# Patient Record
Sex: Female | Born: 1960
Health system: Southern US, Community
[De-identification: ages and names within clinical notes are randomized; demographics above are authoritative.]

## PROBLEM LIST (undated history)

## (undated) DIAGNOSIS — C801 Malignant (primary) neoplasm, unspecified: Secondary | ICD-10-CM

## (undated) DIAGNOSIS — M810 Age-related osteoporosis without current pathological fracture: Secondary | ICD-10-CM

## (undated) DIAGNOSIS — J449 Chronic obstructive pulmonary disease, unspecified: Secondary | ICD-10-CM

## (undated) DIAGNOSIS — G8250 Quadriplegia, unspecified: Secondary | ICD-10-CM

## (undated) DIAGNOSIS — T7840XA Allergy, unspecified, initial encounter: Secondary | ICD-10-CM

## (undated) DIAGNOSIS — J439 Emphysema, unspecified: Secondary | ICD-10-CM

## (undated) HISTORY — DX: Malignant (primary) neoplasm, unspecified: C80.1

## (undated) HISTORY — DX: Age-related osteoporosis without current pathological fracture: M81.0

## (undated) HISTORY — DX: Allergy, unspecified, initial encounter: T78.40XA

## (undated) HISTORY — PX: SPINE SURGERY: SHX786

## (undated) HISTORY — DX: Quadriplegia, unspecified: G82.50

## (undated) HISTORY — PX: CERVICAL SPINE SURGERY: SHX589

## (undated) HISTORY — PX: ABDOMINAL HYSTERECTOMY: SHX81

## (undated) HISTORY — DX: Chronic obstructive pulmonary disease, unspecified: J44.9

## (undated) HISTORY — DX: Emphysema, unspecified: J43.9

---
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
## 2005-06-07 ENCOUNTER — Inpatient Hospital Stay: Payer: Self-pay | Admitting: Internal Medicine

## 2005-06-07 ENCOUNTER — Other Ambulatory Visit: Payer: Self-pay

## 2005-10-20 ENCOUNTER — Ambulatory Visit: Payer: Self-pay

## 2006-02-03 ENCOUNTER — Ambulatory Visit: Payer: Self-pay | Admitting: Internal Medicine

## 2006-05-26 ENCOUNTER — Ambulatory Visit: Payer: Self-pay | Admitting: Internal Medicine

## 2006-09-22 ENCOUNTER — Ambulatory Visit: Payer: Self-pay | Admitting: Internal Medicine

## 2006-10-25 ENCOUNTER — Ambulatory Visit: Payer: Self-pay | Admitting: Internal Medicine

## 2006-11-08 ENCOUNTER — Ambulatory Visit: Payer: Self-pay | Admitting: Specialist

## 2006-12-05 ENCOUNTER — Ambulatory Visit: Payer: Self-pay | Admitting: Specialist

## 2007-05-25 ENCOUNTER — Ambulatory Visit: Payer: Self-pay | Admitting: Internal Medicine

## 2007-10-16 ENCOUNTER — Ambulatory Visit: Payer: Self-pay | Admitting: Internal Medicine

## 2007-11-05 ENCOUNTER — Ambulatory Visit: Payer: Self-pay | Admitting: Internal Medicine

## 2007-11-14 ENCOUNTER — Ambulatory Visit: Payer: Self-pay | Admitting: Internal Medicine

## 2007-12-18 ENCOUNTER — Ambulatory Visit: Payer: Self-pay | Admitting: Internal Medicine

## 2008-06-25 ENCOUNTER — Ambulatory Visit: Payer: Self-pay | Admitting: Internal Medicine

## 2008-10-23 ENCOUNTER — Ambulatory Visit: Payer: Self-pay | Admitting: Internal Medicine

## 2009-12-17 ENCOUNTER — Ambulatory Visit: Payer: Self-pay | Admitting: Internal Medicine

## 2011-01-01 ENCOUNTER — Ambulatory Visit: Payer: Self-pay | Admitting: Internal Medicine

## 2012-01-05 ENCOUNTER — Ambulatory Visit: Payer: Self-pay | Admitting: Internal Medicine

## 2012-07-12 ENCOUNTER — Ambulatory Visit: Payer: Self-pay

## 2013-10-17 ENCOUNTER — Ambulatory Visit: Payer: Self-pay | Admitting: Internal Medicine

## 2014-03-11 DIAGNOSIS — G8250 Quadriplegia, unspecified: Secondary | ICD-10-CM | POA: Diagnosis not present

## 2014-03-11 DIAGNOSIS — G834 Cauda equina syndrome: Secondary | ICD-10-CM | POA: Diagnosis not present

## 2014-03-11 DIAGNOSIS — Z436 Encounter for attention to other artificial openings of urinary tract: Secondary | ICD-10-CM | POA: Diagnosis not present

## 2014-03-11 DIAGNOSIS — R32 Unspecified urinary incontinence: Secondary | ICD-10-CM | POA: Diagnosis not present

## 2014-03-14 DIAGNOSIS — R32 Unspecified urinary incontinence: Secondary | ICD-10-CM | POA: Diagnosis not present

## 2014-03-14 DIAGNOSIS — Z436 Encounter for attention to other artificial openings of urinary tract: Secondary | ICD-10-CM | POA: Diagnosis not present

## 2014-03-14 DIAGNOSIS — G8250 Quadriplegia, unspecified: Secondary | ICD-10-CM | POA: Diagnosis not present

## 2014-03-14 DIAGNOSIS — G834 Cauda equina syndrome: Secondary | ICD-10-CM | POA: Diagnosis not present

## 2014-03-18 DIAGNOSIS — G8250 Quadriplegia, unspecified: Secondary | ICD-10-CM | POA: Diagnosis not present

## 2014-03-18 DIAGNOSIS — G834 Cauda equina syndrome: Secondary | ICD-10-CM | POA: Diagnosis not present

## 2014-03-18 DIAGNOSIS — R32 Unspecified urinary incontinence: Secondary | ICD-10-CM | POA: Diagnosis not present

## 2014-03-18 DIAGNOSIS — Z436 Encounter for attention to other artificial openings of urinary tract: Secondary | ICD-10-CM | POA: Diagnosis not present

## 2014-03-21 DIAGNOSIS — G834 Cauda equina syndrome: Secondary | ICD-10-CM | POA: Diagnosis not present

## 2014-03-21 DIAGNOSIS — Z436 Encounter for attention to other artificial openings of urinary tract: Secondary | ICD-10-CM | POA: Diagnosis not present

## 2014-03-21 DIAGNOSIS — R32 Unspecified urinary incontinence: Secondary | ICD-10-CM | POA: Diagnosis not present

## 2014-03-21 DIAGNOSIS — G8250 Quadriplegia, unspecified: Secondary | ICD-10-CM | POA: Diagnosis not present

## 2014-03-22 DIAGNOSIS — R32 Unspecified urinary incontinence: Secondary | ICD-10-CM | POA: Diagnosis not present

## 2014-03-22 DIAGNOSIS — Z436 Encounter for attention to other artificial openings of urinary tract: Secondary | ICD-10-CM | POA: Diagnosis not present

## 2014-03-22 DIAGNOSIS — G834 Cauda equina syndrome: Secondary | ICD-10-CM | POA: Diagnosis not present

## 2014-03-22 DIAGNOSIS — G8250 Quadriplegia, unspecified: Secondary | ICD-10-CM | POA: Diagnosis not present

## 2014-03-25 DIAGNOSIS — Z436 Encounter for attention to other artificial openings of urinary tract: Secondary | ICD-10-CM | POA: Diagnosis not present

## 2014-03-25 DIAGNOSIS — G8250 Quadriplegia, unspecified: Secondary | ICD-10-CM | POA: Diagnosis not present

## 2014-03-25 DIAGNOSIS — R32 Unspecified urinary incontinence: Secondary | ICD-10-CM | POA: Diagnosis not present

## 2014-03-25 DIAGNOSIS — G834 Cauda equina syndrome: Secondary | ICD-10-CM | POA: Diagnosis not present

## 2014-03-27 DIAGNOSIS — N3946 Mixed incontinence: Secondary | ICD-10-CM | POA: Diagnosis not present

## 2014-03-28 DIAGNOSIS — G8250 Quadriplegia, unspecified: Secondary | ICD-10-CM | POA: Diagnosis not present

## 2014-03-28 DIAGNOSIS — Z436 Encounter for attention to other artificial openings of urinary tract: Secondary | ICD-10-CM | POA: Diagnosis not present

## 2014-03-28 DIAGNOSIS — G834 Cauda equina syndrome: Secondary | ICD-10-CM | POA: Diagnosis not present

## 2014-03-28 DIAGNOSIS — R32 Unspecified urinary incontinence: Secondary | ICD-10-CM | POA: Diagnosis not present

## 2014-04-02 DIAGNOSIS — Z436 Encounter for attention to other artificial openings of urinary tract: Secondary | ICD-10-CM | POA: Diagnosis not present

## 2014-04-02 DIAGNOSIS — G8250 Quadriplegia, unspecified: Secondary | ICD-10-CM | POA: Diagnosis not present

## 2014-04-02 DIAGNOSIS — R32 Unspecified urinary incontinence: Secondary | ICD-10-CM | POA: Diagnosis not present

## 2014-04-02 DIAGNOSIS — G834 Cauda equina syndrome: Secondary | ICD-10-CM | POA: Diagnosis not present

## 2014-04-04 DIAGNOSIS — G834 Cauda equina syndrome: Secondary | ICD-10-CM | POA: Diagnosis not present

## 2014-04-04 DIAGNOSIS — Z436 Encounter for attention to other artificial openings of urinary tract: Secondary | ICD-10-CM | POA: Diagnosis not present

## 2014-04-04 DIAGNOSIS — G8250 Quadriplegia, unspecified: Secondary | ICD-10-CM | POA: Diagnosis not present

## 2014-04-04 DIAGNOSIS — R32 Unspecified urinary incontinence: Secondary | ICD-10-CM | POA: Diagnosis not present

## 2014-04-09 DIAGNOSIS — G834 Cauda equina syndrome: Secondary | ICD-10-CM | POA: Diagnosis not present

## 2014-04-09 DIAGNOSIS — R32 Unspecified urinary incontinence: Secondary | ICD-10-CM | POA: Diagnosis not present

## 2014-04-09 DIAGNOSIS — G8250 Quadriplegia, unspecified: Secondary | ICD-10-CM | POA: Diagnosis not present

## 2014-04-09 DIAGNOSIS — Z436 Encounter for attention to other artificial openings of urinary tract: Secondary | ICD-10-CM | POA: Diagnosis not present

## 2014-04-11 DIAGNOSIS — G834 Cauda equina syndrome: Secondary | ICD-10-CM | POA: Diagnosis not present

## 2014-04-11 DIAGNOSIS — Z436 Encounter for attention to other artificial openings of urinary tract: Secondary | ICD-10-CM | POA: Diagnosis not present

## 2014-04-11 DIAGNOSIS — G8250 Quadriplegia, unspecified: Secondary | ICD-10-CM | POA: Diagnosis not present

## 2014-04-11 DIAGNOSIS — R32 Unspecified urinary incontinence: Secondary | ICD-10-CM | POA: Diagnosis not present

## 2014-04-15 DIAGNOSIS — G8250 Quadriplegia, unspecified: Secondary | ICD-10-CM | POA: Diagnosis not present

## 2014-04-15 DIAGNOSIS — Z436 Encounter for attention to other artificial openings of urinary tract: Secondary | ICD-10-CM | POA: Diagnosis not present

## 2014-04-15 DIAGNOSIS — R32 Unspecified urinary incontinence: Secondary | ICD-10-CM | POA: Diagnosis not present

## 2014-04-15 DIAGNOSIS — G834 Cauda equina syndrome: Secondary | ICD-10-CM | POA: Diagnosis not present

## 2014-04-16 DIAGNOSIS — R32 Unspecified urinary incontinence: Secondary | ICD-10-CM | POA: Diagnosis not present

## 2014-04-16 DIAGNOSIS — G8250 Quadriplegia, unspecified: Secondary | ICD-10-CM | POA: Diagnosis not present

## 2014-04-16 DIAGNOSIS — G834 Cauda equina syndrome: Secondary | ICD-10-CM | POA: Diagnosis not present

## 2014-04-16 DIAGNOSIS — Z436 Encounter for attention to other artificial openings of urinary tract: Secondary | ICD-10-CM | POA: Diagnosis not present

## 2014-04-18 DIAGNOSIS — Z436 Encounter for attention to other artificial openings of urinary tract: Secondary | ICD-10-CM | POA: Diagnosis not present

## 2014-04-18 DIAGNOSIS — R32 Unspecified urinary incontinence: Secondary | ICD-10-CM | POA: Diagnosis not present

## 2014-04-18 DIAGNOSIS — G834 Cauda equina syndrome: Secondary | ICD-10-CM | POA: Diagnosis not present

## 2014-04-18 DIAGNOSIS — G8250 Quadriplegia, unspecified: Secondary | ICD-10-CM | POA: Diagnosis not present

## 2014-04-22 DIAGNOSIS — G834 Cauda equina syndrome: Secondary | ICD-10-CM | POA: Diagnosis not present

## 2014-04-22 DIAGNOSIS — G8250 Quadriplegia, unspecified: Secondary | ICD-10-CM | POA: Diagnosis not present

## 2014-04-22 DIAGNOSIS — Z436 Encounter for attention to other artificial openings of urinary tract: Secondary | ICD-10-CM | POA: Diagnosis not present

## 2014-04-22 DIAGNOSIS — R32 Unspecified urinary incontinence: Secondary | ICD-10-CM | POA: Diagnosis not present

## 2014-04-23 DIAGNOSIS — R32 Unspecified urinary incontinence: Secondary | ICD-10-CM | POA: Diagnosis not present

## 2014-04-23 DIAGNOSIS — Z436 Encounter for attention to other artificial openings of urinary tract: Secondary | ICD-10-CM | POA: Diagnosis not present

## 2014-04-23 DIAGNOSIS — G834 Cauda equina syndrome: Secondary | ICD-10-CM | POA: Diagnosis not present

## 2014-04-23 DIAGNOSIS — G8250 Quadriplegia, unspecified: Secondary | ICD-10-CM | POA: Diagnosis not present

## 2014-04-25 DIAGNOSIS — G834 Cauda equina syndrome: Secondary | ICD-10-CM | POA: Diagnosis not present

## 2014-04-25 DIAGNOSIS — Z436 Encounter for attention to other artificial openings of urinary tract: Secondary | ICD-10-CM | POA: Diagnosis not present

## 2014-04-25 DIAGNOSIS — G8250 Quadriplegia, unspecified: Secondary | ICD-10-CM | POA: Diagnosis not present

## 2014-04-25 DIAGNOSIS — R32 Unspecified urinary incontinence: Secondary | ICD-10-CM | POA: Diagnosis not present

## 2014-04-29 DIAGNOSIS — G8250 Quadriplegia, unspecified: Secondary | ICD-10-CM | POA: Diagnosis not present

## 2014-04-29 DIAGNOSIS — G834 Cauda equina syndrome: Secondary | ICD-10-CM | POA: Diagnosis not present

## 2014-04-29 DIAGNOSIS — R32 Unspecified urinary incontinence: Secondary | ICD-10-CM | POA: Diagnosis not present

## 2014-04-29 DIAGNOSIS — Z436 Encounter for attention to other artificial openings of urinary tract: Secondary | ICD-10-CM | POA: Diagnosis not present

## 2014-05-02 DIAGNOSIS — R32 Unspecified urinary incontinence: Secondary | ICD-10-CM | POA: Diagnosis not present

## 2014-05-02 DIAGNOSIS — Z436 Encounter for attention to other artificial openings of urinary tract: Secondary | ICD-10-CM | POA: Diagnosis not present

## 2014-05-02 DIAGNOSIS — G834 Cauda equina syndrome: Secondary | ICD-10-CM | POA: Diagnosis not present

## 2014-05-02 DIAGNOSIS — G8250 Quadriplegia, unspecified: Secondary | ICD-10-CM | POA: Diagnosis not present

## 2014-05-06 DIAGNOSIS — Z436 Encounter for attention to other artificial openings of urinary tract: Secondary | ICD-10-CM | POA: Diagnosis not present

## 2014-05-06 DIAGNOSIS — R32 Unspecified urinary incontinence: Secondary | ICD-10-CM | POA: Diagnosis not present

## 2014-05-06 DIAGNOSIS — G8250 Quadriplegia, unspecified: Secondary | ICD-10-CM | POA: Diagnosis not present

## 2014-05-06 DIAGNOSIS — G834 Cauda equina syndrome: Secondary | ICD-10-CM | POA: Diagnosis not present

## 2014-05-09 DIAGNOSIS — G8250 Quadriplegia, unspecified: Secondary | ICD-10-CM | POA: Diagnosis not present

## 2014-05-09 DIAGNOSIS — N390 Urinary tract infection, site not specified: Secondary | ICD-10-CM | POA: Diagnosis not present

## 2014-05-09 DIAGNOSIS — Z436 Encounter for attention to other artificial openings of urinary tract: Secondary | ICD-10-CM | POA: Diagnosis not present

## 2014-05-09 DIAGNOSIS — G834 Cauda equina syndrome: Secondary | ICD-10-CM | POA: Diagnosis not present

## 2014-05-09 DIAGNOSIS — R32 Unspecified urinary incontinence: Secondary | ICD-10-CM | POA: Diagnosis not present

## 2014-05-10 DIAGNOSIS — R32 Unspecified urinary incontinence: Secondary | ICD-10-CM | POA: Diagnosis not present

## 2014-05-10 DIAGNOSIS — G8250 Quadriplegia, unspecified: Secondary | ICD-10-CM | POA: Diagnosis not present

## 2014-05-10 DIAGNOSIS — Z436 Encounter for attention to other artificial openings of urinary tract: Secondary | ICD-10-CM | POA: Diagnosis not present

## 2014-05-10 DIAGNOSIS — G834 Cauda equina syndrome: Secondary | ICD-10-CM | POA: Diagnosis not present

## 2014-05-13 DIAGNOSIS — Z436 Encounter for attention to other artificial openings of urinary tract: Secondary | ICD-10-CM | POA: Diagnosis not present

## 2014-05-13 DIAGNOSIS — R32 Unspecified urinary incontinence: Secondary | ICD-10-CM | POA: Diagnosis not present

## 2014-05-13 DIAGNOSIS — G834 Cauda equina syndrome: Secondary | ICD-10-CM | POA: Diagnosis not present

## 2014-05-13 DIAGNOSIS — G8250 Quadriplegia, unspecified: Secondary | ICD-10-CM | POA: Diagnosis not present

## 2014-05-16 DIAGNOSIS — Z436 Encounter for attention to other artificial openings of urinary tract: Secondary | ICD-10-CM | POA: Diagnosis not present

## 2014-05-16 DIAGNOSIS — G834 Cauda equina syndrome: Secondary | ICD-10-CM | POA: Diagnosis not present

## 2014-05-16 DIAGNOSIS — G8250 Quadriplegia, unspecified: Secondary | ICD-10-CM | POA: Diagnosis not present

## 2014-05-16 DIAGNOSIS — R32 Unspecified urinary incontinence: Secondary | ICD-10-CM | POA: Diagnosis not present

## 2014-05-17 DIAGNOSIS — R6889 Other general symptoms and signs: Secondary | ICD-10-CM | POA: Diagnosis not present

## 2014-05-17 DIAGNOSIS — S129XXA Fracture of neck, unspecified, initial encounter: Secondary | ICD-10-CM | POA: Diagnosis not present

## 2014-05-17 DIAGNOSIS — N3010 Interstitial cystitis (chronic) without hematuria: Secondary | ICD-10-CM | POA: Diagnosis not present

## 2014-05-17 DIAGNOSIS — B3741 Candidal cystitis and urethritis: Secondary | ICD-10-CM | POA: Diagnosis not present

## 2014-05-20 DIAGNOSIS — Z436 Encounter for attention to other artificial openings of urinary tract: Secondary | ICD-10-CM | POA: Diagnosis not present

## 2014-05-20 DIAGNOSIS — G8250 Quadriplegia, unspecified: Secondary | ICD-10-CM | POA: Diagnosis not present

## 2014-05-20 DIAGNOSIS — G834 Cauda equina syndrome: Secondary | ICD-10-CM | POA: Diagnosis not present

## 2014-05-20 DIAGNOSIS — R32 Unspecified urinary incontinence: Secondary | ICD-10-CM | POA: Diagnosis not present

## 2014-05-23 DIAGNOSIS — R32 Unspecified urinary incontinence: Secondary | ICD-10-CM | POA: Diagnosis not present

## 2014-05-23 DIAGNOSIS — Z436 Encounter for attention to other artificial openings of urinary tract: Secondary | ICD-10-CM | POA: Diagnosis not present

## 2014-05-23 DIAGNOSIS — G8250 Quadriplegia, unspecified: Secondary | ICD-10-CM | POA: Diagnosis not present

## 2014-05-23 DIAGNOSIS — G834 Cauda equina syndrome: Secondary | ICD-10-CM | POA: Diagnosis not present

## 2014-05-27 DIAGNOSIS — R32 Unspecified urinary incontinence: Secondary | ICD-10-CM | POA: Diagnosis not present

## 2014-05-27 DIAGNOSIS — Z436 Encounter for attention to other artificial openings of urinary tract: Secondary | ICD-10-CM | POA: Diagnosis not present

## 2014-05-27 DIAGNOSIS — G8250 Quadriplegia, unspecified: Secondary | ICD-10-CM | POA: Diagnosis not present

## 2014-05-27 DIAGNOSIS — G834 Cauda equina syndrome: Secondary | ICD-10-CM | POA: Diagnosis not present

## 2014-05-30 DIAGNOSIS — Z436 Encounter for attention to other artificial openings of urinary tract: Secondary | ICD-10-CM | POA: Diagnosis not present

## 2014-05-30 DIAGNOSIS — R32 Unspecified urinary incontinence: Secondary | ICD-10-CM | POA: Diagnosis not present

## 2014-05-30 DIAGNOSIS — G834 Cauda equina syndrome: Secondary | ICD-10-CM | POA: Diagnosis not present

## 2014-05-30 DIAGNOSIS — G8250 Quadriplegia, unspecified: Secondary | ICD-10-CM | POA: Diagnosis not present

## 2014-06-03 DIAGNOSIS — G834 Cauda equina syndrome: Secondary | ICD-10-CM | POA: Diagnosis not present

## 2014-06-03 DIAGNOSIS — R32 Unspecified urinary incontinence: Secondary | ICD-10-CM | POA: Diagnosis not present

## 2014-06-03 DIAGNOSIS — Z436 Encounter for attention to other artificial openings of urinary tract: Secondary | ICD-10-CM | POA: Diagnosis not present

## 2014-06-03 DIAGNOSIS — G8250 Quadriplegia, unspecified: Secondary | ICD-10-CM | POA: Diagnosis not present

## 2014-06-06 DIAGNOSIS — G834 Cauda equina syndrome: Secondary | ICD-10-CM | POA: Diagnosis not present

## 2014-06-06 DIAGNOSIS — G8250 Quadriplegia, unspecified: Secondary | ICD-10-CM | POA: Diagnosis not present

## 2014-06-06 DIAGNOSIS — Z436 Encounter for attention to other artificial openings of urinary tract: Secondary | ICD-10-CM | POA: Diagnosis not present

## 2014-06-06 DIAGNOSIS — R32 Unspecified urinary incontinence: Secondary | ICD-10-CM | POA: Diagnosis not present

## 2014-06-10 DIAGNOSIS — Z436 Encounter for attention to other artificial openings of urinary tract: Secondary | ICD-10-CM | POA: Diagnosis not present

## 2014-06-10 DIAGNOSIS — G8250 Quadriplegia, unspecified: Secondary | ICD-10-CM | POA: Diagnosis not present

## 2014-06-10 DIAGNOSIS — G834 Cauda equina syndrome: Secondary | ICD-10-CM | POA: Diagnosis not present

## 2014-06-10 DIAGNOSIS — R32 Unspecified urinary incontinence: Secondary | ICD-10-CM | POA: Diagnosis not present

## 2014-06-13 DIAGNOSIS — G8250 Quadriplegia, unspecified: Secondary | ICD-10-CM | POA: Diagnosis not present

## 2014-06-13 DIAGNOSIS — R32 Unspecified urinary incontinence: Secondary | ICD-10-CM | POA: Diagnosis not present

## 2014-06-13 DIAGNOSIS — Z436 Encounter for attention to other artificial openings of urinary tract: Secondary | ICD-10-CM | POA: Diagnosis not present

## 2014-06-13 DIAGNOSIS — G834 Cauda equina syndrome: Secondary | ICD-10-CM | POA: Diagnosis not present

## 2014-06-17 DIAGNOSIS — R6889 Other general symptoms and signs: Secondary | ICD-10-CM | POA: Diagnosis not present

## 2014-06-17 DIAGNOSIS — G834 Cauda equina syndrome: Secondary | ICD-10-CM | POA: Diagnosis not present

## 2014-06-17 DIAGNOSIS — G8250 Quadriplegia, unspecified: Secondary | ICD-10-CM | POA: Diagnosis not present

## 2014-06-17 DIAGNOSIS — R32 Unspecified urinary incontinence: Secondary | ICD-10-CM | POA: Diagnosis not present

## 2014-06-17 DIAGNOSIS — Z436 Encounter for attention to other artificial openings of urinary tract: Secondary | ICD-10-CM | POA: Diagnosis not present

## 2014-06-19 DIAGNOSIS — R32 Unspecified urinary incontinence: Secondary | ICD-10-CM | POA: Diagnosis not present

## 2014-06-19 DIAGNOSIS — Z436 Encounter for attention to other artificial openings of urinary tract: Secondary | ICD-10-CM | POA: Diagnosis not present

## 2014-06-19 DIAGNOSIS — G834 Cauda equina syndrome: Secondary | ICD-10-CM | POA: Diagnosis not present

## 2014-06-19 DIAGNOSIS — G8250 Quadriplegia, unspecified: Secondary | ICD-10-CM | POA: Diagnosis not present

## 2014-06-20 DIAGNOSIS — G8250 Quadriplegia, unspecified: Secondary | ICD-10-CM | POA: Diagnosis not present

## 2014-06-20 DIAGNOSIS — Z436 Encounter for attention to other artificial openings of urinary tract: Secondary | ICD-10-CM | POA: Diagnosis not present

## 2014-06-20 DIAGNOSIS — G834 Cauda equina syndrome: Secondary | ICD-10-CM | POA: Diagnosis not present

## 2014-06-20 DIAGNOSIS — R32 Unspecified urinary incontinence: Secondary | ICD-10-CM | POA: Diagnosis not present

## 2014-06-24 DIAGNOSIS — R32 Unspecified urinary incontinence: Secondary | ICD-10-CM | POA: Diagnosis not present

## 2014-06-24 DIAGNOSIS — Z436 Encounter for attention to other artificial openings of urinary tract: Secondary | ICD-10-CM | POA: Diagnosis not present

## 2014-06-24 DIAGNOSIS — G834 Cauda equina syndrome: Secondary | ICD-10-CM | POA: Diagnosis not present

## 2014-06-24 DIAGNOSIS — G8250 Quadriplegia, unspecified: Secondary | ICD-10-CM | POA: Diagnosis not present

## 2014-06-27 DIAGNOSIS — G834 Cauda equina syndrome: Secondary | ICD-10-CM | POA: Diagnosis not present

## 2014-06-27 DIAGNOSIS — G8250 Quadriplegia, unspecified: Secondary | ICD-10-CM | POA: Diagnosis not present

## 2014-06-27 DIAGNOSIS — R32 Unspecified urinary incontinence: Secondary | ICD-10-CM | POA: Diagnosis not present

## 2014-06-27 DIAGNOSIS — Z436 Encounter for attention to other artificial openings of urinary tract: Secondary | ICD-10-CM | POA: Diagnosis not present

## 2014-07-08 DIAGNOSIS — Z436 Encounter for attention to other artificial openings of urinary tract: Secondary | ICD-10-CM | POA: Diagnosis not present

## 2014-07-08 DIAGNOSIS — G8250 Quadriplegia, unspecified: Secondary | ICD-10-CM | POA: Diagnosis not present

## 2014-07-08 DIAGNOSIS — G834 Cauda equina syndrome: Secondary | ICD-10-CM | POA: Diagnosis not present

## 2014-07-08 DIAGNOSIS — R32 Unspecified urinary incontinence: Secondary | ICD-10-CM | POA: Diagnosis not present

## 2014-07-09 DIAGNOSIS — G8250 Quadriplegia, unspecified: Secondary | ICD-10-CM | POA: Diagnosis not present

## 2014-07-09 DIAGNOSIS — G834 Cauda equina syndrome: Secondary | ICD-10-CM | POA: Diagnosis not present

## 2014-07-09 DIAGNOSIS — Z436 Encounter for attention to other artificial openings of urinary tract: Secondary | ICD-10-CM | POA: Diagnosis not present

## 2014-07-09 DIAGNOSIS — R32 Unspecified urinary incontinence: Secondary | ICD-10-CM | POA: Diagnosis not present

## 2014-07-11 DIAGNOSIS — G8250 Quadriplegia, unspecified: Secondary | ICD-10-CM | POA: Diagnosis not present

## 2014-07-11 DIAGNOSIS — G834 Cauda equina syndrome: Secondary | ICD-10-CM | POA: Diagnosis not present

## 2014-07-11 DIAGNOSIS — R32 Unspecified urinary incontinence: Secondary | ICD-10-CM | POA: Diagnosis not present

## 2014-07-11 DIAGNOSIS — Z436 Encounter for attention to other artificial openings of urinary tract: Secondary | ICD-10-CM | POA: Diagnosis not present

## 2014-07-16 DIAGNOSIS — G834 Cauda equina syndrome: Secondary | ICD-10-CM | POA: Diagnosis not present

## 2014-07-16 DIAGNOSIS — G8250 Quadriplegia, unspecified: Secondary | ICD-10-CM | POA: Diagnosis not present

## 2014-07-16 DIAGNOSIS — R32 Unspecified urinary incontinence: Secondary | ICD-10-CM | POA: Diagnosis not present

## 2014-07-16 DIAGNOSIS — Z436 Encounter for attention to other artificial openings of urinary tract: Secondary | ICD-10-CM | POA: Diagnosis not present

## 2014-07-18 DIAGNOSIS — G834 Cauda equina syndrome: Secondary | ICD-10-CM | POA: Diagnosis not present

## 2014-07-18 DIAGNOSIS — G8250 Quadriplegia, unspecified: Secondary | ICD-10-CM | POA: Diagnosis not present

## 2014-07-18 DIAGNOSIS — R32 Unspecified urinary incontinence: Secondary | ICD-10-CM | POA: Diagnosis not present

## 2014-07-18 DIAGNOSIS — Z436 Encounter for attention to other artificial openings of urinary tract: Secondary | ICD-10-CM | POA: Diagnosis not present

## 2014-07-22 DIAGNOSIS — G8250 Quadriplegia, unspecified: Secondary | ICD-10-CM | POA: Diagnosis not present

## 2014-07-22 DIAGNOSIS — G834 Cauda equina syndrome: Secondary | ICD-10-CM | POA: Diagnosis not present

## 2014-07-22 DIAGNOSIS — R32 Unspecified urinary incontinence: Secondary | ICD-10-CM | POA: Diagnosis not present

## 2014-07-22 DIAGNOSIS — Z436 Encounter for attention to other artificial openings of urinary tract: Secondary | ICD-10-CM | POA: Diagnosis not present

## 2014-07-25 DIAGNOSIS — G834 Cauda equina syndrome: Secondary | ICD-10-CM | POA: Diagnosis not present

## 2014-07-25 DIAGNOSIS — G8250 Quadriplegia, unspecified: Secondary | ICD-10-CM | POA: Diagnosis not present

## 2014-07-25 DIAGNOSIS — Z436 Encounter for attention to other artificial openings of urinary tract: Secondary | ICD-10-CM | POA: Diagnosis not present

## 2014-07-25 DIAGNOSIS — R32 Unspecified urinary incontinence: Secondary | ICD-10-CM | POA: Diagnosis not present

## 2014-07-29 DIAGNOSIS — G834 Cauda equina syndrome: Secondary | ICD-10-CM | POA: Diagnosis not present

## 2014-07-29 DIAGNOSIS — R32 Unspecified urinary incontinence: Secondary | ICD-10-CM | POA: Diagnosis not present

## 2014-07-29 DIAGNOSIS — Z436 Encounter for attention to other artificial openings of urinary tract: Secondary | ICD-10-CM | POA: Diagnosis not present

## 2014-07-29 DIAGNOSIS — G8250 Quadriplegia, unspecified: Secondary | ICD-10-CM | POA: Diagnosis not present

## 2014-07-30 DIAGNOSIS — Z436 Encounter for attention to other artificial openings of urinary tract: Secondary | ICD-10-CM | POA: Diagnosis not present

## 2014-07-30 DIAGNOSIS — G8250 Quadriplegia, unspecified: Secondary | ICD-10-CM | POA: Diagnosis not present

## 2014-07-30 DIAGNOSIS — G834 Cauda equina syndrome: Secondary | ICD-10-CM | POA: Diagnosis not present

## 2014-07-30 DIAGNOSIS — R32 Unspecified urinary incontinence: Secondary | ICD-10-CM | POA: Diagnosis not present

## 2014-08-01 DIAGNOSIS — G8250 Quadriplegia, unspecified: Secondary | ICD-10-CM | POA: Diagnosis not present

## 2014-08-01 DIAGNOSIS — R32 Unspecified urinary incontinence: Secondary | ICD-10-CM | POA: Diagnosis not present

## 2014-08-01 DIAGNOSIS — Z436 Encounter for attention to other artificial openings of urinary tract: Secondary | ICD-10-CM | POA: Diagnosis not present

## 2014-08-01 DIAGNOSIS — G834 Cauda equina syndrome: Secondary | ICD-10-CM | POA: Diagnosis not present

## 2014-08-05 DIAGNOSIS — G834 Cauda equina syndrome: Secondary | ICD-10-CM | POA: Diagnosis not present

## 2014-08-05 DIAGNOSIS — R32 Unspecified urinary incontinence: Secondary | ICD-10-CM | POA: Diagnosis not present

## 2014-08-05 DIAGNOSIS — G8250 Quadriplegia, unspecified: Secondary | ICD-10-CM | POA: Diagnosis not present

## 2014-08-05 DIAGNOSIS — Z436 Encounter for attention to other artificial openings of urinary tract: Secondary | ICD-10-CM | POA: Diagnosis not present

## 2014-08-08 DIAGNOSIS — R32 Unspecified urinary incontinence: Secondary | ICD-10-CM | POA: Diagnosis not present

## 2014-08-08 DIAGNOSIS — Z436 Encounter for attention to other artificial openings of urinary tract: Secondary | ICD-10-CM | POA: Diagnosis not present

## 2014-08-08 DIAGNOSIS — G8250 Quadriplegia, unspecified: Secondary | ICD-10-CM | POA: Diagnosis not present

## 2014-08-08 DIAGNOSIS — G834 Cauda equina syndrome: Secondary | ICD-10-CM | POA: Diagnosis not present

## 2014-08-12 DIAGNOSIS — G8250 Quadriplegia, unspecified: Secondary | ICD-10-CM | POA: Diagnosis not present

## 2014-08-12 DIAGNOSIS — G834 Cauda equina syndrome: Secondary | ICD-10-CM | POA: Diagnosis not present

## 2014-08-12 DIAGNOSIS — R32 Unspecified urinary incontinence: Secondary | ICD-10-CM | POA: Diagnosis not present

## 2014-08-12 DIAGNOSIS — Z436 Encounter for attention to other artificial openings of urinary tract: Secondary | ICD-10-CM | POA: Diagnosis not present

## 2014-08-15 DIAGNOSIS — Z436 Encounter for attention to other artificial openings of urinary tract: Secondary | ICD-10-CM | POA: Diagnosis not present

## 2014-08-15 DIAGNOSIS — G8250 Quadriplegia, unspecified: Secondary | ICD-10-CM | POA: Diagnosis not present

## 2014-08-15 DIAGNOSIS — R32 Unspecified urinary incontinence: Secondary | ICD-10-CM | POA: Diagnosis not present

## 2014-08-15 DIAGNOSIS — G834 Cauda equina syndrome: Secondary | ICD-10-CM | POA: Diagnosis not present

## 2014-08-19 DIAGNOSIS — G834 Cauda equina syndrome: Secondary | ICD-10-CM | POA: Diagnosis not present

## 2014-08-19 DIAGNOSIS — R32 Unspecified urinary incontinence: Secondary | ICD-10-CM | POA: Diagnosis not present

## 2014-08-19 DIAGNOSIS — G8250 Quadriplegia, unspecified: Secondary | ICD-10-CM | POA: Diagnosis not present

## 2014-08-19 DIAGNOSIS — Z436 Encounter for attention to other artificial openings of urinary tract: Secondary | ICD-10-CM | POA: Diagnosis not present

## 2014-08-21 DIAGNOSIS — R32 Unspecified urinary incontinence: Secondary | ICD-10-CM | POA: Diagnosis not present

## 2014-08-21 DIAGNOSIS — G834 Cauda equina syndrome: Secondary | ICD-10-CM | POA: Diagnosis not present

## 2014-08-21 DIAGNOSIS — Z436 Encounter for attention to other artificial openings of urinary tract: Secondary | ICD-10-CM | POA: Diagnosis not present

## 2014-08-21 DIAGNOSIS — G8250 Quadriplegia, unspecified: Secondary | ICD-10-CM | POA: Diagnosis not present

## 2014-08-22 DIAGNOSIS — G8250 Quadriplegia, unspecified: Secondary | ICD-10-CM | POA: Diagnosis not present

## 2014-08-22 DIAGNOSIS — Z436 Encounter for attention to other artificial openings of urinary tract: Secondary | ICD-10-CM | POA: Diagnosis not present

## 2014-08-22 DIAGNOSIS — G834 Cauda equina syndrome: Secondary | ICD-10-CM | POA: Diagnosis not present

## 2014-08-22 DIAGNOSIS — R32 Unspecified urinary incontinence: Secondary | ICD-10-CM | POA: Diagnosis not present

## 2014-08-26 DIAGNOSIS — R32 Unspecified urinary incontinence: Secondary | ICD-10-CM | POA: Diagnosis not present

## 2014-08-26 DIAGNOSIS — G834 Cauda equina syndrome: Secondary | ICD-10-CM | POA: Diagnosis not present

## 2014-08-26 DIAGNOSIS — G8250 Quadriplegia, unspecified: Secondary | ICD-10-CM | POA: Diagnosis not present

## 2014-08-26 DIAGNOSIS — Z436 Encounter for attention to other artificial openings of urinary tract: Secondary | ICD-10-CM | POA: Diagnosis not present

## 2014-08-29 DIAGNOSIS — G834 Cauda equina syndrome: Secondary | ICD-10-CM | POA: Diagnosis not present

## 2014-08-29 DIAGNOSIS — R32 Unspecified urinary incontinence: Secondary | ICD-10-CM | POA: Diagnosis not present

## 2014-08-29 DIAGNOSIS — G8250 Quadriplegia, unspecified: Secondary | ICD-10-CM | POA: Diagnosis not present

## 2014-08-29 DIAGNOSIS — Z436 Encounter for attention to other artificial openings of urinary tract: Secondary | ICD-10-CM | POA: Diagnosis not present

## 2014-09-02 DIAGNOSIS — Z436 Encounter for attention to other artificial openings of urinary tract: Secondary | ICD-10-CM | POA: Diagnosis not present

## 2014-09-02 DIAGNOSIS — R32 Unspecified urinary incontinence: Secondary | ICD-10-CM | POA: Diagnosis not present

## 2014-09-02 DIAGNOSIS — G834 Cauda equina syndrome: Secondary | ICD-10-CM | POA: Diagnosis not present

## 2014-09-02 DIAGNOSIS — G8250 Quadriplegia, unspecified: Secondary | ICD-10-CM | POA: Diagnosis not present

## 2014-09-07 DIAGNOSIS — G8250 Quadriplegia, unspecified: Secondary | ICD-10-CM | POA: Diagnosis not present

## 2014-09-07 DIAGNOSIS — G834 Cauda equina syndrome: Secondary | ICD-10-CM | POA: Diagnosis not present

## 2014-09-07 DIAGNOSIS — R32 Unspecified urinary incontinence: Secondary | ICD-10-CM | POA: Diagnosis not present

## 2014-09-07 DIAGNOSIS — Z436 Encounter for attention to other artificial openings of urinary tract: Secondary | ICD-10-CM | POA: Diagnosis not present

## 2014-09-09 DIAGNOSIS — G834 Cauda equina syndrome: Secondary | ICD-10-CM | POA: Diagnosis not present

## 2014-09-09 DIAGNOSIS — G8250 Quadriplegia, unspecified: Secondary | ICD-10-CM | POA: Diagnosis not present

## 2014-09-09 DIAGNOSIS — R32 Unspecified urinary incontinence: Secondary | ICD-10-CM | POA: Diagnosis not present

## 2014-09-09 DIAGNOSIS — Z436 Encounter for attention to other artificial openings of urinary tract: Secondary | ICD-10-CM | POA: Diagnosis not present

## 2014-09-12 DIAGNOSIS — G834 Cauda equina syndrome: Secondary | ICD-10-CM | POA: Diagnosis not present

## 2014-09-12 DIAGNOSIS — R32 Unspecified urinary incontinence: Secondary | ICD-10-CM | POA: Diagnosis not present

## 2014-09-12 DIAGNOSIS — G8250 Quadriplegia, unspecified: Secondary | ICD-10-CM | POA: Diagnosis not present

## 2014-09-12 DIAGNOSIS — Z436 Encounter for attention to other artificial openings of urinary tract: Secondary | ICD-10-CM | POA: Diagnosis not present

## 2014-09-13 DIAGNOSIS — E784 Other hyperlipidemia: Secondary | ICD-10-CM | POA: Diagnosis not present

## 2014-09-13 DIAGNOSIS — I10 Essential (primary) hypertension: Secondary | ICD-10-CM | POA: Diagnosis not present

## 2014-09-13 DIAGNOSIS — B3741 Candidal cystitis and urethritis: Secondary | ICD-10-CM | POA: Diagnosis not present

## 2014-09-13 DIAGNOSIS — S129XXA Fracture of neck, unspecified, initial encounter: Secondary | ICD-10-CM | POA: Diagnosis not present

## 2014-09-13 DIAGNOSIS — N3010 Interstitial cystitis (chronic) without hematuria: Secondary | ICD-10-CM | POA: Diagnosis not present

## 2014-09-13 DIAGNOSIS — Z0000 Encounter for general adult medical examination without abnormal findings: Secondary | ICD-10-CM | POA: Diagnosis not present

## 2014-09-13 DIAGNOSIS — R6889 Other general symptoms and signs: Secondary | ICD-10-CM | POA: Diagnosis not present

## 2014-09-16 ENCOUNTER — Other Ambulatory Visit: Payer: Self-pay | Admitting: Internal Medicine

## 2014-09-16 DIAGNOSIS — Z436 Encounter for attention to other artificial openings of urinary tract: Secondary | ICD-10-CM | POA: Diagnosis not present

## 2014-09-16 DIAGNOSIS — Z1231 Encounter for screening mammogram for malignant neoplasm of breast: Secondary | ICD-10-CM

## 2014-09-16 DIAGNOSIS — G834 Cauda equina syndrome: Secondary | ICD-10-CM | POA: Diagnosis not present

## 2014-09-16 DIAGNOSIS — R32 Unspecified urinary incontinence: Secondary | ICD-10-CM | POA: Diagnosis not present

## 2014-09-16 DIAGNOSIS — G8250 Quadriplegia, unspecified: Secondary | ICD-10-CM | POA: Diagnosis not present

## 2014-09-19 DIAGNOSIS — G8250 Quadriplegia, unspecified: Secondary | ICD-10-CM | POA: Diagnosis not present

## 2014-09-19 DIAGNOSIS — R32 Unspecified urinary incontinence: Secondary | ICD-10-CM | POA: Diagnosis not present

## 2014-09-19 DIAGNOSIS — G834 Cauda equina syndrome: Secondary | ICD-10-CM | POA: Diagnosis not present

## 2014-09-19 DIAGNOSIS — Z436 Encounter for attention to other artificial openings of urinary tract: Secondary | ICD-10-CM | POA: Diagnosis not present

## 2014-09-23 DIAGNOSIS — G8250 Quadriplegia, unspecified: Secondary | ICD-10-CM | POA: Diagnosis not present

## 2014-09-23 DIAGNOSIS — Z436 Encounter for attention to other artificial openings of urinary tract: Secondary | ICD-10-CM | POA: Diagnosis not present

## 2014-09-23 DIAGNOSIS — R32 Unspecified urinary incontinence: Secondary | ICD-10-CM | POA: Diagnosis not present

## 2014-09-23 DIAGNOSIS — G834 Cauda equina syndrome: Secondary | ICD-10-CM | POA: Diagnosis not present

## 2014-09-26 DIAGNOSIS — Z436 Encounter for attention to other artificial openings of urinary tract: Secondary | ICD-10-CM | POA: Diagnosis not present

## 2014-09-26 DIAGNOSIS — G8250 Quadriplegia, unspecified: Secondary | ICD-10-CM | POA: Diagnosis not present

## 2014-09-26 DIAGNOSIS — G834 Cauda equina syndrome: Secondary | ICD-10-CM | POA: Diagnosis not present

## 2014-09-26 DIAGNOSIS — R32 Unspecified urinary incontinence: Secondary | ICD-10-CM | POA: Diagnosis not present

## 2014-09-30 DIAGNOSIS — G834 Cauda equina syndrome: Secondary | ICD-10-CM | POA: Diagnosis not present

## 2014-09-30 DIAGNOSIS — R32 Unspecified urinary incontinence: Secondary | ICD-10-CM | POA: Diagnosis not present

## 2014-09-30 DIAGNOSIS — G8250 Quadriplegia, unspecified: Secondary | ICD-10-CM | POA: Diagnosis not present

## 2014-09-30 DIAGNOSIS — Z436 Encounter for attention to other artificial openings of urinary tract: Secondary | ICD-10-CM | POA: Diagnosis not present

## 2014-10-02 DIAGNOSIS — G834 Cauda equina syndrome: Secondary | ICD-10-CM | POA: Diagnosis not present

## 2014-10-02 DIAGNOSIS — Z436 Encounter for attention to other artificial openings of urinary tract: Secondary | ICD-10-CM | POA: Diagnosis not present

## 2014-10-02 DIAGNOSIS — G8250 Quadriplegia, unspecified: Secondary | ICD-10-CM | POA: Diagnosis not present

## 2014-10-02 DIAGNOSIS — R32 Unspecified urinary incontinence: Secondary | ICD-10-CM | POA: Diagnosis not present

## 2014-10-03 DIAGNOSIS — R32 Unspecified urinary incontinence: Secondary | ICD-10-CM | POA: Diagnosis not present

## 2014-10-03 DIAGNOSIS — G8250 Quadriplegia, unspecified: Secondary | ICD-10-CM | POA: Diagnosis not present

## 2014-10-03 DIAGNOSIS — Z436 Encounter for attention to other artificial openings of urinary tract: Secondary | ICD-10-CM | POA: Diagnosis not present

## 2014-10-03 DIAGNOSIS — G834 Cauda equina syndrome: Secondary | ICD-10-CM | POA: Diagnosis not present

## 2014-10-07 DIAGNOSIS — R32 Unspecified urinary incontinence: Secondary | ICD-10-CM | POA: Diagnosis not present

## 2014-10-07 DIAGNOSIS — G834 Cauda equina syndrome: Secondary | ICD-10-CM | POA: Diagnosis not present

## 2014-10-07 DIAGNOSIS — Z436 Encounter for attention to other artificial openings of urinary tract: Secondary | ICD-10-CM | POA: Diagnosis not present

## 2014-10-07 DIAGNOSIS — G8250 Quadriplegia, unspecified: Secondary | ICD-10-CM | POA: Diagnosis not present

## 2014-10-10 DIAGNOSIS — G8250 Quadriplegia, unspecified: Secondary | ICD-10-CM | POA: Diagnosis not present

## 2014-10-10 DIAGNOSIS — Z436 Encounter for attention to other artificial openings of urinary tract: Secondary | ICD-10-CM | POA: Diagnosis not present

## 2014-10-10 DIAGNOSIS — R32 Unspecified urinary incontinence: Secondary | ICD-10-CM | POA: Diagnosis not present

## 2014-10-10 DIAGNOSIS — G834 Cauda equina syndrome: Secondary | ICD-10-CM | POA: Diagnosis not present

## 2014-10-14 DIAGNOSIS — G834 Cauda equina syndrome: Secondary | ICD-10-CM | POA: Diagnosis not present

## 2014-10-14 DIAGNOSIS — Z436 Encounter for attention to other artificial openings of urinary tract: Secondary | ICD-10-CM | POA: Diagnosis not present

## 2014-10-14 DIAGNOSIS — G8250 Quadriplegia, unspecified: Secondary | ICD-10-CM | POA: Diagnosis not present

## 2014-10-14 DIAGNOSIS — R32 Unspecified urinary incontinence: Secondary | ICD-10-CM | POA: Diagnosis not present

## 2014-10-15 DIAGNOSIS — R32 Unspecified urinary incontinence: Secondary | ICD-10-CM | POA: Diagnosis not present

## 2014-10-15 DIAGNOSIS — G8250 Quadriplegia, unspecified: Secondary | ICD-10-CM | POA: Diagnosis not present

## 2014-10-15 DIAGNOSIS — Z436 Encounter for attention to other artificial openings of urinary tract: Secondary | ICD-10-CM | POA: Diagnosis not present

## 2014-10-15 DIAGNOSIS — G834 Cauda equina syndrome: Secondary | ICD-10-CM | POA: Diagnosis not present

## 2014-10-16 DIAGNOSIS — L03032 Cellulitis of left toe: Secondary | ICD-10-CM | POA: Diagnosis not present

## 2014-10-16 DIAGNOSIS — R6889 Other general symptoms and signs: Secondary | ICD-10-CM | POA: Diagnosis not present

## 2014-10-16 DIAGNOSIS — L089 Local infection of the skin and subcutaneous tissue, unspecified: Secondary | ICD-10-CM | POA: Diagnosis not present

## 2014-10-17 DIAGNOSIS — R32 Unspecified urinary incontinence: Secondary | ICD-10-CM | POA: Diagnosis not present

## 2014-10-17 DIAGNOSIS — G8250 Quadriplegia, unspecified: Secondary | ICD-10-CM | POA: Diagnosis not present

## 2014-10-17 DIAGNOSIS — G834 Cauda equina syndrome: Secondary | ICD-10-CM | POA: Diagnosis not present

## 2014-10-17 DIAGNOSIS — Z436 Encounter for attention to other artificial openings of urinary tract: Secondary | ICD-10-CM | POA: Diagnosis not present

## 2014-10-20 DIAGNOSIS — G8250 Quadriplegia, unspecified: Secondary | ICD-10-CM | POA: Diagnosis not present

## 2014-10-20 DIAGNOSIS — G834 Cauda equina syndrome: Secondary | ICD-10-CM | POA: Diagnosis not present

## 2014-10-20 DIAGNOSIS — Z436 Encounter for attention to other artificial openings of urinary tract: Secondary | ICD-10-CM | POA: Diagnosis not present

## 2014-10-20 DIAGNOSIS — R32 Unspecified urinary incontinence: Secondary | ICD-10-CM | POA: Diagnosis not present

## 2014-10-22 DIAGNOSIS — R32 Unspecified urinary incontinence: Secondary | ICD-10-CM | POA: Diagnosis not present

## 2014-10-22 DIAGNOSIS — Z436 Encounter for attention to other artificial openings of urinary tract: Secondary | ICD-10-CM | POA: Diagnosis not present

## 2014-10-22 DIAGNOSIS — G8250 Quadriplegia, unspecified: Secondary | ICD-10-CM | POA: Diagnosis not present

## 2014-10-22 DIAGNOSIS — G834 Cauda equina syndrome: Secondary | ICD-10-CM | POA: Diagnosis not present

## 2014-10-24 DIAGNOSIS — G8250 Quadriplegia, unspecified: Secondary | ICD-10-CM | POA: Diagnosis not present

## 2014-10-24 DIAGNOSIS — R32 Unspecified urinary incontinence: Secondary | ICD-10-CM | POA: Diagnosis not present

## 2014-10-24 DIAGNOSIS — G834 Cauda equina syndrome: Secondary | ICD-10-CM | POA: Diagnosis not present

## 2014-10-24 DIAGNOSIS — Z436 Encounter for attention to other artificial openings of urinary tract: Secondary | ICD-10-CM | POA: Diagnosis not present

## 2014-10-28 DIAGNOSIS — R32 Unspecified urinary incontinence: Secondary | ICD-10-CM | POA: Diagnosis not present

## 2014-10-28 DIAGNOSIS — G834 Cauda equina syndrome: Secondary | ICD-10-CM | POA: Diagnosis not present

## 2014-10-28 DIAGNOSIS — G8250 Quadriplegia, unspecified: Secondary | ICD-10-CM | POA: Diagnosis not present

## 2014-10-28 DIAGNOSIS — Z436 Encounter for attention to other artificial openings of urinary tract: Secondary | ICD-10-CM | POA: Diagnosis not present

## 2014-10-31 DIAGNOSIS — R32 Unspecified urinary incontinence: Secondary | ICD-10-CM | POA: Diagnosis not present

## 2014-10-31 DIAGNOSIS — G8250 Quadriplegia, unspecified: Secondary | ICD-10-CM | POA: Diagnosis not present

## 2014-10-31 DIAGNOSIS — Z436 Encounter for attention to other artificial openings of urinary tract: Secondary | ICD-10-CM | POA: Diagnosis not present

## 2014-10-31 DIAGNOSIS — G834 Cauda equina syndrome: Secondary | ICD-10-CM | POA: Diagnosis not present

## 2014-11-04 DIAGNOSIS — Z436 Encounter for attention to other artificial openings of urinary tract: Secondary | ICD-10-CM | POA: Diagnosis not present

## 2014-11-04 DIAGNOSIS — G834 Cauda equina syndrome: Secondary | ICD-10-CM | POA: Diagnosis not present

## 2014-11-04 DIAGNOSIS — R32 Unspecified urinary incontinence: Secondary | ICD-10-CM | POA: Diagnosis not present

## 2014-11-04 DIAGNOSIS — G8250 Quadriplegia, unspecified: Secondary | ICD-10-CM | POA: Diagnosis not present

## 2014-11-06 DIAGNOSIS — R32 Unspecified urinary incontinence: Secondary | ICD-10-CM | POA: Diagnosis not present

## 2014-11-06 DIAGNOSIS — G834 Cauda equina syndrome: Secondary | ICD-10-CM | POA: Diagnosis not present

## 2014-11-06 DIAGNOSIS — Z436 Encounter for attention to other artificial openings of urinary tract: Secondary | ICD-10-CM | POA: Diagnosis not present

## 2014-11-06 DIAGNOSIS — G8250 Quadriplegia, unspecified: Secondary | ICD-10-CM | POA: Diagnosis not present

## 2014-11-07 DIAGNOSIS — G8250 Quadriplegia, unspecified: Secondary | ICD-10-CM | POA: Diagnosis not present

## 2014-11-07 DIAGNOSIS — Z436 Encounter for attention to other artificial openings of urinary tract: Secondary | ICD-10-CM | POA: Diagnosis not present

## 2014-11-07 DIAGNOSIS — R32 Unspecified urinary incontinence: Secondary | ICD-10-CM | POA: Diagnosis not present

## 2014-11-07 DIAGNOSIS — G834 Cauda equina syndrome: Secondary | ICD-10-CM | POA: Diagnosis not present

## 2014-11-12 DIAGNOSIS — Z436 Encounter for attention to other artificial openings of urinary tract: Secondary | ICD-10-CM | POA: Diagnosis not present

## 2014-11-12 DIAGNOSIS — G8250 Quadriplegia, unspecified: Secondary | ICD-10-CM | POA: Diagnosis not present

## 2014-11-12 DIAGNOSIS — R32 Unspecified urinary incontinence: Secondary | ICD-10-CM | POA: Diagnosis not present

## 2014-11-12 DIAGNOSIS — G834 Cauda equina syndrome: Secondary | ICD-10-CM | POA: Diagnosis not present

## 2014-11-13 DIAGNOSIS — G834 Cauda equina syndrome: Secondary | ICD-10-CM | POA: Diagnosis not present

## 2014-11-13 DIAGNOSIS — Z436 Encounter for attention to other artificial openings of urinary tract: Secondary | ICD-10-CM | POA: Diagnosis not present

## 2014-11-13 DIAGNOSIS — G8250 Quadriplegia, unspecified: Secondary | ICD-10-CM | POA: Diagnosis not present

## 2014-11-13 DIAGNOSIS — R32 Unspecified urinary incontinence: Secondary | ICD-10-CM | POA: Diagnosis not present

## 2014-11-19 DIAGNOSIS — G834 Cauda equina syndrome: Secondary | ICD-10-CM | POA: Diagnosis not present

## 2014-11-19 DIAGNOSIS — R32 Unspecified urinary incontinence: Secondary | ICD-10-CM | POA: Diagnosis not present

## 2014-11-19 DIAGNOSIS — Z436 Encounter for attention to other artificial openings of urinary tract: Secondary | ICD-10-CM | POA: Diagnosis not present

## 2014-11-19 DIAGNOSIS — G8250 Quadriplegia, unspecified: Secondary | ICD-10-CM | POA: Diagnosis not present

## 2014-11-21 DIAGNOSIS — G834 Cauda equina syndrome: Secondary | ICD-10-CM | POA: Diagnosis not present

## 2014-11-21 DIAGNOSIS — R32 Unspecified urinary incontinence: Secondary | ICD-10-CM | POA: Diagnosis not present

## 2014-11-21 DIAGNOSIS — G8250 Quadriplegia, unspecified: Secondary | ICD-10-CM | POA: Diagnosis not present

## 2014-11-21 DIAGNOSIS — Z436 Encounter for attention to other artificial openings of urinary tract: Secondary | ICD-10-CM | POA: Diagnosis not present

## 2014-11-25 DIAGNOSIS — R32 Unspecified urinary incontinence: Secondary | ICD-10-CM | POA: Diagnosis not present

## 2014-11-25 DIAGNOSIS — G8250 Quadriplegia, unspecified: Secondary | ICD-10-CM | POA: Diagnosis not present

## 2014-11-25 DIAGNOSIS — Z436 Encounter for attention to other artificial openings of urinary tract: Secondary | ICD-10-CM | POA: Diagnosis not present

## 2014-11-25 DIAGNOSIS — G834 Cauda equina syndrome: Secondary | ICD-10-CM | POA: Diagnosis not present

## 2014-11-28 DIAGNOSIS — Z436 Encounter for attention to other artificial openings of urinary tract: Secondary | ICD-10-CM | POA: Diagnosis not present

## 2014-11-28 DIAGNOSIS — G8250 Quadriplegia, unspecified: Secondary | ICD-10-CM | POA: Diagnosis not present

## 2014-11-28 DIAGNOSIS — G834 Cauda equina syndrome: Secondary | ICD-10-CM | POA: Diagnosis not present

## 2014-11-28 DIAGNOSIS — R32 Unspecified urinary incontinence: Secondary | ICD-10-CM | POA: Diagnosis not present

## 2014-12-02 DIAGNOSIS — Z436 Encounter for attention to other artificial openings of urinary tract: Secondary | ICD-10-CM | POA: Diagnosis not present

## 2014-12-02 DIAGNOSIS — G834 Cauda equina syndrome: Secondary | ICD-10-CM | POA: Diagnosis not present

## 2014-12-02 DIAGNOSIS — G8250 Quadriplegia, unspecified: Secondary | ICD-10-CM | POA: Diagnosis not present

## 2014-12-02 DIAGNOSIS — R32 Unspecified urinary incontinence: Secondary | ICD-10-CM | POA: Diagnosis not present

## 2014-12-05 DIAGNOSIS — G8250 Quadriplegia, unspecified: Secondary | ICD-10-CM | POA: Diagnosis not present

## 2014-12-05 DIAGNOSIS — Z436 Encounter for attention to other artificial openings of urinary tract: Secondary | ICD-10-CM | POA: Diagnosis not present

## 2014-12-05 DIAGNOSIS — G834 Cauda equina syndrome: Secondary | ICD-10-CM | POA: Diagnosis not present

## 2014-12-05 DIAGNOSIS — R32 Unspecified urinary incontinence: Secondary | ICD-10-CM | POA: Diagnosis not present

## 2014-12-09 DIAGNOSIS — G834 Cauda equina syndrome: Secondary | ICD-10-CM | POA: Diagnosis not present

## 2014-12-09 DIAGNOSIS — Z436 Encounter for attention to other artificial openings of urinary tract: Secondary | ICD-10-CM | POA: Diagnosis not present

## 2014-12-09 DIAGNOSIS — R32 Unspecified urinary incontinence: Secondary | ICD-10-CM | POA: Diagnosis not present

## 2014-12-09 DIAGNOSIS — G8250 Quadriplegia, unspecified: Secondary | ICD-10-CM | POA: Diagnosis not present

## 2014-12-12 DIAGNOSIS — R32 Unspecified urinary incontinence: Secondary | ICD-10-CM | POA: Diagnosis not present

## 2014-12-12 DIAGNOSIS — G834 Cauda equina syndrome: Secondary | ICD-10-CM | POA: Diagnosis not present

## 2014-12-12 DIAGNOSIS — G8250 Quadriplegia, unspecified: Secondary | ICD-10-CM | POA: Diagnosis not present

## 2014-12-12 DIAGNOSIS — Z436 Encounter for attention to other artificial openings of urinary tract: Secondary | ICD-10-CM | POA: Diagnosis not present

## 2014-12-16 DIAGNOSIS — G8250 Quadriplegia, unspecified: Secondary | ICD-10-CM | POA: Diagnosis not present

## 2014-12-16 DIAGNOSIS — G834 Cauda equina syndrome: Secondary | ICD-10-CM | POA: Diagnosis not present

## 2014-12-16 DIAGNOSIS — Z436 Encounter for attention to other artificial openings of urinary tract: Secondary | ICD-10-CM | POA: Diagnosis not present

## 2014-12-16 DIAGNOSIS — R32 Unspecified urinary incontinence: Secondary | ICD-10-CM | POA: Diagnosis not present

## 2014-12-19 DIAGNOSIS — G834 Cauda equina syndrome: Secondary | ICD-10-CM | POA: Diagnosis not present

## 2014-12-19 DIAGNOSIS — Z436 Encounter for attention to other artificial openings of urinary tract: Secondary | ICD-10-CM | POA: Diagnosis not present

## 2014-12-19 DIAGNOSIS — R32 Unspecified urinary incontinence: Secondary | ICD-10-CM | POA: Diagnosis not present

## 2014-12-19 DIAGNOSIS — G8250 Quadriplegia, unspecified: Secondary | ICD-10-CM | POA: Diagnosis not present

## 2014-12-23 DIAGNOSIS — Z436 Encounter for attention to other artificial openings of urinary tract: Secondary | ICD-10-CM | POA: Diagnosis not present

## 2014-12-23 DIAGNOSIS — G8250 Quadriplegia, unspecified: Secondary | ICD-10-CM | POA: Diagnosis not present

## 2014-12-23 DIAGNOSIS — R32 Unspecified urinary incontinence: Secondary | ICD-10-CM | POA: Diagnosis not present

## 2014-12-23 DIAGNOSIS — G834 Cauda equina syndrome: Secondary | ICD-10-CM | POA: Diagnosis not present

## 2014-12-25 DIAGNOSIS — Z436 Encounter for attention to other artificial openings of urinary tract: Secondary | ICD-10-CM | POA: Diagnosis not present

## 2014-12-25 DIAGNOSIS — G8250 Quadriplegia, unspecified: Secondary | ICD-10-CM | POA: Diagnosis not present

## 2014-12-25 DIAGNOSIS — G834 Cauda equina syndrome: Secondary | ICD-10-CM | POA: Diagnosis not present

## 2014-12-25 DIAGNOSIS — R32 Unspecified urinary incontinence: Secondary | ICD-10-CM | POA: Diagnosis not present

## 2014-12-26 DIAGNOSIS — Z436 Encounter for attention to other artificial openings of urinary tract: Secondary | ICD-10-CM | POA: Diagnosis not present

## 2014-12-26 DIAGNOSIS — G8250 Quadriplegia, unspecified: Secondary | ICD-10-CM | POA: Diagnosis not present

## 2014-12-26 DIAGNOSIS — R32 Unspecified urinary incontinence: Secondary | ICD-10-CM | POA: Diagnosis not present

## 2014-12-26 DIAGNOSIS — G834 Cauda equina syndrome: Secondary | ICD-10-CM | POA: Diagnosis not present

## 2014-12-31 ENCOUNTER — Ambulatory Visit: Payer: Self-pay

## 2014-12-31 DIAGNOSIS — G834 Cauda equina syndrome: Secondary | ICD-10-CM | POA: Diagnosis not present

## 2014-12-31 DIAGNOSIS — G8250 Quadriplegia, unspecified: Secondary | ICD-10-CM | POA: Diagnosis not present

## 2014-12-31 DIAGNOSIS — R32 Unspecified urinary incontinence: Secondary | ICD-10-CM | POA: Diagnosis not present

## 2014-12-31 DIAGNOSIS — Z436 Encounter for attention to other artificial openings of urinary tract: Secondary | ICD-10-CM | POA: Diagnosis not present

## 2015-01-03 ENCOUNTER — Ambulatory Visit
Admission: RE | Admit: 2015-01-03 | Discharge: 2015-01-03 | Disposition: A | Discharge status: Home / Self Care | Payer: Commercial Managed Care - HMO | Source: Ambulatory Visit | Attending: Internal Medicine | Admitting: Internal Medicine

## 2015-01-03 DIAGNOSIS — R6889 Other general symptoms and signs: Secondary | ICD-10-CM | POA: Diagnosis not present

## 2015-01-03 DIAGNOSIS — Z1231 Encounter for screening mammogram for malignant neoplasm of breast: Secondary | ICD-10-CM | POA: Diagnosis not present

## 2015-01-05 DIAGNOSIS — R32 Unspecified urinary incontinence: Secondary | ICD-10-CM | POA: Diagnosis not present

## 2015-01-05 DIAGNOSIS — Z436 Encounter for attention to other artificial openings of urinary tract: Secondary | ICD-10-CM | POA: Diagnosis not present

## 2015-01-05 DIAGNOSIS — G8250 Quadriplegia, unspecified: Secondary | ICD-10-CM | POA: Diagnosis not present

## 2015-01-05 DIAGNOSIS — G834 Cauda equina syndrome: Secondary | ICD-10-CM | POA: Diagnosis not present

## 2015-01-06 DIAGNOSIS — G8250 Quadriplegia, unspecified: Secondary | ICD-10-CM | POA: Diagnosis not present

## 2015-01-06 DIAGNOSIS — Z436 Encounter for attention to other artificial openings of urinary tract: Secondary | ICD-10-CM | POA: Diagnosis not present

## 2015-01-06 DIAGNOSIS — R32 Unspecified urinary incontinence: Secondary | ICD-10-CM | POA: Diagnosis not present

## 2015-01-06 DIAGNOSIS — G834 Cauda equina syndrome: Secondary | ICD-10-CM | POA: Diagnosis not present

## 2015-01-09 DIAGNOSIS — Z436 Encounter for attention to other artificial openings of urinary tract: Secondary | ICD-10-CM | POA: Diagnosis not present

## 2015-01-09 DIAGNOSIS — R32 Unspecified urinary incontinence: Secondary | ICD-10-CM | POA: Diagnosis not present

## 2015-01-09 DIAGNOSIS — G834 Cauda equina syndrome: Secondary | ICD-10-CM | POA: Diagnosis not present

## 2015-01-09 DIAGNOSIS — G8250 Quadriplegia, unspecified: Secondary | ICD-10-CM | POA: Diagnosis not present

## 2015-01-13 DIAGNOSIS — R32 Unspecified urinary incontinence: Secondary | ICD-10-CM | POA: Diagnosis not present

## 2015-01-13 DIAGNOSIS — G8250 Quadriplegia, unspecified: Secondary | ICD-10-CM | POA: Diagnosis not present

## 2015-01-13 DIAGNOSIS — Z436 Encounter for attention to other artificial openings of urinary tract: Secondary | ICD-10-CM | POA: Diagnosis not present

## 2015-01-13 DIAGNOSIS — G834 Cauda equina syndrome: Secondary | ICD-10-CM | POA: Diagnosis not present

## 2015-01-14 DIAGNOSIS — R32 Unspecified urinary incontinence: Secondary | ICD-10-CM | POA: Diagnosis not present

## 2015-01-14 DIAGNOSIS — G8250 Quadriplegia, unspecified: Secondary | ICD-10-CM | POA: Diagnosis not present

## 2015-01-14 DIAGNOSIS — G834 Cauda equina syndrome: Secondary | ICD-10-CM | POA: Diagnosis not present

## 2015-01-14 DIAGNOSIS — Z436 Encounter for attention to other artificial openings of urinary tract: Secondary | ICD-10-CM | POA: Diagnosis not present

## 2015-01-16 DIAGNOSIS — Z436 Encounter for attention to other artificial openings of urinary tract: Secondary | ICD-10-CM | POA: Diagnosis not present

## 2015-01-16 DIAGNOSIS — R32 Unspecified urinary incontinence: Secondary | ICD-10-CM | POA: Diagnosis not present

## 2015-01-16 DIAGNOSIS — G834 Cauda equina syndrome: Secondary | ICD-10-CM | POA: Diagnosis not present

## 2015-01-16 DIAGNOSIS — G8250 Quadriplegia, unspecified: Secondary | ICD-10-CM | POA: Diagnosis not present

## 2015-01-20 DIAGNOSIS — Z436 Encounter for attention to other artificial openings of urinary tract: Secondary | ICD-10-CM | POA: Diagnosis not present

## 2015-01-20 DIAGNOSIS — R32 Unspecified urinary incontinence: Secondary | ICD-10-CM | POA: Diagnosis not present

## 2015-01-20 DIAGNOSIS — G8250 Quadriplegia, unspecified: Secondary | ICD-10-CM | POA: Diagnosis not present

## 2015-01-20 DIAGNOSIS — G834 Cauda equina syndrome: Secondary | ICD-10-CM | POA: Diagnosis not present

## 2015-01-23 DIAGNOSIS — R32 Unspecified urinary incontinence: Secondary | ICD-10-CM | POA: Diagnosis not present

## 2015-01-23 DIAGNOSIS — G8250 Quadriplegia, unspecified: Secondary | ICD-10-CM | POA: Diagnosis not present

## 2015-01-23 DIAGNOSIS — Z436 Encounter for attention to other artificial openings of urinary tract: Secondary | ICD-10-CM | POA: Diagnosis not present

## 2015-01-23 DIAGNOSIS — G834 Cauda equina syndrome: Secondary | ICD-10-CM | POA: Diagnosis not present

## 2015-01-24 DIAGNOSIS — G834 Cauda equina syndrome: Secondary | ICD-10-CM | POA: Diagnosis not present

## 2015-01-24 DIAGNOSIS — G8250 Quadriplegia, unspecified: Secondary | ICD-10-CM | POA: Diagnosis not present

## 2015-01-24 DIAGNOSIS — Z436 Encounter for attention to other artificial openings of urinary tract: Secondary | ICD-10-CM | POA: Diagnosis not present

## 2015-01-24 DIAGNOSIS — R32 Unspecified urinary incontinence: Secondary | ICD-10-CM | POA: Diagnosis not present

## 2015-01-27 DIAGNOSIS — Z436 Encounter for attention to other artificial openings of urinary tract: Secondary | ICD-10-CM | POA: Diagnosis not present

## 2015-01-27 DIAGNOSIS — G8250 Quadriplegia, unspecified: Secondary | ICD-10-CM | POA: Diagnosis not present

## 2015-01-27 DIAGNOSIS — G834 Cauda equina syndrome: Secondary | ICD-10-CM | POA: Diagnosis not present

## 2015-01-27 DIAGNOSIS — R32 Unspecified urinary incontinence: Secondary | ICD-10-CM | POA: Diagnosis not present

## 2015-01-29 DIAGNOSIS — Z436 Encounter for attention to other artificial openings of urinary tract: Secondary | ICD-10-CM | POA: Diagnosis not present

## 2015-01-29 DIAGNOSIS — R32 Unspecified urinary incontinence: Secondary | ICD-10-CM | POA: Diagnosis not present

## 2015-01-29 DIAGNOSIS — G834 Cauda equina syndrome: Secondary | ICD-10-CM | POA: Diagnosis not present

## 2015-01-29 DIAGNOSIS — G8250 Quadriplegia, unspecified: Secondary | ICD-10-CM | POA: Diagnosis not present

## 2015-02-03 DIAGNOSIS — G834 Cauda equina syndrome: Secondary | ICD-10-CM | POA: Diagnosis not present

## 2015-02-03 DIAGNOSIS — R32 Unspecified urinary incontinence: Secondary | ICD-10-CM | POA: Diagnosis not present

## 2015-02-03 DIAGNOSIS — Z436 Encounter for attention to other artificial openings of urinary tract: Secondary | ICD-10-CM | POA: Diagnosis not present

## 2015-02-03 DIAGNOSIS — G8250 Quadriplegia, unspecified: Secondary | ICD-10-CM | POA: Diagnosis not present

## 2015-02-04 DIAGNOSIS — R32 Unspecified urinary incontinence: Secondary | ICD-10-CM | POA: Diagnosis not present

## 2015-02-04 DIAGNOSIS — Z436 Encounter for attention to other artificial openings of urinary tract: Secondary | ICD-10-CM | POA: Diagnosis not present

## 2015-02-04 DIAGNOSIS — G8250 Quadriplegia, unspecified: Secondary | ICD-10-CM | POA: Diagnosis not present

## 2015-02-04 DIAGNOSIS — G834 Cauda equina syndrome: Secondary | ICD-10-CM | POA: Diagnosis not present

## 2015-02-05 DIAGNOSIS — R3911 Hesitancy of micturition: Secondary | ICD-10-CM | POA: Diagnosis not present

## 2015-02-05 DIAGNOSIS — S129XXA Fracture of neck, unspecified, initial encounter: Secondary | ICD-10-CM | POA: Diagnosis not present

## 2015-02-05 DIAGNOSIS — R6889 Other general symptoms and signs: Secondary | ICD-10-CM | POA: Diagnosis not present

## 2015-02-05 DIAGNOSIS — K029 Dental caries, unspecified: Secondary | ICD-10-CM | POA: Diagnosis not present

## 2015-02-05 DIAGNOSIS — R5381 Other malaise: Secondary | ICD-10-CM | POA: Diagnosis not present

## 2015-02-05 DIAGNOSIS — B3741 Candidal cystitis and urethritis: Secondary | ICD-10-CM | POA: Diagnosis not present

## 2015-02-05 DIAGNOSIS — M159 Polyosteoarthritis, unspecified: Secondary | ICD-10-CM | POA: Diagnosis not present

## 2015-02-05 DIAGNOSIS — N3010 Interstitial cystitis (chronic) without hematuria: Secondary | ICD-10-CM | POA: Diagnosis not present

## 2015-02-05 DIAGNOSIS — R69 Illness, unspecified: Secondary | ICD-10-CM | POA: Diagnosis not present

## 2015-02-06 DIAGNOSIS — Z436 Encounter for attention to other artificial openings of urinary tract: Secondary | ICD-10-CM | POA: Diagnosis not present

## 2015-02-06 DIAGNOSIS — R32 Unspecified urinary incontinence: Secondary | ICD-10-CM | POA: Diagnosis not present

## 2015-02-06 DIAGNOSIS — G834 Cauda equina syndrome: Secondary | ICD-10-CM | POA: Diagnosis not present

## 2015-02-06 DIAGNOSIS — G8250 Quadriplegia, unspecified: Secondary | ICD-10-CM | POA: Diagnosis not present

## 2015-02-10 DIAGNOSIS — Z436 Encounter for attention to other artificial openings of urinary tract: Secondary | ICD-10-CM | POA: Diagnosis not present

## 2015-02-10 DIAGNOSIS — G834 Cauda equina syndrome: Secondary | ICD-10-CM | POA: Diagnosis not present

## 2015-02-10 DIAGNOSIS — G8250 Quadriplegia, unspecified: Secondary | ICD-10-CM | POA: Diagnosis not present

## 2015-02-10 DIAGNOSIS — R32 Unspecified urinary incontinence: Secondary | ICD-10-CM | POA: Diagnosis not present

## 2015-02-13 DIAGNOSIS — Z436 Encounter for attention to other artificial openings of urinary tract: Secondary | ICD-10-CM | POA: Diagnosis not present

## 2015-02-13 DIAGNOSIS — G834 Cauda equina syndrome: Secondary | ICD-10-CM | POA: Diagnosis not present

## 2015-02-13 DIAGNOSIS — G8250 Quadriplegia, unspecified: Secondary | ICD-10-CM | POA: Diagnosis not present

## 2015-02-13 DIAGNOSIS — R32 Unspecified urinary incontinence: Secondary | ICD-10-CM | POA: Diagnosis not present

## 2015-02-17 DIAGNOSIS — G834 Cauda equina syndrome: Secondary | ICD-10-CM | POA: Diagnosis not present

## 2015-02-17 DIAGNOSIS — G8250 Quadriplegia, unspecified: Secondary | ICD-10-CM | POA: Diagnosis not present

## 2015-02-17 DIAGNOSIS — R32 Unspecified urinary incontinence: Secondary | ICD-10-CM | POA: Diagnosis not present

## 2015-02-17 DIAGNOSIS — Z436 Encounter for attention to other artificial openings of urinary tract: Secondary | ICD-10-CM | POA: Diagnosis not present

## 2015-02-18 DIAGNOSIS — G834 Cauda equina syndrome: Secondary | ICD-10-CM | POA: Diagnosis not present

## 2015-02-18 DIAGNOSIS — G8250 Quadriplegia, unspecified: Secondary | ICD-10-CM | POA: Diagnosis not present

## 2015-02-18 DIAGNOSIS — R32 Unspecified urinary incontinence: Secondary | ICD-10-CM | POA: Diagnosis not present

## 2015-02-18 DIAGNOSIS — Z436 Encounter for attention to other artificial openings of urinary tract: Secondary | ICD-10-CM | POA: Diagnosis not present

## 2015-02-20 DIAGNOSIS — G834 Cauda equina syndrome: Secondary | ICD-10-CM | POA: Diagnosis not present

## 2015-02-20 DIAGNOSIS — Z436 Encounter for attention to other artificial openings of urinary tract: Secondary | ICD-10-CM | POA: Diagnosis not present

## 2015-02-20 DIAGNOSIS — R32 Unspecified urinary incontinence: Secondary | ICD-10-CM | POA: Diagnosis not present

## 2015-02-20 DIAGNOSIS — G8250 Quadriplegia, unspecified: Secondary | ICD-10-CM | POA: Diagnosis not present

## 2015-02-24 DIAGNOSIS — G8250 Quadriplegia, unspecified: Secondary | ICD-10-CM | POA: Diagnosis not present

## 2015-02-24 DIAGNOSIS — G834 Cauda equina syndrome: Secondary | ICD-10-CM | POA: Diagnosis not present

## 2015-02-24 DIAGNOSIS — R32 Unspecified urinary incontinence: Secondary | ICD-10-CM | POA: Diagnosis not present

## 2015-02-24 DIAGNOSIS — Z436 Encounter for attention to other artificial openings of urinary tract: Secondary | ICD-10-CM | POA: Diagnosis not present

## 2015-02-27 DIAGNOSIS — Z436 Encounter for attention to other artificial openings of urinary tract: Secondary | ICD-10-CM | POA: Diagnosis not present

## 2015-02-27 DIAGNOSIS — G8250 Quadriplegia, unspecified: Secondary | ICD-10-CM | POA: Diagnosis not present

## 2015-02-27 DIAGNOSIS — G834 Cauda equina syndrome: Secondary | ICD-10-CM | POA: Diagnosis not present

## 2015-02-27 DIAGNOSIS — N390 Urinary tract infection, site not specified: Secondary | ICD-10-CM | POA: Diagnosis not present

## 2015-02-27 DIAGNOSIS — R32 Unspecified urinary incontinence: Secondary | ICD-10-CM | POA: Diagnosis not present

## 2015-03-04 DIAGNOSIS — G8250 Quadriplegia, unspecified: Secondary | ICD-10-CM | POA: Diagnosis not present

## 2015-03-04 DIAGNOSIS — R32 Unspecified urinary incontinence: Secondary | ICD-10-CM | POA: Diagnosis not present

## 2015-03-04 DIAGNOSIS — Z436 Encounter for attention to other artificial openings of urinary tract: Secondary | ICD-10-CM | POA: Diagnosis not present

## 2015-03-04 DIAGNOSIS — G834 Cauda equina syndrome: Secondary | ICD-10-CM | POA: Diagnosis not present

## 2015-03-10 DIAGNOSIS — G834 Cauda equina syndrome: Secondary | ICD-10-CM | POA: Diagnosis not present

## 2015-03-10 DIAGNOSIS — G8250 Quadriplegia, unspecified: Secondary | ICD-10-CM | POA: Diagnosis not present

## 2015-03-10 DIAGNOSIS — Z436 Encounter for attention to other artificial openings of urinary tract: Secondary | ICD-10-CM | POA: Diagnosis not present

## 2015-03-13 DIAGNOSIS — Z436 Encounter for attention to other artificial openings of urinary tract: Secondary | ICD-10-CM | POA: Diagnosis not present

## 2015-03-13 DIAGNOSIS — G834 Cauda equina syndrome: Secondary | ICD-10-CM | POA: Diagnosis not present

## 2015-03-13 DIAGNOSIS — G8250 Quadriplegia, unspecified: Secondary | ICD-10-CM | POA: Diagnosis not present

## 2015-03-18 DIAGNOSIS — G8250 Quadriplegia, unspecified: Secondary | ICD-10-CM | POA: Diagnosis not present

## 2015-03-18 DIAGNOSIS — Z436 Encounter for attention to other artificial openings of urinary tract: Secondary | ICD-10-CM | POA: Diagnosis not present

## 2015-03-18 DIAGNOSIS — G834 Cauda equina syndrome: Secondary | ICD-10-CM | POA: Diagnosis not present

## 2015-03-20 DIAGNOSIS — G8250 Quadriplegia, unspecified: Secondary | ICD-10-CM | POA: Diagnosis not present

## 2015-03-20 DIAGNOSIS — G834 Cauda equina syndrome: Secondary | ICD-10-CM | POA: Diagnosis not present

## 2015-03-20 DIAGNOSIS — Z436 Encounter for attention to other artificial openings of urinary tract: Secondary | ICD-10-CM | POA: Diagnosis not present

## 2015-03-24 DIAGNOSIS — G834 Cauda equina syndrome: Secondary | ICD-10-CM | POA: Diagnosis not present

## 2015-03-24 DIAGNOSIS — G8250 Quadriplegia, unspecified: Secondary | ICD-10-CM | POA: Diagnosis not present

## 2015-03-24 DIAGNOSIS — Z436 Encounter for attention to other artificial openings of urinary tract: Secondary | ICD-10-CM | POA: Diagnosis not present

## 2015-03-26 DIAGNOSIS — G834 Cauda equina syndrome: Secondary | ICD-10-CM | POA: Diagnosis not present

## 2015-03-26 DIAGNOSIS — Z436 Encounter for attention to other artificial openings of urinary tract: Secondary | ICD-10-CM | POA: Diagnosis not present

## 2015-03-26 DIAGNOSIS — G8250 Quadriplegia, unspecified: Secondary | ICD-10-CM | POA: Diagnosis not present

## 2015-03-27 DIAGNOSIS — G8250 Quadriplegia, unspecified: Secondary | ICD-10-CM | POA: Diagnosis not present

## 2015-03-27 DIAGNOSIS — G834 Cauda equina syndrome: Secondary | ICD-10-CM | POA: Diagnosis not present

## 2015-03-27 DIAGNOSIS — Z436 Encounter for attention to other artificial openings of urinary tract: Secondary | ICD-10-CM | POA: Diagnosis not present

## 2015-03-31 DIAGNOSIS — G8250 Quadriplegia, unspecified: Secondary | ICD-10-CM | POA: Diagnosis not present

## 2015-03-31 DIAGNOSIS — G834 Cauda equina syndrome: Secondary | ICD-10-CM | POA: Diagnosis not present

## 2015-03-31 DIAGNOSIS — Z436 Encounter for attention to other artificial openings of urinary tract: Secondary | ICD-10-CM | POA: Diagnosis not present

## 2015-04-03 DIAGNOSIS — G834 Cauda equina syndrome: Secondary | ICD-10-CM | POA: Diagnosis not present

## 2015-04-03 DIAGNOSIS — G8250 Quadriplegia, unspecified: Secondary | ICD-10-CM | POA: Diagnosis not present

## 2015-04-03 DIAGNOSIS — Z436 Encounter for attention to other artificial openings of urinary tract: Secondary | ICD-10-CM | POA: Diagnosis not present

## 2015-04-07 DIAGNOSIS — G834 Cauda equina syndrome: Secondary | ICD-10-CM | POA: Diagnosis not present

## 2015-04-07 DIAGNOSIS — Z436 Encounter for attention to other artificial openings of urinary tract: Secondary | ICD-10-CM | POA: Diagnosis not present

## 2015-04-07 DIAGNOSIS — G8250 Quadriplegia, unspecified: Secondary | ICD-10-CM | POA: Diagnosis not present

## 2015-04-08 DIAGNOSIS — Z436 Encounter for attention to other artificial openings of urinary tract: Secondary | ICD-10-CM | POA: Diagnosis not present

## 2015-04-08 DIAGNOSIS — G834 Cauda equina syndrome: Secondary | ICD-10-CM | POA: Diagnosis not present

## 2015-04-08 DIAGNOSIS — G8250 Quadriplegia, unspecified: Secondary | ICD-10-CM | POA: Diagnosis not present

## 2015-04-10 DIAGNOSIS — Z436 Encounter for attention to other artificial openings of urinary tract: Secondary | ICD-10-CM | POA: Diagnosis not present

## 2015-04-10 DIAGNOSIS — G834 Cauda equina syndrome: Secondary | ICD-10-CM | POA: Diagnosis not present

## 2015-04-10 DIAGNOSIS — G8250 Quadriplegia, unspecified: Secondary | ICD-10-CM | POA: Diagnosis not present

## 2015-04-14 DIAGNOSIS — Z436 Encounter for attention to other artificial openings of urinary tract: Secondary | ICD-10-CM | POA: Diagnosis not present

## 2015-04-14 DIAGNOSIS — G8250 Quadriplegia, unspecified: Secondary | ICD-10-CM | POA: Diagnosis not present

## 2015-04-14 DIAGNOSIS — G834 Cauda equina syndrome: Secondary | ICD-10-CM | POA: Diagnosis not present

## 2015-04-15 DIAGNOSIS — G834 Cauda equina syndrome: Secondary | ICD-10-CM | POA: Diagnosis not present

## 2015-04-15 DIAGNOSIS — G8250 Quadriplegia, unspecified: Secondary | ICD-10-CM | POA: Diagnosis not present

## 2015-04-15 DIAGNOSIS — Z436 Encounter for attention to other artificial openings of urinary tract: Secondary | ICD-10-CM | POA: Diagnosis not present

## 2015-04-16 DIAGNOSIS — Z436 Encounter for attention to other artificial openings of urinary tract: Secondary | ICD-10-CM | POA: Diagnosis not present

## 2015-04-16 DIAGNOSIS — G8250 Quadriplegia, unspecified: Secondary | ICD-10-CM | POA: Diagnosis not present

## 2015-04-17 DIAGNOSIS — Z436 Encounter for attention to other artificial openings of urinary tract: Secondary | ICD-10-CM | POA: Diagnosis not present

## 2015-04-17 DIAGNOSIS — G8250 Quadriplegia, unspecified: Secondary | ICD-10-CM | POA: Diagnosis not present

## 2015-04-17 DIAGNOSIS — G834 Cauda equina syndrome: Secondary | ICD-10-CM | POA: Diagnosis not present

## 2015-04-21 DIAGNOSIS — Z436 Encounter for attention to other artificial openings of urinary tract: Secondary | ICD-10-CM | POA: Diagnosis not present

## 2015-04-21 DIAGNOSIS — G834 Cauda equina syndrome: Secondary | ICD-10-CM | POA: Diagnosis not present

## 2015-04-21 DIAGNOSIS — G8250 Quadriplegia, unspecified: Secondary | ICD-10-CM | POA: Diagnosis not present

## 2015-04-24 DIAGNOSIS — G8250 Quadriplegia, unspecified: Secondary | ICD-10-CM | POA: Diagnosis not present

## 2015-04-24 DIAGNOSIS — Z436 Encounter for attention to other artificial openings of urinary tract: Secondary | ICD-10-CM | POA: Diagnosis not present

## 2015-04-24 DIAGNOSIS — G834 Cauda equina syndrome: Secondary | ICD-10-CM | POA: Diagnosis not present

## 2015-04-28 DIAGNOSIS — G8250 Quadriplegia, unspecified: Secondary | ICD-10-CM | POA: Diagnosis not present

## 2015-04-28 DIAGNOSIS — G834 Cauda equina syndrome: Secondary | ICD-10-CM | POA: Diagnosis not present

## 2015-04-28 DIAGNOSIS — Z436 Encounter for attention to other artificial openings of urinary tract: Secondary | ICD-10-CM | POA: Diagnosis not present

## 2015-04-30 DIAGNOSIS — G8250 Quadriplegia, unspecified: Secondary | ICD-10-CM | POA: Diagnosis not present

## 2015-04-30 DIAGNOSIS — G834 Cauda equina syndrome: Secondary | ICD-10-CM | POA: Diagnosis not present

## 2015-04-30 DIAGNOSIS — Z436 Encounter for attention to other artificial openings of urinary tract: Secondary | ICD-10-CM | POA: Diagnosis not present

## 2015-05-04 DIAGNOSIS — Z436 Encounter for attention to other artificial openings of urinary tract: Secondary | ICD-10-CM | POA: Diagnosis not present

## 2015-05-04 DIAGNOSIS — G834 Cauda equina syndrome: Secondary | ICD-10-CM | POA: Diagnosis not present

## 2015-05-04 DIAGNOSIS — G8250 Quadriplegia, unspecified: Secondary | ICD-10-CM | POA: Diagnosis not present

## 2015-05-05 DIAGNOSIS — G834 Cauda equina syndrome: Secondary | ICD-10-CM | POA: Diagnosis not present

## 2015-05-05 DIAGNOSIS — Z436 Encounter for attention to other artificial openings of urinary tract: Secondary | ICD-10-CM | POA: Diagnosis not present

## 2015-05-05 DIAGNOSIS — G8250 Quadriplegia, unspecified: Secondary | ICD-10-CM | POA: Diagnosis not present

## 2015-05-08 DIAGNOSIS — G834 Cauda equina syndrome: Secondary | ICD-10-CM | POA: Diagnosis not present

## 2015-05-08 DIAGNOSIS — G8250 Quadriplegia, unspecified: Secondary | ICD-10-CM | POA: Diagnosis not present

## 2015-05-08 DIAGNOSIS — Z436 Encounter for attention to other artificial openings of urinary tract: Secondary | ICD-10-CM | POA: Diagnosis not present

## 2015-05-12 DIAGNOSIS — Z436 Encounter for attention to other artificial openings of urinary tract: Secondary | ICD-10-CM | POA: Diagnosis not present

## 2015-05-12 DIAGNOSIS — G8250 Quadriplegia, unspecified: Secondary | ICD-10-CM | POA: Diagnosis not present

## 2015-05-12 DIAGNOSIS — G834 Cauda equina syndrome: Secondary | ICD-10-CM | POA: Diagnosis not present

## 2015-05-15 DIAGNOSIS — Z436 Encounter for attention to other artificial openings of urinary tract: Secondary | ICD-10-CM | POA: Diagnosis not present

## 2015-05-15 DIAGNOSIS — G834 Cauda equina syndrome: Secondary | ICD-10-CM | POA: Diagnosis not present

## 2015-05-15 DIAGNOSIS — G8250 Quadriplegia, unspecified: Secondary | ICD-10-CM | POA: Diagnosis not present

## 2015-05-16 DIAGNOSIS — Z436 Encounter for attention to other artificial openings of urinary tract: Secondary | ICD-10-CM | POA: Diagnosis not present

## 2015-05-20 DIAGNOSIS — G8250 Quadriplegia, unspecified: Secondary | ICD-10-CM | POA: Diagnosis not present

## 2015-05-20 DIAGNOSIS — G834 Cauda equina syndrome: Secondary | ICD-10-CM | POA: Diagnosis not present

## 2015-05-20 DIAGNOSIS — Z436 Encounter for attention to other artificial openings of urinary tract: Secondary | ICD-10-CM | POA: Diagnosis not present

## 2015-05-21 DIAGNOSIS — G834 Cauda equina syndrome: Secondary | ICD-10-CM | POA: Diagnosis not present

## 2015-05-21 DIAGNOSIS — Z436 Encounter for attention to other artificial openings of urinary tract: Secondary | ICD-10-CM | POA: Diagnosis not present

## 2015-05-21 DIAGNOSIS — G8250 Quadriplegia, unspecified: Secondary | ICD-10-CM | POA: Diagnosis not present

## 2015-05-22 DIAGNOSIS — G8250 Quadriplegia, unspecified: Secondary | ICD-10-CM | POA: Diagnosis not present

## 2015-05-22 DIAGNOSIS — Z436 Encounter for attention to other artificial openings of urinary tract: Secondary | ICD-10-CM | POA: Diagnosis not present

## 2015-05-22 DIAGNOSIS — G834 Cauda equina syndrome: Secondary | ICD-10-CM | POA: Diagnosis not present

## 2015-05-26 DIAGNOSIS — G834 Cauda equina syndrome: Secondary | ICD-10-CM | POA: Diagnosis not present

## 2015-05-26 DIAGNOSIS — Z436 Encounter for attention to other artificial openings of urinary tract: Secondary | ICD-10-CM | POA: Diagnosis not present

## 2015-05-26 DIAGNOSIS — G8250 Quadriplegia, unspecified: Secondary | ICD-10-CM | POA: Diagnosis not present

## 2015-05-29 DIAGNOSIS — G834 Cauda equina syndrome: Secondary | ICD-10-CM | POA: Diagnosis not present

## 2015-05-29 DIAGNOSIS — K029 Dental caries, unspecified: Secondary | ICD-10-CM | POA: Diagnosis not present

## 2015-05-29 DIAGNOSIS — R5381 Other malaise: Secondary | ICD-10-CM | POA: Diagnosis not present

## 2015-05-29 DIAGNOSIS — N3010 Interstitial cystitis (chronic) without hematuria: Secondary | ICD-10-CM | POA: Diagnosis not present

## 2015-05-29 DIAGNOSIS — Z436 Encounter for attention to other artificial openings of urinary tract: Secondary | ICD-10-CM | POA: Diagnosis not present

## 2015-05-29 DIAGNOSIS — G8250 Quadriplegia, unspecified: Secondary | ICD-10-CM | POA: Diagnosis not present

## 2015-06-02 DIAGNOSIS — G834 Cauda equina syndrome: Secondary | ICD-10-CM | POA: Diagnosis not present

## 2015-06-02 DIAGNOSIS — G8250 Quadriplegia, unspecified: Secondary | ICD-10-CM | POA: Diagnosis not present

## 2015-06-02 DIAGNOSIS — Z436 Encounter for attention to other artificial openings of urinary tract: Secondary | ICD-10-CM | POA: Diagnosis not present

## 2015-06-05 DIAGNOSIS — Z436 Encounter for attention to other artificial openings of urinary tract: Secondary | ICD-10-CM | POA: Diagnosis not present

## 2015-06-05 DIAGNOSIS — G8250 Quadriplegia, unspecified: Secondary | ICD-10-CM | POA: Diagnosis not present

## 2015-06-05 DIAGNOSIS — G834 Cauda equina syndrome: Secondary | ICD-10-CM | POA: Diagnosis not present

## 2015-06-09 DIAGNOSIS — Z436 Encounter for attention to other artificial openings of urinary tract: Secondary | ICD-10-CM | POA: Diagnosis not present

## 2015-06-09 DIAGNOSIS — G8250 Quadriplegia, unspecified: Secondary | ICD-10-CM | POA: Diagnosis not present

## 2015-06-09 DIAGNOSIS — G834 Cauda equina syndrome: Secondary | ICD-10-CM | POA: Diagnosis not present

## 2015-06-10 DIAGNOSIS — G834 Cauda equina syndrome: Secondary | ICD-10-CM | POA: Diagnosis not present

## 2015-06-10 DIAGNOSIS — Z436 Encounter for attention to other artificial openings of urinary tract: Secondary | ICD-10-CM | POA: Diagnosis not present

## 2015-06-10 DIAGNOSIS — G8250 Quadriplegia, unspecified: Secondary | ICD-10-CM | POA: Diagnosis not present

## 2015-06-11 DIAGNOSIS — G8250 Quadriplegia, unspecified: Secondary | ICD-10-CM | POA: Diagnosis not present

## 2015-06-11 DIAGNOSIS — Z436 Encounter for attention to other artificial openings of urinary tract: Secondary | ICD-10-CM | POA: Diagnosis not present

## 2015-06-11 DIAGNOSIS — G834 Cauda equina syndrome: Secondary | ICD-10-CM | POA: Diagnosis not present

## 2015-06-12 DIAGNOSIS — G834 Cauda equina syndrome: Secondary | ICD-10-CM | POA: Diagnosis not present

## 2015-06-12 DIAGNOSIS — G8250 Quadriplegia, unspecified: Secondary | ICD-10-CM | POA: Diagnosis not present

## 2015-06-12 DIAGNOSIS — Z436 Encounter for attention to other artificial openings of urinary tract: Secondary | ICD-10-CM | POA: Diagnosis not present

## 2015-06-17 DIAGNOSIS — Z436 Encounter for attention to other artificial openings of urinary tract: Secondary | ICD-10-CM | POA: Diagnosis not present

## 2015-06-17 DIAGNOSIS — G834 Cauda equina syndrome: Secondary | ICD-10-CM | POA: Diagnosis not present

## 2015-06-17 DIAGNOSIS — G8250 Quadriplegia, unspecified: Secondary | ICD-10-CM | POA: Diagnosis not present

## 2015-06-19 DIAGNOSIS — Z436 Encounter for attention to other artificial openings of urinary tract: Secondary | ICD-10-CM | POA: Diagnosis not present

## 2015-06-19 DIAGNOSIS — G8250 Quadriplegia, unspecified: Secondary | ICD-10-CM | POA: Diagnosis not present

## 2015-06-19 DIAGNOSIS — G834 Cauda equina syndrome: Secondary | ICD-10-CM | POA: Diagnosis not present

## 2015-06-23 DIAGNOSIS — Z436 Encounter for attention to other artificial openings of urinary tract: Secondary | ICD-10-CM | POA: Diagnosis not present

## 2015-06-23 DIAGNOSIS — G8250 Quadriplegia, unspecified: Secondary | ICD-10-CM | POA: Diagnosis not present

## 2015-06-23 DIAGNOSIS — G834 Cauda equina syndrome: Secondary | ICD-10-CM | POA: Diagnosis not present

## 2015-06-26 DIAGNOSIS — G8250 Quadriplegia, unspecified: Secondary | ICD-10-CM | POA: Diagnosis not present

## 2015-06-26 DIAGNOSIS — Z436 Encounter for attention to other artificial openings of urinary tract: Secondary | ICD-10-CM | POA: Diagnosis not present

## 2015-06-26 DIAGNOSIS — G834 Cauda equina syndrome: Secondary | ICD-10-CM | POA: Diagnosis not present

## 2015-06-30 DIAGNOSIS — G8250 Quadriplegia, unspecified: Secondary | ICD-10-CM | POA: Diagnosis not present

## 2015-06-30 DIAGNOSIS — G834 Cauda equina syndrome: Secondary | ICD-10-CM | POA: Diagnosis not present

## 2015-06-30 DIAGNOSIS — Z436 Encounter for attention to other artificial openings of urinary tract: Secondary | ICD-10-CM | POA: Diagnosis not present

## 2015-06-30 DIAGNOSIS — R32 Unspecified urinary incontinence: Secondary | ICD-10-CM | POA: Diagnosis not present

## 2015-07-01 DIAGNOSIS — Z436 Encounter for attention to other artificial openings of urinary tract: Secondary | ICD-10-CM | POA: Diagnosis not present

## 2015-07-01 DIAGNOSIS — G834 Cauda equina syndrome: Secondary | ICD-10-CM | POA: Diagnosis not present

## 2015-07-01 DIAGNOSIS — G8250 Quadriplegia, unspecified: Secondary | ICD-10-CM | POA: Diagnosis not present

## 2015-07-03 DIAGNOSIS — G834 Cauda equina syndrome: Secondary | ICD-10-CM | POA: Diagnosis not present

## 2015-07-03 DIAGNOSIS — R32 Unspecified urinary incontinence: Secondary | ICD-10-CM | POA: Diagnosis not present

## 2015-07-03 DIAGNOSIS — G8250 Quadriplegia, unspecified: Secondary | ICD-10-CM | POA: Diagnosis not present

## 2015-07-03 DIAGNOSIS — Z436 Encounter for attention to other artificial openings of urinary tract: Secondary | ICD-10-CM | POA: Diagnosis not present

## 2015-07-07 DIAGNOSIS — Z436 Encounter for attention to other artificial openings of urinary tract: Secondary | ICD-10-CM | POA: Diagnosis not present

## 2015-07-07 DIAGNOSIS — G834 Cauda equina syndrome: Secondary | ICD-10-CM | POA: Diagnosis not present

## 2015-07-07 DIAGNOSIS — G8250 Quadriplegia, unspecified: Secondary | ICD-10-CM | POA: Diagnosis not present

## 2015-07-07 DIAGNOSIS — R32 Unspecified urinary incontinence: Secondary | ICD-10-CM | POA: Diagnosis not present

## 2015-07-10 DIAGNOSIS — G834 Cauda equina syndrome: Secondary | ICD-10-CM | POA: Diagnosis not present

## 2015-07-10 DIAGNOSIS — R32 Unspecified urinary incontinence: Secondary | ICD-10-CM | POA: Diagnosis not present

## 2015-07-10 DIAGNOSIS — G8250 Quadriplegia, unspecified: Secondary | ICD-10-CM | POA: Diagnosis not present

## 2015-07-10 DIAGNOSIS — Z436 Encounter for attention to other artificial openings of urinary tract: Secondary | ICD-10-CM | POA: Diagnosis not present

## 2015-07-11 DIAGNOSIS — G834 Cauda equina syndrome: Secondary | ICD-10-CM | POA: Diagnosis not present

## 2015-07-11 DIAGNOSIS — G8250 Quadriplegia, unspecified: Secondary | ICD-10-CM | POA: Diagnosis not present

## 2015-07-11 DIAGNOSIS — Z436 Encounter for attention to other artificial openings of urinary tract: Secondary | ICD-10-CM | POA: Diagnosis not present

## 2015-07-14 DIAGNOSIS — R32 Unspecified urinary incontinence: Secondary | ICD-10-CM | POA: Diagnosis not present

## 2015-07-14 DIAGNOSIS — Z436 Encounter for attention to other artificial openings of urinary tract: Secondary | ICD-10-CM | POA: Diagnosis not present

## 2015-07-14 DIAGNOSIS — G8250 Quadriplegia, unspecified: Secondary | ICD-10-CM | POA: Diagnosis not present

## 2015-07-14 DIAGNOSIS — G834 Cauda equina syndrome: Secondary | ICD-10-CM | POA: Diagnosis not present

## 2015-07-17 DIAGNOSIS — G8250 Quadriplegia, unspecified: Secondary | ICD-10-CM | POA: Diagnosis not present

## 2015-07-17 DIAGNOSIS — R32 Unspecified urinary incontinence: Secondary | ICD-10-CM | POA: Diagnosis not present

## 2015-07-17 DIAGNOSIS — G834 Cauda equina syndrome: Secondary | ICD-10-CM | POA: Diagnosis not present

## 2015-07-17 DIAGNOSIS — Z436 Encounter for attention to other artificial openings of urinary tract: Secondary | ICD-10-CM | POA: Diagnosis not present

## 2015-07-21 DIAGNOSIS — G8250 Quadriplegia, unspecified: Secondary | ICD-10-CM | POA: Diagnosis not present

## 2015-07-21 DIAGNOSIS — G834 Cauda equina syndrome: Secondary | ICD-10-CM | POA: Diagnosis not present

## 2015-07-21 DIAGNOSIS — R32 Unspecified urinary incontinence: Secondary | ICD-10-CM | POA: Diagnosis not present

## 2015-07-21 DIAGNOSIS — Z436 Encounter for attention to other artificial openings of urinary tract: Secondary | ICD-10-CM | POA: Diagnosis not present

## 2015-07-24 DIAGNOSIS — G8250 Quadriplegia, unspecified: Secondary | ICD-10-CM | POA: Diagnosis not present

## 2015-07-24 DIAGNOSIS — R32 Unspecified urinary incontinence: Secondary | ICD-10-CM | POA: Diagnosis not present

## 2015-07-24 DIAGNOSIS — G834 Cauda equina syndrome: Secondary | ICD-10-CM | POA: Diagnosis not present

## 2015-07-24 DIAGNOSIS — Z436 Encounter for attention to other artificial openings of urinary tract: Secondary | ICD-10-CM | POA: Diagnosis not present

## 2015-07-28 DIAGNOSIS — G834 Cauda equina syndrome: Secondary | ICD-10-CM | POA: Diagnosis not present

## 2015-07-28 DIAGNOSIS — Z436 Encounter for attention to other artificial openings of urinary tract: Secondary | ICD-10-CM | POA: Diagnosis not present

## 2015-07-28 DIAGNOSIS — G8250 Quadriplegia, unspecified: Secondary | ICD-10-CM | POA: Diagnosis not present

## 2015-07-28 DIAGNOSIS — R32 Unspecified urinary incontinence: Secondary | ICD-10-CM | POA: Diagnosis not present

## 2015-07-31 DIAGNOSIS — G8250 Quadriplegia, unspecified: Secondary | ICD-10-CM | POA: Diagnosis not present

## 2015-07-31 DIAGNOSIS — G834 Cauda equina syndrome: Secondary | ICD-10-CM | POA: Diagnosis not present

## 2015-07-31 DIAGNOSIS — R32 Unspecified urinary incontinence: Secondary | ICD-10-CM | POA: Diagnosis not present

## 2015-07-31 DIAGNOSIS — Z436 Encounter for attention to other artificial openings of urinary tract: Secondary | ICD-10-CM | POA: Diagnosis not present

## 2015-08-04 DIAGNOSIS — G834 Cauda equina syndrome: Secondary | ICD-10-CM | POA: Diagnosis not present

## 2015-08-04 DIAGNOSIS — R32 Unspecified urinary incontinence: Secondary | ICD-10-CM | POA: Diagnosis not present

## 2015-08-04 DIAGNOSIS — Z436 Encounter for attention to other artificial openings of urinary tract: Secondary | ICD-10-CM | POA: Diagnosis not present

## 2015-08-04 DIAGNOSIS — G8250 Quadriplegia, unspecified: Secondary | ICD-10-CM | POA: Diagnosis not present

## 2015-08-07 DIAGNOSIS — G834 Cauda equina syndrome: Secondary | ICD-10-CM | POA: Diagnosis not present

## 2015-08-07 DIAGNOSIS — G8250 Quadriplegia, unspecified: Secondary | ICD-10-CM | POA: Diagnosis not present

## 2015-08-07 DIAGNOSIS — Z436 Encounter for attention to other artificial openings of urinary tract: Secondary | ICD-10-CM | POA: Diagnosis not present

## 2015-08-07 DIAGNOSIS — R32 Unspecified urinary incontinence: Secondary | ICD-10-CM | POA: Diagnosis not present

## 2015-08-11 DIAGNOSIS — Z436 Encounter for attention to other artificial openings of urinary tract: Secondary | ICD-10-CM | POA: Diagnosis not present

## 2015-08-11 DIAGNOSIS — G834 Cauda equina syndrome: Secondary | ICD-10-CM | POA: Diagnosis not present

## 2015-08-11 DIAGNOSIS — G8250 Quadriplegia, unspecified: Secondary | ICD-10-CM | POA: Diagnosis not present

## 2015-08-11 DIAGNOSIS — R32 Unspecified urinary incontinence: Secondary | ICD-10-CM | POA: Diagnosis not present

## 2015-08-12 DIAGNOSIS — R32 Unspecified urinary incontinence: Secondary | ICD-10-CM | POA: Diagnosis not present

## 2015-08-12 DIAGNOSIS — Z436 Encounter for attention to other artificial openings of urinary tract: Secondary | ICD-10-CM | POA: Diagnosis not present

## 2015-08-12 DIAGNOSIS — G8250 Quadriplegia, unspecified: Secondary | ICD-10-CM | POA: Diagnosis not present

## 2015-08-12 DIAGNOSIS — G834 Cauda equina syndrome: Secondary | ICD-10-CM | POA: Diagnosis not present

## 2015-08-14 DIAGNOSIS — G834 Cauda equina syndrome: Secondary | ICD-10-CM | POA: Diagnosis not present

## 2015-08-14 DIAGNOSIS — Z436 Encounter for attention to other artificial openings of urinary tract: Secondary | ICD-10-CM | POA: Diagnosis not present

## 2015-08-14 DIAGNOSIS — G8250 Quadriplegia, unspecified: Secondary | ICD-10-CM | POA: Diagnosis not present

## 2015-08-14 DIAGNOSIS — R32 Unspecified urinary incontinence: Secondary | ICD-10-CM | POA: Diagnosis not present

## 2015-08-18 DIAGNOSIS — Z436 Encounter for attention to other artificial openings of urinary tract: Secondary | ICD-10-CM | POA: Diagnosis not present

## 2015-08-18 DIAGNOSIS — G834 Cauda equina syndrome: Secondary | ICD-10-CM | POA: Diagnosis not present

## 2015-08-18 DIAGNOSIS — G8250 Quadriplegia, unspecified: Secondary | ICD-10-CM | POA: Diagnosis not present

## 2015-08-18 DIAGNOSIS — R32 Unspecified urinary incontinence: Secondary | ICD-10-CM | POA: Diagnosis not present

## 2015-08-21 DIAGNOSIS — G8250 Quadriplegia, unspecified: Secondary | ICD-10-CM | POA: Diagnosis not present

## 2015-08-21 DIAGNOSIS — R32 Unspecified urinary incontinence: Secondary | ICD-10-CM | POA: Diagnosis not present

## 2015-08-21 DIAGNOSIS — G834 Cauda equina syndrome: Secondary | ICD-10-CM | POA: Diagnosis not present

## 2015-08-21 DIAGNOSIS — Z436 Encounter for attention to other artificial openings of urinary tract: Secondary | ICD-10-CM | POA: Diagnosis not present

## 2015-08-22 DIAGNOSIS — R32 Unspecified urinary incontinence: Secondary | ICD-10-CM | POA: Diagnosis not present

## 2015-08-22 DIAGNOSIS — G834 Cauda equina syndrome: Secondary | ICD-10-CM | POA: Diagnosis not present

## 2015-08-22 DIAGNOSIS — G8250 Quadriplegia, unspecified: Secondary | ICD-10-CM | POA: Diagnosis not present

## 2015-08-22 DIAGNOSIS — Z436 Encounter for attention to other artificial openings of urinary tract: Secondary | ICD-10-CM | POA: Diagnosis not present

## 2015-08-25 DIAGNOSIS — G8250 Quadriplegia, unspecified: Secondary | ICD-10-CM | POA: Diagnosis not present

## 2015-08-25 DIAGNOSIS — G834 Cauda equina syndrome: Secondary | ICD-10-CM | POA: Diagnosis not present

## 2015-08-25 DIAGNOSIS — R32 Unspecified urinary incontinence: Secondary | ICD-10-CM | POA: Diagnosis not present

## 2015-08-25 DIAGNOSIS — Z436 Encounter for attention to other artificial openings of urinary tract: Secondary | ICD-10-CM | POA: Diagnosis not present

## 2015-08-28 DIAGNOSIS — Z436 Encounter for attention to other artificial openings of urinary tract: Secondary | ICD-10-CM | POA: Diagnosis not present

## 2015-08-28 DIAGNOSIS — G834 Cauda equina syndrome: Secondary | ICD-10-CM | POA: Diagnosis not present

## 2015-08-28 DIAGNOSIS — G8250 Quadriplegia, unspecified: Secondary | ICD-10-CM | POA: Diagnosis not present

## 2015-08-28 DIAGNOSIS — R32 Unspecified urinary incontinence: Secondary | ICD-10-CM | POA: Diagnosis not present

## 2015-09-01 DIAGNOSIS — R32 Unspecified urinary incontinence: Secondary | ICD-10-CM | POA: Diagnosis not present

## 2015-09-01 DIAGNOSIS — Z436 Encounter for attention to other artificial openings of urinary tract: Secondary | ICD-10-CM | POA: Diagnosis not present

## 2015-09-01 DIAGNOSIS — G834 Cauda equina syndrome: Secondary | ICD-10-CM | POA: Diagnosis not present

## 2015-09-01 DIAGNOSIS — G8250 Quadriplegia, unspecified: Secondary | ICD-10-CM | POA: Diagnosis not present

## 2015-09-04 DIAGNOSIS — G834 Cauda equina syndrome: Secondary | ICD-10-CM | POA: Diagnosis not present

## 2015-09-04 DIAGNOSIS — R32 Unspecified urinary incontinence: Secondary | ICD-10-CM | POA: Diagnosis not present

## 2015-09-04 DIAGNOSIS — Z436 Encounter for attention to other artificial openings of urinary tract: Secondary | ICD-10-CM | POA: Diagnosis not present

## 2015-09-04 DIAGNOSIS — G8250 Quadriplegia, unspecified: Secondary | ICD-10-CM | POA: Diagnosis not present

## 2015-09-08 DIAGNOSIS — Z436 Encounter for attention to other artificial openings of urinary tract: Secondary | ICD-10-CM | POA: Diagnosis not present

## 2015-09-08 DIAGNOSIS — G834 Cauda equina syndrome: Secondary | ICD-10-CM | POA: Diagnosis not present

## 2015-09-08 DIAGNOSIS — G8250 Quadriplegia, unspecified: Secondary | ICD-10-CM | POA: Diagnosis not present

## 2015-09-09 DIAGNOSIS — G834 Cauda equina syndrome: Secondary | ICD-10-CM | POA: Diagnosis not present

## 2015-09-09 DIAGNOSIS — R32 Unspecified urinary incontinence: Secondary | ICD-10-CM | POA: Diagnosis not present

## 2015-09-09 DIAGNOSIS — Z436 Encounter for attention to other artificial openings of urinary tract: Secondary | ICD-10-CM | POA: Diagnosis not present

## 2015-09-09 DIAGNOSIS — G8250 Quadriplegia, unspecified: Secondary | ICD-10-CM | POA: Diagnosis not present

## 2015-09-11 DIAGNOSIS — Z436 Encounter for attention to other artificial openings of urinary tract: Secondary | ICD-10-CM | POA: Diagnosis not present

## 2015-09-11 DIAGNOSIS — G8250 Quadriplegia, unspecified: Secondary | ICD-10-CM | POA: Diagnosis not present

## 2015-09-11 DIAGNOSIS — G834 Cauda equina syndrome: Secondary | ICD-10-CM | POA: Diagnosis not present

## 2015-09-16 DIAGNOSIS — Z436 Encounter for attention to other artificial openings of urinary tract: Secondary | ICD-10-CM | POA: Diagnosis not present

## 2015-09-16 DIAGNOSIS — R32 Unspecified urinary incontinence: Secondary | ICD-10-CM | POA: Diagnosis not present

## 2015-09-16 DIAGNOSIS — G8250 Quadriplegia, unspecified: Secondary | ICD-10-CM | POA: Diagnosis not present

## 2015-09-16 DIAGNOSIS — G834 Cauda equina syndrome: Secondary | ICD-10-CM | POA: Diagnosis not present

## 2015-09-18 DIAGNOSIS — G8250 Quadriplegia, unspecified: Secondary | ICD-10-CM | POA: Diagnosis not present

## 2015-09-18 DIAGNOSIS — G834 Cauda equina syndrome: Secondary | ICD-10-CM | POA: Diagnosis not present

## 2015-09-18 DIAGNOSIS — Z436 Encounter for attention to other artificial openings of urinary tract: Secondary | ICD-10-CM | POA: Diagnosis not present

## 2015-09-22 DIAGNOSIS — G8250 Quadriplegia, unspecified: Secondary | ICD-10-CM | POA: Diagnosis not present

## 2015-09-22 DIAGNOSIS — Z436 Encounter for attention to other artificial openings of urinary tract: Secondary | ICD-10-CM | POA: Diagnosis not present

## 2015-09-22 DIAGNOSIS — G834 Cauda equina syndrome: Secondary | ICD-10-CM | POA: Diagnosis not present

## 2015-09-25 DIAGNOSIS — G8250 Quadriplegia, unspecified: Secondary | ICD-10-CM | POA: Diagnosis not present

## 2015-09-25 DIAGNOSIS — Z436 Encounter for attention to other artificial openings of urinary tract: Secondary | ICD-10-CM | POA: Diagnosis not present

## 2015-09-25 DIAGNOSIS — G834 Cauda equina syndrome: Secondary | ICD-10-CM | POA: Diagnosis not present

## 2015-09-29 DIAGNOSIS — Z436 Encounter for attention to other artificial openings of urinary tract: Secondary | ICD-10-CM | POA: Diagnosis not present

## 2015-09-29 DIAGNOSIS — G834 Cauda equina syndrome: Secondary | ICD-10-CM | POA: Diagnosis not present

## 2015-09-29 DIAGNOSIS — G8250 Quadriplegia, unspecified: Secondary | ICD-10-CM | POA: Diagnosis not present

## 2015-10-01 DIAGNOSIS — Z436 Encounter for attention to other artificial openings of urinary tract: Secondary | ICD-10-CM | POA: Diagnosis not present

## 2015-10-01 DIAGNOSIS — G834 Cauda equina syndrome: Secondary | ICD-10-CM | POA: Diagnosis not present

## 2015-10-01 DIAGNOSIS — G8250 Quadriplegia, unspecified: Secondary | ICD-10-CM | POA: Diagnosis not present

## 2015-10-02 DIAGNOSIS — G834 Cauda equina syndrome: Secondary | ICD-10-CM | POA: Diagnosis not present

## 2015-10-02 DIAGNOSIS — G8250 Quadriplegia, unspecified: Secondary | ICD-10-CM | POA: Diagnosis not present

## 2015-10-02 DIAGNOSIS — Z436 Encounter for attention to other artificial openings of urinary tract: Secondary | ICD-10-CM | POA: Diagnosis not present

## 2015-10-06 DIAGNOSIS — R32 Unspecified urinary incontinence: Secondary | ICD-10-CM | POA: Diagnosis not present

## 2015-10-06 DIAGNOSIS — G834 Cauda equina syndrome: Secondary | ICD-10-CM | POA: Diagnosis not present

## 2015-10-06 DIAGNOSIS — G8250 Quadriplegia, unspecified: Secondary | ICD-10-CM | POA: Diagnosis not present

## 2015-10-06 DIAGNOSIS — Z436 Encounter for attention to other artificial openings of urinary tract: Secondary | ICD-10-CM | POA: Diagnosis not present

## 2015-10-09 DIAGNOSIS — G8250 Quadriplegia, unspecified: Secondary | ICD-10-CM | POA: Diagnosis not present

## 2015-10-09 DIAGNOSIS — Z436 Encounter for attention to other artificial openings of urinary tract: Secondary | ICD-10-CM | POA: Diagnosis not present

## 2015-10-09 DIAGNOSIS — G834 Cauda equina syndrome: Secondary | ICD-10-CM | POA: Diagnosis not present

## 2015-10-09 DIAGNOSIS — R32 Unspecified urinary incontinence: Secondary | ICD-10-CM | POA: Diagnosis not present

## 2015-10-13 DIAGNOSIS — Z436 Encounter for attention to other artificial openings of urinary tract: Secondary | ICD-10-CM | POA: Diagnosis not present

## 2015-10-13 DIAGNOSIS — G8250 Quadriplegia, unspecified: Secondary | ICD-10-CM | POA: Diagnosis not present

## 2015-10-13 DIAGNOSIS — G834 Cauda equina syndrome: Secondary | ICD-10-CM | POA: Diagnosis not present

## 2015-10-13 DIAGNOSIS — R32 Unspecified urinary incontinence: Secondary | ICD-10-CM | POA: Diagnosis not present

## 2015-10-16 DIAGNOSIS — Z436 Encounter for attention to other artificial openings of urinary tract: Secondary | ICD-10-CM | POA: Diagnosis not present

## 2015-10-16 DIAGNOSIS — G8250 Quadriplegia, unspecified: Secondary | ICD-10-CM | POA: Diagnosis not present

## 2015-10-16 DIAGNOSIS — R32 Unspecified urinary incontinence: Secondary | ICD-10-CM | POA: Diagnosis not present

## 2015-10-16 DIAGNOSIS — G834 Cauda equina syndrome: Secondary | ICD-10-CM | POA: Diagnosis not present

## 2015-10-20 DIAGNOSIS — G834 Cauda equina syndrome: Secondary | ICD-10-CM | POA: Diagnosis not present

## 2015-10-20 DIAGNOSIS — G8250 Quadriplegia, unspecified: Secondary | ICD-10-CM | POA: Diagnosis not present

## 2015-10-20 DIAGNOSIS — Z436 Encounter for attention to other artificial openings of urinary tract: Secondary | ICD-10-CM | POA: Diagnosis not present

## 2015-10-20 DIAGNOSIS — R32 Unspecified urinary incontinence: Secondary | ICD-10-CM | POA: Diagnosis not present

## 2015-10-21 DIAGNOSIS — Z436 Encounter for attention to other artificial openings of urinary tract: Secondary | ICD-10-CM | POA: Diagnosis not present

## 2015-10-21 DIAGNOSIS — R32 Unspecified urinary incontinence: Secondary | ICD-10-CM | POA: Diagnosis not present

## 2015-10-21 DIAGNOSIS — G8250 Quadriplegia, unspecified: Secondary | ICD-10-CM | POA: Diagnosis not present

## 2015-10-21 DIAGNOSIS — G834 Cauda equina syndrome: Secondary | ICD-10-CM | POA: Diagnosis not present

## 2015-10-23 DIAGNOSIS — G834 Cauda equina syndrome: Secondary | ICD-10-CM | POA: Diagnosis not present

## 2015-10-23 DIAGNOSIS — Z436 Encounter for attention to other artificial openings of urinary tract: Secondary | ICD-10-CM | POA: Diagnosis not present

## 2015-10-23 DIAGNOSIS — G8250 Quadriplegia, unspecified: Secondary | ICD-10-CM | POA: Diagnosis not present

## 2015-10-23 DIAGNOSIS — R32 Unspecified urinary incontinence: Secondary | ICD-10-CM | POA: Diagnosis not present

## 2015-10-24 DIAGNOSIS — E784 Other hyperlipidemia: Secondary | ICD-10-CM | POA: Diagnosis not present

## 2015-10-24 DIAGNOSIS — I10 Essential (primary) hypertension: Secondary | ICD-10-CM | POA: Diagnosis not present

## 2015-10-24 DIAGNOSIS — K029 Dental caries, unspecified: Secondary | ICD-10-CM | POA: Diagnosis not present

## 2015-10-24 DIAGNOSIS — S129XXA Fracture of neck, unspecified, initial encounter: Secondary | ICD-10-CM | POA: Diagnosis not present

## 2015-10-24 DIAGNOSIS — R5381 Other malaise: Secondary | ICD-10-CM | POA: Diagnosis not present

## 2015-10-27 DIAGNOSIS — Z436 Encounter for attention to other artificial openings of urinary tract: Secondary | ICD-10-CM | POA: Diagnosis not present

## 2015-10-27 DIAGNOSIS — G834 Cauda equina syndrome: Secondary | ICD-10-CM | POA: Diagnosis not present

## 2015-10-27 DIAGNOSIS — G8250 Quadriplegia, unspecified: Secondary | ICD-10-CM | POA: Diagnosis not present

## 2015-10-27 DIAGNOSIS — R32 Unspecified urinary incontinence: Secondary | ICD-10-CM | POA: Diagnosis not present

## 2015-10-28 DIAGNOSIS — Z436 Encounter for attention to other artificial openings of urinary tract: Secondary | ICD-10-CM | POA: Diagnosis not present

## 2015-10-30 DIAGNOSIS — G834 Cauda equina syndrome: Secondary | ICD-10-CM | POA: Diagnosis not present

## 2015-10-30 DIAGNOSIS — G8250 Quadriplegia, unspecified: Secondary | ICD-10-CM | POA: Diagnosis not present

## 2015-10-30 DIAGNOSIS — Z436 Encounter for attention to other artificial openings of urinary tract: Secondary | ICD-10-CM | POA: Diagnosis not present

## 2015-10-30 DIAGNOSIS — R32 Unspecified urinary incontinence: Secondary | ICD-10-CM | POA: Diagnosis not present

## 2015-11-03 DIAGNOSIS — G8250 Quadriplegia, unspecified: Secondary | ICD-10-CM | POA: Diagnosis not present

## 2015-11-03 DIAGNOSIS — Z436 Encounter for attention to other artificial openings of urinary tract: Secondary | ICD-10-CM | POA: Diagnosis not present

## 2015-11-03 DIAGNOSIS — G834 Cauda equina syndrome: Secondary | ICD-10-CM | POA: Diagnosis not present

## 2015-11-06 DIAGNOSIS — Z436 Encounter for attention to other artificial openings of urinary tract: Secondary | ICD-10-CM | POA: Diagnosis not present

## 2015-11-06 DIAGNOSIS — G834 Cauda equina syndrome: Secondary | ICD-10-CM | POA: Diagnosis not present

## 2015-11-06 DIAGNOSIS — G8250 Quadriplegia, unspecified: Secondary | ICD-10-CM | POA: Diagnosis not present

## 2015-11-10 DIAGNOSIS — G834 Cauda equina syndrome: Secondary | ICD-10-CM | POA: Diagnosis not present

## 2015-11-10 DIAGNOSIS — G8250 Quadriplegia, unspecified: Secondary | ICD-10-CM | POA: Diagnosis not present

## 2015-11-10 DIAGNOSIS — Z436 Encounter for attention to other artificial openings of urinary tract: Secondary | ICD-10-CM | POA: Diagnosis not present

## 2015-11-11 DIAGNOSIS — N390 Urinary tract infection, site not specified: Secondary | ICD-10-CM | POA: Diagnosis not present

## 2015-11-11 DIAGNOSIS — G8250 Quadriplegia, unspecified: Secondary | ICD-10-CM | POA: Diagnosis not present

## 2015-11-11 DIAGNOSIS — G834 Cauda equina syndrome: Secondary | ICD-10-CM | POA: Diagnosis not present

## 2015-11-11 DIAGNOSIS — Z436 Encounter for attention to other artificial openings of urinary tract: Secondary | ICD-10-CM | POA: Diagnosis not present

## 2015-11-13 DIAGNOSIS — G834 Cauda equina syndrome: Secondary | ICD-10-CM | POA: Diagnosis not present

## 2015-11-13 DIAGNOSIS — Z436 Encounter for attention to other artificial openings of urinary tract: Secondary | ICD-10-CM | POA: Diagnosis not present

## 2015-11-13 DIAGNOSIS — G8250 Quadriplegia, unspecified: Secondary | ICD-10-CM | POA: Diagnosis not present

## 2015-11-17 DIAGNOSIS — G834 Cauda equina syndrome: Secondary | ICD-10-CM | POA: Diagnosis not present

## 2015-11-17 DIAGNOSIS — G8250 Quadriplegia, unspecified: Secondary | ICD-10-CM | POA: Diagnosis not present

## 2015-11-17 DIAGNOSIS — Z436 Encounter for attention to other artificial openings of urinary tract: Secondary | ICD-10-CM | POA: Diagnosis not present

## 2015-11-20 DIAGNOSIS — G834 Cauda equina syndrome: Secondary | ICD-10-CM | POA: Diagnosis not present

## 2015-11-20 DIAGNOSIS — G8250 Quadriplegia, unspecified: Secondary | ICD-10-CM | POA: Diagnosis not present

## 2015-11-20 DIAGNOSIS — Z436 Encounter for attention to other artificial openings of urinary tract: Secondary | ICD-10-CM | POA: Diagnosis not present

## 2015-11-24 DIAGNOSIS — Z436 Encounter for attention to other artificial openings of urinary tract: Secondary | ICD-10-CM | POA: Diagnosis not present

## 2015-11-24 DIAGNOSIS — G834 Cauda equina syndrome: Secondary | ICD-10-CM | POA: Diagnosis not present

## 2015-11-24 DIAGNOSIS — G8250 Quadriplegia, unspecified: Secondary | ICD-10-CM | POA: Diagnosis not present

## 2015-11-25 DIAGNOSIS — Z436 Encounter for attention to other artificial openings of urinary tract: Secondary | ICD-10-CM | POA: Diagnosis not present

## 2015-11-27 DIAGNOSIS — G834 Cauda equina syndrome: Secondary | ICD-10-CM | POA: Diagnosis not present

## 2015-11-27 DIAGNOSIS — G8250 Quadriplegia, unspecified: Secondary | ICD-10-CM | POA: Diagnosis not present

## 2015-11-27 DIAGNOSIS — Z436 Encounter for attention to other artificial openings of urinary tract: Secondary | ICD-10-CM | POA: Diagnosis not present

## 2015-12-01 DIAGNOSIS — G834 Cauda equina syndrome: Secondary | ICD-10-CM | POA: Diagnosis not present

## 2015-12-01 DIAGNOSIS — G8250 Quadriplegia, unspecified: Secondary | ICD-10-CM | POA: Diagnosis not present

## 2015-12-01 DIAGNOSIS — Z436 Encounter for attention to other artificial openings of urinary tract: Secondary | ICD-10-CM | POA: Diagnosis not present

## 2015-12-02 DIAGNOSIS — G8250 Quadriplegia, unspecified: Secondary | ICD-10-CM | POA: Diagnosis not present

## 2015-12-02 DIAGNOSIS — Z436 Encounter for attention to other artificial openings of urinary tract: Secondary | ICD-10-CM | POA: Diagnosis not present

## 2015-12-02 DIAGNOSIS — G834 Cauda equina syndrome: Secondary | ICD-10-CM | POA: Diagnosis not present

## 2015-12-04 DIAGNOSIS — G8250 Quadriplegia, unspecified: Secondary | ICD-10-CM | POA: Diagnosis not present

## 2015-12-04 DIAGNOSIS — G834 Cauda equina syndrome: Secondary | ICD-10-CM | POA: Diagnosis not present

## 2015-12-04 DIAGNOSIS — Z436 Encounter for attention to other artificial openings of urinary tract: Secondary | ICD-10-CM | POA: Diagnosis not present

## 2015-12-08 DIAGNOSIS — Z436 Encounter for attention to other artificial openings of urinary tract: Secondary | ICD-10-CM | POA: Diagnosis not present

## 2015-12-08 DIAGNOSIS — G8250 Quadriplegia, unspecified: Secondary | ICD-10-CM | POA: Diagnosis not present

## 2015-12-08 DIAGNOSIS — G834 Cauda equina syndrome: Secondary | ICD-10-CM | POA: Diagnosis not present

## 2015-12-11 DIAGNOSIS — G834 Cauda equina syndrome: Secondary | ICD-10-CM | POA: Diagnosis not present

## 2015-12-11 DIAGNOSIS — G8250 Quadriplegia, unspecified: Secondary | ICD-10-CM | POA: Diagnosis not present

## 2015-12-11 DIAGNOSIS — Z436 Encounter for attention to other artificial openings of urinary tract: Secondary | ICD-10-CM | POA: Diagnosis not present

## 2015-12-15 DIAGNOSIS — Z436 Encounter for attention to other artificial openings of urinary tract: Secondary | ICD-10-CM | POA: Diagnosis not present

## 2015-12-15 DIAGNOSIS — G8250 Quadriplegia, unspecified: Secondary | ICD-10-CM | POA: Diagnosis not present

## 2015-12-15 DIAGNOSIS — G834 Cauda equina syndrome: Secondary | ICD-10-CM | POA: Diagnosis not present

## 2015-12-18 DIAGNOSIS — G834 Cauda equina syndrome: Secondary | ICD-10-CM | POA: Diagnosis not present

## 2015-12-18 DIAGNOSIS — G8250 Quadriplegia, unspecified: Secondary | ICD-10-CM | POA: Diagnosis not present

## 2015-12-18 DIAGNOSIS — Z436 Encounter for attention to other artificial openings of urinary tract: Secondary | ICD-10-CM | POA: Diagnosis not present

## 2015-12-19 DIAGNOSIS — Z436 Encounter for attention to other artificial openings of urinary tract: Secondary | ICD-10-CM | POA: Diagnosis not present

## 2015-12-21 DIAGNOSIS — Z436 Encounter for attention to other artificial openings of urinary tract: Secondary | ICD-10-CM | POA: Diagnosis not present

## 2015-12-21 DIAGNOSIS — G8250 Quadriplegia, unspecified: Secondary | ICD-10-CM | POA: Diagnosis not present

## 2015-12-21 DIAGNOSIS — G834 Cauda equina syndrome: Secondary | ICD-10-CM | POA: Diagnosis not present

## 2015-12-22 DIAGNOSIS — G8250 Quadriplegia, unspecified: Secondary | ICD-10-CM | POA: Diagnosis not present

## 2015-12-22 DIAGNOSIS — Z436 Encounter for attention to other artificial openings of urinary tract: Secondary | ICD-10-CM | POA: Diagnosis not present

## 2015-12-22 DIAGNOSIS — G834 Cauda equina syndrome: Secondary | ICD-10-CM | POA: Diagnosis not present

## 2015-12-25 DIAGNOSIS — Z436 Encounter for attention to other artificial openings of urinary tract: Secondary | ICD-10-CM | POA: Diagnosis not present

## 2015-12-25 DIAGNOSIS — G834 Cauda equina syndrome: Secondary | ICD-10-CM | POA: Diagnosis not present

## 2015-12-25 DIAGNOSIS — G8250 Quadriplegia, unspecified: Secondary | ICD-10-CM | POA: Diagnosis not present

## 2015-12-26 DIAGNOSIS — G834 Cauda equina syndrome: Secondary | ICD-10-CM | POA: Diagnosis not present

## 2015-12-26 DIAGNOSIS — Z436 Encounter for attention to other artificial openings of urinary tract: Secondary | ICD-10-CM | POA: Diagnosis not present

## 2015-12-26 DIAGNOSIS — N390 Urinary tract infection, site not specified: Secondary | ICD-10-CM | POA: Diagnosis not present

## 2015-12-26 DIAGNOSIS — G8250 Quadriplegia, unspecified: Secondary | ICD-10-CM | POA: Diagnosis not present

## 2015-12-29 DIAGNOSIS — G8250 Quadriplegia, unspecified: Secondary | ICD-10-CM | POA: Diagnosis not present

## 2015-12-29 DIAGNOSIS — G834 Cauda equina syndrome: Secondary | ICD-10-CM | POA: Diagnosis not present

## 2015-12-29 DIAGNOSIS — Z436 Encounter for attention to other artificial openings of urinary tract: Secondary | ICD-10-CM | POA: Diagnosis not present

## 2016-01-01 DIAGNOSIS — G8250 Quadriplegia, unspecified: Secondary | ICD-10-CM | POA: Diagnosis not present

## 2016-01-01 DIAGNOSIS — G834 Cauda equina syndrome: Secondary | ICD-10-CM | POA: Diagnosis not present

## 2016-01-01 DIAGNOSIS — Z436 Encounter for attention to other artificial openings of urinary tract: Secondary | ICD-10-CM | POA: Diagnosis not present

## 2016-01-02 DIAGNOSIS — G8250 Quadriplegia, unspecified: Secondary | ICD-10-CM | POA: Diagnosis not present

## 2016-01-02 DIAGNOSIS — Z436 Encounter for attention to other artificial openings of urinary tract: Secondary | ICD-10-CM | POA: Diagnosis not present

## 2016-01-02 DIAGNOSIS — G834 Cauda equina syndrome: Secondary | ICD-10-CM | POA: Diagnosis not present

## 2016-01-05 DIAGNOSIS — G8250 Quadriplegia, unspecified: Secondary | ICD-10-CM | POA: Diagnosis not present

## 2016-01-05 DIAGNOSIS — Z436 Encounter for attention to other artificial openings of urinary tract: Secondary | ICD-10-CM | POA: Diagnosis not present

## 2016-01-05 DIAGNOSIS — G834 Cauda equina syndrome: Secondary | ICD-10-CM | POA: Diagnosis not present

## 2016-01-06 DIAGNOSIS — R5381 Other malaise: Secondary | ICD-10-CM | POA: Diagnosis not present

## 2016-01-06 DIAGNOSIS — S129XXA Fracture of neck, unspecified, initial encounter: Secondary | ICD-10-CM | POA: Diagnosis not present

## 2016-01-06 DIAGNOSIS — L89323 Pressure ulcer of left buttock, stage 3: Secondary | ICD-10-CM | POA: Diagnosis not present

## 2016-01-06 DIAGNOSIS — N3010 Interstitial cystitis (chronic) without hematuria: Secondary | ICD-10-CM | POA: Diagnosis not present

## 2016-01-08 DIAGNOSIS — Z436 Encounter for attention to other artificial openings of urinary tract: Secondary | ICD-10-CM | POA: Diagnosis not present

## 2016-01-08 DIAGNOSIS — G8250 Quadriplegia, unspecified: Secondary | ICD-10-CM | POA: Diagnosis not present

## 2016-01-08 DIAGNOSIS — G834 Cauda equina syndrome: Secondary | ICD-10-CM | POA: Diagnosis not present

## 2016-01-09 DIAGNOSIS — Z436 Encounter for attention to other artificial openings of urinary tract: Secondary | ICD-10-CM | POA: Diagnosis not present

## 2016-01-12 DIAGNOSIS — G834 Cauda equina syndrome: Secondary | ICD-10-CM | POA: Diagnosis not present

## 2016-01-12 DIAGNOSIS — G8250 Quadriplegia, unspecified: Secondary | ICD-10-CM | POA: Diagnosis not present

## 2016-01-12 DIAGNOSIS — Z436 Encounter for attention to other artificial openings of urinary tract: Secondary | ICD-10-CM | POA: Diagnosis not present

## 2016-01-13 DIAGNOSIS — Z436 Encounter for attention to other artificial openings of urinary tract: Secondary | ICD-10-CM | POA: Diagnosis not present

## 2016-01-13 DIAGNOSIS — G834 Cauda equina syndrome: Secondary | ICD-10-CM | POA: Diagnosis not present

## 2016-01-13 DIAGNOSIS — G8250 Quadriplegia, unspecified: Secondary | ICD-10-CM | POA: Diagnosis not present

## 2016-01-14 DIAGNOSIS — Z436 Encounter for attention to other artificial openings of urinary tract: Secondary | ICD-10-CM | POA: Diagnosis not present

## 2016-01-15 DIAGNOSIS — G8250 Quadriplegia, unspecified: Secondary | ICD-10-CM | POA: Diagnosis not present

## 2016-01-15 DIAGNOSIS — Z436 Encounter for attention to other artificial openings of urinary tract: Secondary | ICD-10-CM | POA: Diagnosis not present

## 2016-01-15 DIAGNOSIS — G834 Cauda equina syndrome: Secondary | ICD-10-CM | POA: Diagnosis not present

## 2016-01-16 DIAGNOSIS — G8250 Quadriplegia, unspecified: Secondary | ICD-10-CM | POA: Diagnosis not present

## 2016-01-16 DIAGNOSIS — G834 Cauda equina syndrome: Secondary | ICD-10-CM | POA: Diagnosis not present

## 2016-01-16 DIAGNOSIS — Z436 Encounter for attention to other artificial openings of urinary tract: Secondary | ICD-10-CM | POA: Diagnosis not present

## 2016-01-16 DIAGNOSIS — N390 Urinary tract infection, site not specified: Secondary | ICD-10-CM | POA: Diagnosis not present

## 2016-01-19 DIAGNOSIS — G8250 Quadriplegia, unspecified: Secondary | ICD-10-CM | POA: Diagnosis not present

## 2016-01-19 DIAGNOSIS — Z436 Encounter for attention to other artificial openings of urinary tract: Secondary | ICD-10-CM | POA: Diagnosis not present

## 2016-01-19 DIAGNOSIS — G834 Cauda equina syndrome: Secondary | ICD-10-CM | POA: Diagnosis not present

## 2016-01-20 DIAGNOSIS — G8250 Quadriplegia, unspecified: Secondary | ICD-10-CM | POA: Diagnosis not present

## 2016-01-20 DIAGNOSIS — G834 Cauda equina syndrome: Secondary | ICD-10-CM | POA: Diagnosis not present

## 2016-01-20 DIAGNOSIS — Z436 Encounter for attention to other artificial openings of urinary tract: Secondary | ICD-10-CM | POA: Diagnosis not present

## 2016-01-22 DIAGNOSIS — G8250 Quadriplegia, unspecified: Secondary | ICD-10-CM | POA: Diagnosis not present

## 2016-01-22 DIAGNOSIS — G834 Cauda equina syndrome: Secondary | ICD-10-CM | POA: Diagnosis not present

## 2016-01-22 DIAGNOSIS — Z436 Encounter for attention to other artificial openings of urinary tract: Secondary | ICD-10-CM | POA: Diagnosis not present

## 2016-01-26 DIAGNOSIS — G8250 Quadriplegia, unspecified: Secondary | ICD-10-CM | POA: Diagnosis not present

## 2016-01-26 DIAGNOSIS — Z436 Encounter for attention to other artificial openings of urinary tract: Secondary | ICD-10-CM | POA: Diagnosis not present

## 2016-01-26 DIAGNOSIS — G834 Cauda equina syndrome: Secondary | ICD-10-CM | POA: Diagnosis not present

## 2016-01-27 DIAGNOSIS — G8250 Quadriplegia, unspecified: Secondary | ICD-10-CM | POA: Diagnosis not present

## 2016-01-27 DIAGNOSIS — Z436 Encounter for attention to other artificial openings of urinary tract: Secondary | ICD-10-CM | POA: Diagnosis not present

## 2016-01-27 DIAGNOSIS — G834 Cauda equina syndrome: Secondary | ICD-10-CM | POA: Diagnosis not present

## 2016-01-28 DIAGNOSIS — G834 Cauda equina syndrome: Secondary | ICD-10-CM | POA: Diagnosis not present

## 2016-01-28 DIAGNOSIS — G8250 Quadriplegia, unspecified: Secondary | ICD-10-CM | POA: Diagnosis not present

## 2016-01-28 DIAGNOSIS — Z436 Encounter for attention to other artificial openings of urinary tract: Secondary | ICD-10-CM | POA: Diagnosis not present

## 2016-02-02 DIAGNOSIS — Z436 Encounter for attention to other artificial openings of urinary tract: Secondary | ICD-10-CM | POA: Diagnosis not present

## 2016-02-02 DIAGNOSIS — G8250 Quadriplegia, unspecified: Secondary | ICD-10-CM | POA: Diagnosis not present

## 2016-02-02 DIAGNOSIS — G834 Cauda equina syndrome: Secondary | ICD-10-CM | POA: Diagnosis not present

## 2016-02-03 DIAGNOSIS — G834 Cauda equina syndrome: Secondary | ICD-10-CM | POA: Diagnosis not present

## 2016-02-03 DIAGNOSIS — G8250 Quadriplegia, unspecified: Secondary | ICD-10-CM | POA: Diagnosis not present

## 2016-02-03 DIAGNOSIS — Z436 Encounter for attention to other artificial openings of urinary tract: Secondary | ICD-10-CM | POA: Diagnosis not present

## 2016-02-05 DIAGNOSIS — G8250 Quadriplegia, unspecified: Secondary | ICD-10-CM | POA: Diagnosis not present

## 2016-02-05 DIAGNOSIS — Z436 Encounter for attention to other artificial openings of urinary tract: Secondary | ICD-10-CM | POA: Diagnosis not present

## 2016-02-05 DIAGNOSIS — G834 Cauda equina syndrome: Secondary | ICD-10-CM | POA: Diagnosis not present

## 2016-02-06 DIAGNOSIS — G8250 Quadriplegia, unspecified: Secondary | ICD-10-CM | POA: Diagnosis not present

## 2016-02-06 DIAGNOSIS — Z436 Encounter for attention to other artificial openings of urinary tract: Secondary | ICD-10-CM | POA: Diagnosis not present

## 2016-02-06 DIAGNOSIS — G834 Cauda equina syndrome: Secondary | ICD-10-CM | POA: Diagnosis not present

## 2016-02-09 DIAGNOSIS — G834 Cauda equina syndrome: Secondary | ICD-10-CM | POA: Diagnosis not present

## 2016-02-09 DIAGNOSIS — G8250 Quadriplegia, unspecified: Secondary | ICD-10-CM | POA: Diagnosis not present

## 2016-02-09 DIAGNOSIS — Z436 Encounter for attention to other artificial openings of urinary tract: Secondary | ICD-10-CM | POA: Diagnosis not present

## 2016-02-10 DIAGNOSIS — G834 Cauda equina syndrome: Secondary | ICD-10-CM | POA: Diagnosis not present

## 2016-02-10 DIAGNOSIS — Z436 Encounter for attention to other artificial openings of urinary tract: Secondary | ICD-10-CM | POA: Diagnosis not present

## 2016-02-10 DIAGNOSIS — G8250 Quadriplegia, unspecified: Secondary | ICD-10-CM | POA: Diagnosis not present

## 2016-02-12 DIAGNOSIS — Z436 Encounter for attention to other artificial openings of urinary tract: Secondary | ICD-10-CM | POA: Diagnosis not present

## 2016-02-12 DIAGNOSIS — G834 Cauda equina syndrome: Secondary | ICD-10-CM | POA: Diagnosis not present

## 2016-02-12 DIAGNOSIS — G8250 Quadriplegia, unspecified: Secondary | ICD-10-CM | POA: Diagnosis not present

## 2016-02-16 DIAGNOSIS — Z436 Encounter for attention to other artificial openings of urinary tract: Secondary | ICD-10-CM | POA: Diagnosis not present

## 2016-02-16 DIAGNOSIS — G8250 Quadriplegia, unspecified: Secondary | ICD-10-CM | POA: Diagnosis not present

## 2016-02-16 DIAGNOSIS — G834 Cauda equina syndrome: Secondary | ICD-10-CM | POA: Diagnosis not present

## 2016-02-17 DIAGNOSIS — G834 Cauda equina syndrome: Secondary | ICD-10-CM | POA: Diagnosis not present

## 2016-02-17 DIAGNOSIS — Z436 Encounter for attention to other artificial openings of urinary tract: Secondary | ICD-10-CM | POA: Diagnosis not present

## 2016-02-17 DIAGNOSIS — G8250 Quadriplegia, unspecified: Secondary | ICD-10-CM | POA: Diagnosis not present

## 2016-02-19 DIAGNOSIS — G8250 Quadriplegia, unspecified: Secondary | ICD-10-CM | POA: Diagnosis not present

## 2016-02-19 DIAGNOSIS — G834 Cauda equina syndrome: Secondary | ICD-10-CM | POA: Diagnosis not present

## 2016-02-19 DIAGNOSIS — Z436 Encounter for attention to other artificial openings of urinary tract: Secondary | ICD-10-CM | POA: Diagnosis not present

## 2016-02-23 DIAGNOSIS — Z436 Encounter for attention to other artificial openings of urinary tract: Secondary | ICD-10-CM | POA: Diagnosis not present

## 2016-02-23 DIAGNOSIS — G8250 Quadriplegia, unspecified: Secondary | ICD-10-CM | POA: Diagnosis not present

## 2016-02-23 DIAGNOSIS — G834 Cauda equina syndrome: Secondary | ICD-10-CM | POA: Diagnosis not present

## 2016-02-24 DIAGNOSIS — Z436 Encounter for attention to other artificial openings of urinary tract: Secondary | ICD-10-CM | POA: Diagnosis not present

## 2016-02-25 DIAGNOSIS — G834 Cauda equina syndrome: Secondary | ICD-10-CM | POA: Diagnosis not present

## 2016-02-25 DIAGNOSIS — Z436 Encounter for attention to other artificial openings of urinary tract: Secondary | ICD-10-CM | POA: Diagnosis not present

## 2016-02-25 DIAGNOSIS — G8250 Quadriplegia, unspecified: Secondary | ICD-10-CM | POA: Diagnosis not present

## 2016-02-26 DIAGNOSIS — Z436 Encounter for attention to other artificial openings of urinary tract: Secondary | ICD-10-CM | POA: Diagnosis not present

## 2016-02-26 DIAGNOSIS — G8250 Quadriplegia, unspecified: Secondary | ICD-10-CM | POA: Diagnosis not present

## 2016-02-26 DIAGNOSIS — G834 Cauda equina syndrome: Secondary | ICD-10-CM | POA: Diagnosis not present

## 2016-03-02 DIAGNOSIS — Z436 Encounter for attention to other artificial openings of urinary tract: Secondary | ICD-10-CM | POA: Diagnosis not present

## 2016-03-02 DIAGNOSIS — G8250 Quadriplegia, unspecified: Secondary | ICD-10-CM | POA: Diagnosis not present

## 2016-03-02 DIAGNOSIS — G834 Cauda equina syndrome: Secondary | ICD-10-CM | POA: Diagnosis not present

## 2016-03-04 DIAGNOSIS — Z436 Encounter for attention to other artificial openings of urinary tract: Secondary | ICD-10-CM | POA: Diagnosis not present

## 2016-03-04 DIAGNOSIS — G8250 Quadriplegia, unspecified: Secondary | ICD-10-CM | POA: Diagnosis not present

## 2016-03-04 DIAGNOSIS — G834 Cauda equina syndrome: Secondary | ICD-10-CM | POA: Diagnosis not present

## 2016-03-05 DIAGNOSIS — G834 Cauda equina syndrome: Secondary | ICD-10-CM | POA: Diagnosis not present

## 2016-03-05 DIAGNOSIS — G8250 Quadriplegia, unspecified: Secondary | ICD-10-CM | POA: Diagnosis not present

## 2016-03-05 DIAGNOSIS — Z436 Encounter for attention to other artificial openings of urinary tract: Secondary | ICD-10-CM | POA: Diagnosis not present

## 2016-03-08 LAB — HM HIV SCREENING LAB: HM HIV Screening: NEGATIVE

## 2016-03-08 LAB — HM HEPATITIS C SCREENING LAB: HM Hepatitis Screen: NEGATIVE

## 2016-03-09 DIAGNOSIS — Z436 Encounter for attention to other artificial openings of urinary tract: Secondary | ICD-10-CM | POA: Diagnosis not present

## 2016-03-09 DIAGNOSIS — G834 Cauda equina syndrome: Secondary | ICD-10-CM | POA: Diagnosis not present

## 2016-03-09 DIAGNOSIS — G8250 Quadriplegia, unspecified: Secondary | ICD-10-CM | POA: Diagnosis not present

## 2016-03-11 DIAGNOSIS — Z436 Encounter for attention to other artificial openings of urinary tract: Secondary | ICD-10-CM | POA: Diagnosis not present

## 2016-03-11 DIAGNOSIS — G834 Cauda equina syndrome: Secondary | ICD-10-CM | POA: Diagnosis not present

## 2016-03-11 DIAGNOSIS — G8250 Quadriplegia, unspecified: Secondary | ICD-10-CM | POA: Diagnosis not present

## 2016-03-15 DIAGNOSIS — G8250 Quadriplegia, unspecified: Secondary | ICD-10-CM | POA: Diagnosis not present

## 2016-03-15 DIAGNOSIS — G834 Cauda equina syndrome: Secondary | ICD-10-CM | POA: Diagnosis not present

## 2016-03-15 DIAGNOSIS — Z436 Encounter for attention to other artificial openings of urinary tract: Secondary | ICD-10-CM | POA: Diagnosis not present

## 2016-03-16 DIAGNOSIS — Z436 Encounter for attention to other artificial openings of urinary tract: Secondary | ICD-10-CM | POA: Diagnosis not present

## 2016-03-18 DIAGNOSIS — G8250 Quadriplegia, unspecified: Secondary | ICD-10-CM | POA: Diagnosis not present

## 2016-03-18 DIAGNOSIS — Z436 Encounter for attention to other artificial openings of urinary tract: Secondary | ICD-10-CM | POA: Diagnosis not present

## 2016-03-18 DIAGNOSIS — G834 Cauda equina syndrome: Secondary | ICD-10-CM | POA: Diagnosis not present

## 2016-03-23 DIAGNOSIS — G834 Cauda equina syndrome: Secondary | ICD-10-CM | POA: Diagnosis not present

## 2016-03-23 DIAGNOSIS — Z436 Encounter for attention to other artificial openings of urinary tract: Secondary | ICD-10-CM | POA: Diagnosis not present

## 2016-03-23 DIAGNOSIS — G8250 Quadriplegia, unspecified: Secondary | ICD-10-CM | POA: Diagnosis not present

## 2016-03-26 DIAGNOSIS — Z436 Encounter for attention to other artificial openings of urinary tract: Secondary | ICD-10-CM | POA: Diagnosis not present

## 2016-03-26 DIAGNOSIS — G834 Cauda equina syndrome: Secondary | ICD-10-CM | POA: Diagnosis not present

## 2016-03-26 DIAGNOSIS — G8250 Quadriplegia, unspecified: Secondary | ICD-10-CM | POA: Diagnosis not present

## 2016-03-29 DIAGNOSIS — Z436 Encounter for attention to other artificial openings of urinary tract: Secondary | ICD-10-CM | POA: Diagnosis not present

## 2016-03-29 DIAGNOSIS — G8250 Quadriplegia, unspecified: Secondary | ICD-10-CM | POA: Diagnosis not present

## 2016-03-29 DIAGNOSIS — G834 Cauda equina syndrome: Secondary | ICD-10-CM | POA: Diagnosis not present

## 2016-04-01 DIAGNOSIS — G8250 Quadriplegia, unspecified: Secondary | ICD-10-CM | POA: Diagnosis not present

## 2016-04-01 DIAGNOSIS — Z436 Encounter for attention to other artificial openings of urinary tract: Secondary | ICD-10-CM | POA: Diagnosis not present

## 2016-04-01 DIAGNOSIS — G834 Cauda equina syndrome: Secondary | ICD-10-CM | POA: Diagnosis not present

## 2016-04-05 DIAGNOSIS — G834 Cauda equina syndrome: Secondary | ICD-10-CM | POA: Diagnosis not present

## 2016-04-05 DIAGNOSIS — Z436 Encounter for attention to other artificial openings of urinary tract: Secondary | ICD-10-CM | POA: Diagnosis not present

## 2016-04-05 DIAGNOSIS — G8250 Quadriplegia, unspecified: Secondary | ICD-10-CM | POA: Diagnosis not present

## 2016-04-06 DIAGNOSIS — G8250 Quadriplegia, unspecified: Secondary | ICD-10-CM | POA: Diagnosis not present

## 2016-04-06 DIAGNOSIS — Z436 Encounter for attention to other artificial openings of urinary tract: Secondary | ICD-10-CM | POA: Diagnosis not present

## 2016-04-06 DIAGNOSIS — G834 Cauda equina syndrome: Secondary | ICD-10-CM | POA: Diagnosis not present

## 2016-04-12 DIAGNOSIS — G8250 Quadriplegia, unspecified: Secondary | ICD-10-CM | POA: Diagnosis not present

## 2016-04-12 DIAGNOSIS — Z436 Encounter for attention to other artificial openings of urinary tract: Secondary | ICD-10-CM | POA: Diagnosis not present

## 2016-04-12 DIAGNOSIS — G834 Cauda equina syndrome: Secondary | ICD-10-CM | POA: Diagnosis not present

## 2016-04-13 DIAGNOSIS — Z0000 Encounter for general adult medical examination without abnormal findings: Secondary | ICD-10-CM | POA: Diagnosis not present

## 2016-04-14 DIAGNOSIS — Z436 Encounter for attention to other artificial openings of urinary tract: Secondary | ICD-10-CM | POA: Diagnosis not present

## 2016-04-15 DIAGNOSIS — G8250 Quadriplegia, unspecified: Secondary | ICD-10-CM | POA: Diagnosis not present

## 2016-04-15 DIAGNOSIS — I10 Essential (primary) hypertension: Secondary | ICD-10-CM | POA: Diagnosis not present

## 2016-04-15 DIAGNOSIS — G834 Cauda equina syndrome: Secondary | ICD-10-CM | POA: Diagnosis not present

## 2016-04-15 DIAGNOSIS — E784 Other hyperlipidemia: Secondary | ICD-10-CM | POA: Diagnosis not present

## 2016-04-15 DIAGNOSIS — Z436 Encounter for attention to other artificial openings of urinary tract: Secondary | ICD-10-CM | POA: Diagnosis not present

## 2016-04-19 DIAGNOSIS — G8250 Quadriplegia, unspecified: Secondary | ICD-10-CM | POA: Diagnosis not present

## 2016-04-19 DIAGNOSIS — Z436 Encounter for attention to other artificial openings of urinary tract: Secondary | ICD-10-CM | POA: Diagnosis not present

## 2016-04-19 DIAGNOSIS — G834 Cauda equina syndrome: Secondary | ICD-10-CM | POA: Diagnosis not present

## 2016-04-22 DIAGNOSIS — Z436 Encounter for attention to other artificial openings of urinary tract: Secondary | ICD-10-CM | POA: Diagnosis not present

## 2016-04-22 DIAGNOSIS — G8250 Quadriplegia, unspecified: Secondary | ICD-10-CM | POA: Diagnosis not present

## 2016-04-22 DIAGNOSIS — G834 Cauda equina syndrome: Secondary | ICD-10-CM | POA: Diagnosis not present

## 2016-04-26 DIAGNOSIS — Z436 Encounter for attention to other artificial openings of urinary tract: Secondary | ICD-10-CM | POA: Diagnosis not present

## 2016-04-26 DIAGNOSIS — N390 Urinary tract infection, site not specified: Secondary | ICD-10-CM | POA: Diagnosis not present

## 2016-04-26 DIAGNOSIS — G834 Cauda equina syndrome: Secondary | ICD-10-CM | POA: Diagnosis not present

## 2016-04-26 DIAGNOSIS — G8250 Quadriplegia, unspecified: Secondary | ICD-10-CM | POA: Diagnosis not present

## 2016-04-29 DIAGNOSIS — G8250 Quadriplegia, unspecified: Secondary | ICD-10-CM | POA: Diagnosis not present

## 2016-04-29 DIAGNOSIS — Z436 Encounter for attention to other artificial openings of urinary tract: Secondary | ICD-10-CM | POA: Diagnosis not present

## 2016-04-29 DIAGNOSIS — G834 Cauda equina syndrome: Secondary | ICD-10-CM | POA: Diagnosis not present

## 2016-04-30 DIAGNOSIS — B3741 Candidal cystitis and urethritis: Secondary | ICD-10-CM | POA: Diagnosis not present

## 2016-04-30 DIAGNOSIS — R3911 Hesitancy of micturition: Secondary | ICD-10-CM | POA: Diagnosis not present

## 2016-04-30 DIAGNOSIS — Z993 Dependence on wheelchair: Secondary | ICD-10-CM | POA: Diagnosis not present

## 2016-04-30 DIAGNOSIS — G8250 Quadriplegia, unspecified: Secondary | ICD-10-CM | POA: Diagnosis not present

## 2016-04-30 DIAGNOSIS — N3010 Interstitial cystitis (chronic) without hematuria: Secondary | ICD-10-CM | POA: Diagnosis not present

## 2016-04-30 DIAGNOSIS — K029 Dental caries, unspecified: Secondary | ICD-10-CM | POA: Diagnosis not present

## 2016-04-30 DIAGNOSIS — L89159 Pressure ulcer of sacral region, unspecified stage: Secondary | ICD-10-CM | POA: Diagnosis not present

## 2016-04-30 DIAGNOSIS — R5381 Other malaise: Secondary | ICD-10-CM | POA: Diagnosis not present

## 2016-04-30 DIAGNOSIS — N390 Urinary tract infection, site not specified: Secondary | ICD-10-CM | POA: Diagnosis not present

## 2016-05-03 DIAGNOSIS — G834 Cauda equina syndrome: Secondary | ICD-10-CM | POA: Diagnosis not present

## 2016-05-03 DIAGNOSIS — Z436 Encounter for attention to other artificial openings of urinary tract: Secondary | ICD-10-CM | POA: Diagnosis not present

## 2016-05-03 DIAGNOSIS — G8250 Quadriplegia, unspecified: Secondary | ICD-10-CM | POA: Diagnosis not present

## 2016-05-06 DIAGNOSIS — G8250 Quadriplegia, unspecified: Secondary | ICD-10-CM | POA: Diagnosis not present

## 2016-05-06 DIAGNOSIS — Z436 Encounter for attention to other artificial openings of urinary tract: Secondary | ICD-10-CM | POA: Diagnosis not present

## 2016-05-06 DIAGNOSIS — G834 Cauda equina syndrome: Secondary | ICD-10-CM | POA: Diagnosis not present

## 2016-05-11 DIAGNOSIS — Z436 Encounter for attention to other artificial openings of urinary tract: Secondary | ICD-10-CM | POA: Diagnosis not present

## 2016-05-11 DIAGNOSIS — G834 Cauda equina syndrome: Secondary | ICD-10-CM | POA: Diagnosis not present

## 2016-05-11 DIAGNOSIS — G8250 Quadriplegia, unspecified: Secondary | ICD-10-CM | POA: Diagnosis not present

## 2016-05-13 DIAGNOSIS — Z436 Encounter for attention to other artificial openings of urinary tract: Secondary | ICD-10-CM | POA: Diagnosis not present

## 2016-05-13 DIAGNOSIS — G834 Cauda equina syndrome: Secondary | ICD-10-CM | POA: Diagnosis not present

## 2016-05-13 DIAGNOSIS — G8250 Quadriplegia, unspecified: Secondary | ICD-10-CM | POA: Diagnosis not present

## 2016-05-17 DIAGNOSIS — Z436 Encounter for attention to other artificial openings of urinary tract: Secondary | ICD-10-CM | POA: Diagnosis not present

## 2016-05-17 DIAGNOSIS — G8250 Quadriplegia, unspecified: Secondary | ICD-10-CM | POA: Diagnosis not present

## 2016-05-17 DIAGNOSIS — G834 Cauda equina syndrome: Secondary | ICD-10-CM | POA: Diagnosis not present

## 2016-05-19 DIAGNOSIS — L89159 Pressure ulcer of sacral region, unspecified stage: Secondary | ICD-10-CM | POA: Diagnosis not present

## 2016-05-19 DIAGNOSIS — Z993 Dependence on wheelchair: Secondary | ICD-10-CM | POA: Diagnosis not present

## 2016-05-19 DIAGNOSIS — G8250 Quadriplegia, unspecified: Secondary | ICD-10-CM | POA: Diagnosis not present

## 2016-05-20 ENCOUNTER — Other Ambulatory Visit: Payer: Self-pay | Admitting: Internal Medicine

## 2016-05-20 DIAGNOSIS — Z436 Encounter for attention to other artificial openings of urinary tract: Secondary | ICD-10-CM | POA: Diagnosis not present

## 2016-05-20 DIAGNOSIS — G834 Cauda equina syndrome: Secondary | ICD-10-CM | POA: Diagnosis not present

## 2016-05-20 DIAGNOSIS — G8250 Quadriplegia, unspecified: Secondary | ICD-10-CM | POA: Diagnosis not present

## 2016-05-20 DIAGNOSIS — Z1231 Encounter for screening mammogram for malignant neoplasm of breast: Secondary | ICD-10-CM

## 2016-05-24 DIAGNOSIS — Z436 Encounter for attention to other artificial openings of urinary tract: Secondary | ICD-10-CM | POA: Diagnosis not present

## 2016-05-24 DIAGNOSIS — G8250 Quadriplegia, unspecified: Secondary | ICD-10-CM | POA: Diagnosis not present

## 2016-05-24 DIAGNOSIS — G834 Cauda equina syndrome: Secondary | ICD-10-CM | POA: Diagnosis not present

## 2016-05-27 DIAGNOSIS — Z436 Encounter for attention to other artificial openings of urinary tract: Secondary | ICD-10-CM | POA: Diagnosis not present

## 2016-05-27 DIAGNOSIS — G834 Cauda equina syndrome: Secondary | ICD-10-CM | POA: Diagnosis not present

## 2016-05-27 DIAGNOSIS — G8250 Quadriplegia, unspecified: Secondary | ICD-10-CM | POA: Diagnosis not present

## 2016-05-31 ENCOUNTER — Other Ambulatory Visit: Payer: Self-pay | Admitting: Internal Medicine

## 2016-05-31 DIAGNOSIS — M810 Age-related osteoporosis without current pathological fracture: Secondary | ICD-10-CM

## 2016-05-31 DIAGNOSIS — M8580 Other specified disorders of bone density and structure, unspecified site: Secondary | ICD-10-CM

## 2016-06-01 DIAGNOSIS — G834 Cauda equina syndrome: Secondary | ICD-10-CM | POA: Diagnosis not present

## 2016-06-01 DIAGNOSIS — Z436 Encounter for attention to other artificial openings of urinary tract: Secondary | ICD-10-CM | POA: Diagnosis not present

## 2016-06-01 DIAGNOSIS — G8250 Quadriplegia, unspecified: Secondary | ICD-10-CM | POA: Diagnosis not present

## 2016-06-03 DIAGNOSIS — G834 Cauda equina syndrome: Secondary | ICD-10-CM | POA: Diagnosis not present

## 2016-06-03 DIAGNOSIS — G8250 Quadriplegia, unspecified: Secondary | ICD-10-CM | POA: Diagnosis not present

## 2016-06-03 DIAGNOSIS — Z436 Encounter for attention to other artificial openings of urinary tract: Secondary | ICD-10-CM | POA: Diagnosis not present

## 2016-06-07 DIAGNOSIS — G8250 Quadriplegia, unspecified: Secondary | ICD-10-CM | POA: Diagnosis not present

## 2016-06-07 DIAGNOSIS — G834 Cauda equina syndrome: Secondary | ICD-10-CM | POA: Diagnosis not present

## 2016-06-07 DIAGNOSIS — Z436 Encounter for attention to other artificial openings of urinary tract: Secondary | ICD-10-CM | POA: Diagnosis not present

## 2016-06-08 DIAGNOSIS — G8250 Quadriplegia, unspecified: Secondary | ICD-10-CM | POA: Diagnosis not present

## 2016-06-08 DIAGNOSIS — G834 Cauda equina syndrome: Secondary | ICD-10-CM | POA: Diagnosis not present

## 2016-06-08 DIAGNOSIS — Z436 Encounter for attention to other artificial openings of urinary tract: Secondary | ICD-10-CM | POA: Diagnosis not present

## 2016-06-10 DIAGNOSIS — Z436 Encounter for attention to other artificial openings of urinary tract: Secondary | ICD-10-CM | POA: Diagnosis not present

## 2016-06-10 DIAGNOSIS — G8250 Quadriplegia, unspecified: Secondary | ICD-10-CM | POA: Diagnosis not present

## 2016-06-10 DIAGNOSIS — G834 Cauda equina syndrome: Secondary | ICD-10-CM | POA: Diagnosis not present

## 2016-06-14 DIAGNOSIS — Z436 Encounter for attention to other artificial openings of urinary tract: Secondary | ICD-10-CM | POA: Diagnosis not present

## 2016-06-14 DIAGNOSIS — G834 Cauda equina syndrome: Secondary | ICD-10-CM | POA: Diagnosis not present

## 2016-06-14 DIAGNOSIS — G8250 Quadriplegia, unspecified: Secondary | ICD-10-CM | POA: Diagnosis not present

## 2016-06-16 ENCOUNTER — Ambulatory Visit: Payer: Commercial Managed Care - HMO

## 2016-06-17 DIAGNOSIS — Z436 Encounter for attention to other artificial openings of urinary tract: Secondary | ICD-10-CM | POA: Diagnosis not present

## 2016-06-17 DIAGNOSIS — G834 Cauda equina syndrome: Secondary | ICD-10-CM | POA: Diagnosis not present

## 2016-06-17 DIAGNOSIS — G8250 Quadriplegia, unspecified: Secondary | ICD-10-CM | POA: Diagnosis not present

## 2016-06-21 DIAGNOSIS — Z436 Encounter for attention to other artificial openings of urinary tract: Secondary | ICD-10-CM | POA: Diagnosis not present

## 2016-06-21 DIAGNOSIS — G834 Cauda equina syndrome: Secondary | ICD-10-CM | POA: Diagnosis not present

## 2016-06-21 DIAGNOSIS — G8250 Quadriplegia, unspecified: Secondary | ICD-10-CM | POA: Diagnosis not present

## 2016-06-24 DIAGNOSIS — Z436 Encounter for attention to other artificial openings of urinary tract: Secondary | ICD-10-CM | POA: Diagnosis not present

## 2016-06-24 DIAGNOSIS — G8250 Quadriplegia, unspecified: Secondary | ICD-10-CM | POA: Diagnosis not present

## 2016-06-24 DIAGNOSIS — G834 Cauda equina syndrome: Secondary | ICD-10-CM | POA: Diagnosis not present

## 2016-06-28 DIAGNOSIS — G834 Cauda equina syndrome: Secondary | ICD-10-CM | POA: Diagnosis not present

## 2016-06-28 DIAGNOSIS — G8250 Quadriplegia, unspecified: Secondary | ICD-10-CM | POA: Diagnosis not present

## 2016-06-28 DIAGNOSIS — Z436 Encounter for attention to other artificial openings of urinary tract: Secondary | ICD-10-CM | POA: Diagnosis not present

## 2016-06-30 DIAGNOSIS — G834 Cauda equina syndrome: Secondary | ICD-10-CM | POA: Diagnosis not present

## 2016-06-30 DIAGNOSIS — G8250 Quadriplegia, unspecified: Secondary | ICD-10-CM | POA: Diagnosis not present

## 2016-06-30 DIAGNOSIS — Z436 Encounter for attention to other artificial openings of urinary tract: Secondary | ICD-10-CM | POA: Diagnosis not present

## 2016-07-01 DIAGNOSIS — G8250 Quadriplegia, unspecified: Secondary | ICD-10-CM | POA: Diagnosis not present

## 2016-07-01 DIAGNOSIS — Z436 Encounter for attention to other artificial openings of urinary tract: Secondary | ICD-10-CM | POA: Diagnosis not present

## 2016-07-01 DIAGNOSIS — G834 Cauda equina syndrome: Secondary | ICD-10-CM | POA: Diagnosis not present

## 2016-07-05 ENCOUNTER — Ambulatory Visit
Admission: RE | Admit: 2016-07-05 | Discharge: 2016-07-05 | Disposition: A | Discharge status: Home / Self Care | Payer: Medicare Other | Source: Ambulatory Visit | Attending: Internal Medicine | Admitting: Internal Medicine

## 2016-07-05 DIAGNOSIS — G8250 Quadriplegia, unspecified: Secondary | ICD-10-CM | POA: Diagnosis not present

## 2016-07-05 DIAGNOSIS — M810 Age-related osteoporosis without current pathological fracture: Secondary | ICD-10-CM | POA: Diagnosis not present

## 2016-07-05 DIAGNOSIS — M818 Other osteoporosis without current pathological fracture: Secondary | ICD-10-CM | POA: Diagnosis not present

## 2016-07-05 DIAGNOSIS — M8580 Other specified disorders of bone density and structure, unspecified site: Secondary | ICD-10-CM

## 2016-07-05 DIAGNOSIS — G834 Cauda equina syndrome: Secondary | ICD-10-CM | POA: Diagnosis not present

## 2016-07-05 DIAGNOSIS — Z1231 Encounter for screening mammogram for malignant neoplasm of breast: Secondary | ICD-10-CM | POA: Insufficient documentation

## 2016-07-05 DIAGNOSIS — Z436 Encounter for attention to other artificial openings of urinary tract: Secondary | ICD-10-CM | POA: Diagnosis not present

## 2016-07-08 DIAGNOSIS — G834 Cauda equina syndrome: Secondary | ICD-10-CM | POA: Diagnosis not present

## 2016-07-08 DIAGNOSIS — Z436 Encounter for attention to other artificial openings of urinary tract: Secondary | ICD-10-CM | POA: Diagnosis not present

## 2016-07-08 DIAGNOSIS — G8250 Quadriplegia, unspecified: Secondary | ICD-10-CM | POA: Diagnosis not present

## 2016-07-12 DIAGNOSIS — G834 Cauda equina syndrome: Secondary | ICD-10-CM | POA: Diagnosis not present

## 2016-07-12 DIAGNOSIS — Z436 Encounter for attention to other artificial openings of urinary tract: Secondary | ICD-10-CM | POA: Diagnosis not present

## 2016-07-12 DIAGNOSIS — G8250 Quadriplegia, unspecified: Secondary | ICD-10-CM | POA: Diagnosis not present

## 2016-07-15 DIAGNOSIS — Z436 Encounter for attention to other artificial openings of urinary tract: Secondary | ICD-10-CM | POA: Diagnosis not present

## 2016-07-15 DIAGNOSIS — G834 Cauda equina syndrome: Secondary | ICD-10-CM | POA: Diagnosis not present

## 2016-07-15 DIAGNOSIS — G8250 Quadriplegia, unspecified: Secondary | ICD-10-CM | POA: Diagnosis not present

## 2016-07-19 DIAGNOSIS — Z436 Encounter for attention to other artificial openings of urinary tract: Secondary | ICD-10-CM | POA: Diagnosis not present

## 2016-07-19 DIAGNOSIS — G8250 Quadriplegia, unspecified: Secondary | ICD-10-CM | POA: Diagnosis not present

## 2016-07-19 DIAGNOSIS — G834 Cauda equina syndrome: Secondary | ICD-10-CM | POA: Diagnosis not present

## 2016-07-20 DIAGNOSIS — G8250 Quadriplegia, unspecified: Secondary | ICD-10-CM | POA: Diagnosis not present

## 2016-07-20 DIAGNOSIS — G834 Cauda equina syndrome: Secondary | ICD-10-CM | POA: Diagnosis not present

## 2016-07-20 DIAGNOSIS — N390 Urinary tract infection, site not specified: Secondary | ICD-10-CM | POA: Diagnosis not present

## 2016-07-20 DIAGNOSIS — Z436 Encounter for attention to other artificial openings of urinary tract: Secondary | ICD-10-CM | POA: Diagnosis not present

## 2016-07-22 DIAGNOSIS — G834 Cauda equina syndrome: Secondary | ICD-10-CM | POA: Diagnosis not present

## 2016-07-22 DIAGNOSIS — G8250 Quadriplegia, unspecified: Secondary | ICD-10-CM | POA: Diagnosis not present

## 2016-07-22 DIAGNOSIS — Z436 Encounter for attention to other artificial openings of urinary tract: Secondary | ICD-10-CM | POA: Diagnosis not present

## 2016-07-23 DIAGNOSIS — Z0000 Encounter for general adult medical examination without abnormal findings: Secondary | ICD-10-CM | POA: Diagnosis not present

## 2016-07-23 DIAGNOSIS — R195 Other fecal abnormalities: Secondary | ICD-10-CM | POA: Diagnosis not present

## 2016-07-23 DIAGNOSIS — Z1389 Encounter for screening for other disorder: Secondary | ICD-10-CM | POA: Diagnosis not present

## 2016-07-26 DIAGNOSIS — G834 Cauda equina syndrome: Secondary | ICD-10-CM | POA: Diagnosis not present

## 2016-07-26 DIAGNOSIS — Z436 Encounter for attention to other artificial openings of urinary tract: Secondary | ICD-10-CM | POA: Diagnosis not present

## 2016-07-26 DIAGNOSIS — G8250 Quadriplegia, unspecified: Secondary | ICD-10-CM | POA: Diagnosis not present

## 2016-07-29 DIAGNOSIS — G834 Cauda equina syndrome: Secondary | ICD-10-CM | POA: Diagnosis not present

## 2016-07-29 DIAGNOSIS — G8250 Quadriplegia, unspecified: Secondary | ICD-10-CM | POA: Diagnosis not present

## 2016-07-29 DIAGNOSIS — Z436 Encounter for attention to other artificial openings of urinary tract: Secondary | ICD-10-CM | POA: Diagnosis not present

## 2016-08-02 DIAGNOSIS — G8250 Quadriplegia, unspecified: Secondary | ICD-10-CM | POA: Diagnosis not present

## 2016-08-02 DIAGNOSIS — G834 Cauda equina syndrome: Secondary | ICD-10-CM | POA: Diagnosis not present

## 2016-08-02 DIAGNOSIS — Z436 Encounter for attention to other artificial openings of urinary tract: Secondary | ICD-10-CM | POA: Diagnosis not present

## 2016-08-05 DIAGNOSIS — G8250 Quadriplegia, unspecified: Secondary | ICD-10-CM | POA: Diagnosis not present

## 2016-08-05 DIAGNOSIS — Z436 Encounter for attention to other artificial openings of urinary tract: Secondary | ICD-10-CM | POA: Diagnosis not present

## 2016-08-05 DIAGNOSIS — G834 Cauda equina syndrome: Secondary | ICD-10-CM | POA: Diagnosis not present

## 2016-08-06 DIAGNOSIS — Z466 Encounter for fitting and adjustment of urinary device: Secondary | ICD-10-CM | POA: Diagnosis not present

## 2016-08-06 DIAGNOSIS — G8250 Quadriplegia, unspecified: Secondary | ICD-10-CM | POA: Diagnosis not present

## 2016-08-06 DIAGNOSIS — G834 Cauda equina syndrome: Secondary | ICD-10-CM | POA: Diagnosis not present

## 2016-08-06 DIAGNOSIS — Z7982 Long term (current) use of aspirin: Secondary | ICD-10-CM | POA: Diagnosis not present

## 2016-08-06 DIAGNOSIS — Z791 Long term (current) use of non-steroidal anti-inflammatories (NSAID): Secondary | ICD-10-CM | POA: Diagnosis not present

## 2016-08-09 DIAGNOSIS — G8250 Quadriplegia, unspecified: Secondary | ICD-10-CM | POA: Diagnosis not present

## 2016-08-09 DIAGNOSIS — Z7982 Long term (current) use of aspirin: Secondary | ICD-10-CM | POA: Diagnosis not present

## 2016-08-09 DIAGNOSIS — G834 Cauda equina syndrome: Secondary | ICD-10-CM | POA: Diagnosis not present

## 2016-08-09 DIAGNOSIS — Z466 Encounter for fitting and adjustment of urinary device: Secondary | ICD-10-CM | POA: Diagnosis not present

## 2016-08-09 DIAGNOSIS — Z791 Long term (current) use of non-steroidal anti-inflammatories (NSAID): Secondary | ICD-10-CM | POA: Diagnosis not present

## 2016-08-10 DIAGNOSIS — Z466 Encounter for fitting and adjustment of urinary device: Secondary | ICD-10-CM | POA: Diagnosis not present

## 2016-08-10 DIAGNOSIS — G8250 Quadriplegia, unspecified: Secondary | ICD-10-CM | POA: Diagnosis not present

## 2016-08-10 DIAGNOSIS — G834 Cauda equina syndrome: Secondary | ICD-10-CM | POA: Diagnosis not present

## 2016-08-10 DIAGNOSIS — Z7982 Long term (current) use of aspirin: Secondary | ICD-10-CM | POA: Diagnosis not present

## 2016-08-10 DIAGNOSIS — Z791 Long term (current) use of non-steroidal anti-inflammatories (NSAID): Secondary | ICD-10-CM | POA: Diagnosis not present

## 2016-08-12 DIAGNOSIS — Z7982 Long term (current) use of aspirin: Secondary | ICD-10-CM | POA: Diagnosis not present

## 2016-08-12 DIAGNOSIS — G8250 Quadriplegia, unspecified: Secondary | ICD-10-CM | POA: Diagnosis not present

## 2016-08-12 DIAGNOSIS — G834 Cauda equina syndrome: Secondary | ICD-10-CM | POA: Diagnosis not present

## 2016-08-12 DIAGNOSIS — Z466 Encounter for fitting and adjustment of urinary device: Secondary | ICD-10-CM | POA: Diagnosis not present

## 2016-08-12 DIAGNOSIS — Z791 Long term (current) use of non-steroidal anti-inflammatories (NSAID): Secondary | ICD-10-CM | POA: Diagnosis not present

## 2016-08-16 DIAGNOSIS — Z791 Long term (current) use of non-steroidal anti-inflammatories (NSAID): Secondary | ICD-10-CM | POA: Diagnosis not present

## 2016-08-16 DIAGNOSIS — G834 Cauda equina syndrome: Secondary | ICD-10-CM | POA: Diagnosis not present

## 2016-08-16 DIAGNOSIS — G8250 Quadriplegia, unspecified: Secondary | ICD-10-CM | POA: Diagnosis not present

## 2016-08-16 DIAGNOSIS — Z466 Encounter for fitting and adjustment of urinary device: Secondary | ICD-10-CM | POA: Diagnosis not present

## 2016-08-16 DIAGNOSIS — Z7982 Long term (current) use of aspirin: Secondary | ICD-10-CM | POA: Diagnosis not present

## 2016-08-19 DIAGNOSIS — G8250 Quadriplegia, unspecified: Secondary | ICD-10-CM | POA: Diagnosis not present

## 2016-08-19 DIAGNOSIS — Z7982 Long term (current) use of aspirin: Secondary | ICD-10-CM | POA: Diagnosis not present

## 2016-08-19 DIAGNOSIS — Z791 Long term (current) use of non-steroidal anti-inflammatories (NSAID): Secondary | ICD-10-CM | POA: Diagnosis not present

## 2016-08-19 DIAGNOSIS — G834 Cauda equina syndrome: Secondary | ICD-10-CM | POA: Diagnosis not present

## 2016-08-19 DIAGNOSIS — Z466 Encounter for fitting and adjustment of urinary device: Secondary | ICD-10-CM | POA: Diagnosis not present

## 2016-08-23 DIAGNOSIS — G8250 Quadriplegia, unspecified: Secondary | ICD-10-CM | POA: Diagnosis not present

## 2016-08-23 DIAGNOSIS — Z7982 Long term (current) use of aspirin: Secondary | ICD-10-CM | POA: Diagnosis not present

## 2016-08-23 DIAGNOSIS — Z466 Encounter for fitting and adjustment of urinary device: Secondary | ICD-10-CM | POA: Diagnosis not present

## 2016-08-23 DIAGNOSIS — G834 Cauda equina syndrome: Secondary | ICD-10-CM | POA: Diagnosis not present

## 2016-08-23 DIAGNOSIS — Z791 Long term (current) use of non-steroidal anti-inflammatories (NSAID): Secondary | ICD-10-CM | POA: Diagnosis not present

## 2016-08-26 DIAGNOSIS — Z791 Long term (current) use of non-steroidal anti-inflammatories (NSAID): Secondary | ICD-10-CM | POA: Diagnosis not present

## 2016-08-26 DIAGNOSIS — Z466 Encounter for fitting and adjustment of urinary device: Secondary | ICD-10-CM | POA: Diagnosis not present

## 2016-08-26 DIAGNOSIS — G834 Cauda equina syndrome: Secondary | ICD-10-CM | POA: Diagnosis not present

## 2016-08-26 DIAGNOSIS — Z7982 Long term (current) use of aspirin: Secondary | ICD-10-CM | POA: Diagnosis not present

## 2016-08-26 DIAGNOSIS — G8250 Quadriplegia, unspecified: Secondary | ICD-10-CM | POA: Diagnosis not present

## 2016-08-30 DIAGNOSIS — Z466 Encounter for fitting and adjustment of urinary device: Secondary | ICD-10-CM | POA: Diagnosis not present

## 2016-08-30 DIAGNOSIS — Z7982 Long term (current) use of aspirin: Secondary | ICD-10-CM | POA: Diagnosis not present

## 2016-08-30 DIAGNOSIS — G8250 Quadriplegia, unspecified: Secondary | ICD-10-CM | POA: Diagnosis not present

## 2016-08-30 DIAGNOSIS — Z791 Long term (current) use of non-steroidal anti-inflammatories (NSAID): Secondary | ICD-10-CM | POA: Diagnosis not present

## 2016-08-30 DIAGNOSIS — G834 Cauda equina syndrome: Secondary | ICD-10-CM | POA: Diagnosis not present

## 2016-08-31 DIAGNOSIS — G834 Cauda equina syndrome: Secondary | ICD-10-CM | POA: Diagnosis not present

## 2016-08-31 DIAGNOSIS — Z791 Long term (current) use of non-steroidal anti-inflammatories (NSAID): Secondary | ICD-10-CM | POA: Diagnosis not present

## 2016-08-31 DIAGNOSIS — G8250 Quadriplegia, unspecified: Secondary | ICD-10-CM | POA: Diagnosis not present

## 2016-08-31 DIAGNOSIS — Z7982 Long term (current) use of aspirin: Secondary | ICD-10-CM | POA: Diagnosis not present

## 2016-08-31 DIAGNOSIS — Z466 Encounter for fitting and adjustment of urinary device: Secondary | ICD-10-CM | POA: Diagnosis not present

## 2016-09-02 DIAGNOSIS — G834 Cauda equina syndrome: Secondary | ICD-10-CM | POA: Diagnosis not present

## 2016-09-02 DIAGNOSIS — G8250 Quadriplegia, unspecified: Secondary | ICD-10-CM | POA: Diagnosis not present

## 2016-09-02 DIAGNOSIS — Z7982 Long term (current) use of aspirin: Secondary | ICD-10-CM | POA: Diagnosis not present

## 2016-09-02 DIAGNOSIS — Z466 Encounter for fitting and adjustment of urinary device: Secondary | ICD-10-CM | POA: Diagnosis not present

## 2016-09-02 DIAGNOSIS — Z791 Long term (current) use of non-steroidal anti-inflammatories (NSAID): Secondary | ICD-10-CM | POA: Diagnosis not present

## 2016-09-06 DIAGNOSIS — Z7982 Long term (current) use of aspirin: Secondary | ICD-10-CM | POA: Diagnosis not present

## 2016-09-06 DIAGNOSIS — Z466 Encounter for fitting and adjustment of urinary device: Secondary | ICD-10-CM | POA: Diagnosis not present

## 2016-09-06 DIAGNOSIS — G8250 Quadriplegia, unspecified: Secondary | ICD-10-CM | POA: Diagnosis not present

## 2016-09-06 DIAGNOSIS — Z791 Long term (current) use of non-steroidal anti-inflammatories (NSAID): Secondary | ICD-10-CM | POA: Diagnosis not present

## 2016-09-06 DIAGNOSIS — G834 Cauda equina syndrome: Secondary | ICD-10-CM | POA: Diagnosis not present

## 2016-09-09 DIAGNOSIS — G834 Cauda equina syndrome: Secondary | ICD-10-CM | POA: Diagnosis not present

## 2016-09-09 DIAGNOSIS — Z466 Encounter for fitting and adjustment of urinary device: Secondary | ICD-10-CM | POA: Diagnosis not present

## 2016-09-09 DIAGNOSIS — Z791 Long term (current) use of non-steroidal anti-inflammatories (NSAID): Secondary | ICD-10-CM | POA: Diagnosis not present

## 2016-09-09 DIAGNOSIS — G8250 Quadriplegia, unspecified: Secondary | ICD-10-CM | POA: Diagnosis not present

## 2016-09-09 DIAGNOSIS — Z7982 Long term (current) use of aspirin: Secondary | ICD-10-CM | POA: Diagnosis not present

## 2016-09-13 DIAGNOSIS — G8250 Quadriplegia, unspecified: Secondary | ICD-10-CM | POA: Diagnosis not present

## 2016-09-13 DIAGNOSIS — Z466 Encounter for fitting and adjustment of urinary device: Secondary | ICD-10-CM | POA: Diagnosis not present

## 2016-09-13 DIAGNOSIS — Z7982 Long term (current) use of aspirin: Secondary | ICD-10-CM | POA: Diagnosis not present

## 2016-09-13 DIAGNOSIS — G834 Cauda equina syndrome: Secondary | ICD-10-CM | POA: Diagnosis not present

## 2016-09-13 DIAGNOSIS — Z791 Long term (current) use of non-steroidal anti-inflammatories (NSAID): Secondary | ICD-10-CM | POA: Diagnosis not present

## 2016-09-14 DIAGNOSIS — Z466 Encounter for fitting and adjustment of urinary device: Secondary | ICD-10-CM | POA: Diagnosis not present

## 2016-09-14 DIAGNOSIS — Z791 Long term (current) use of non-steroidal anti-inflammatories (NSAID): Secondary | ICD-10-CM | POA: Diagnosis not present

## 2016-09-14 DIAGNOSIS — G8250 Quadriplegia, unspecified: Secondary | ICD-10-CM | POA: Diagnosis not present

## 2016-09-14 DIAGNOSIS — G834 Cauda equina syndrome: Secondary | ICD-10-CM | POA: Diagnosis not present

## 2016-09-14 DIAGNOSIS — Z7982 Long term (current) use of aspirin: Secondary | ICD-10-CM | POA: Diagnosis not present

## 2016-09-16 DIAGNOSIS — G8250 Quadriplegia, unspecified: Secondary | ICD-10-CM | POA: Diagnosis not present

## 2016-09-16 DIAGNOSIS — Z7982 Long term (current) use of aspirin: Secondary | ICD-10-CM | POA: Diagnosis not present

## 2016-09-16 DIAGNOSIS — Z791 Long term (current) use of non-steroidal anti-inflammatories (NSAID): Secondary | ICD-10-CM | POA: Diagnosis not present

## 2016-09-16 DIAGNOSIS — Z466 Encounter for fitting and adjustment of urinary device: Secondary | ICD-10-CM | POA: Diagnosis not present

## 2016-09-16 DIAGNOSIS — G834 Cauda equina syndrome: Secondary | ICD-10-CM | POA: Diagnosis not present

## 2016-09-20 DIAGNOSIS — Z791 Long term (current) use of non-steroidal anti-inflammatories (NSAID): Secondary | ICD-10-CM | POA: Diagnosis not present

## 2016-09-20 DIAGNOSIS — Z7982 Long term (current) use of aspirin: Secondary | ICD-10-CM | POA: Diagnosis not present

## 2016-09-20 DIAGNOSIS — Z466 Encounter for fitting and adjustment of urinary device: Secondary | ICD-10-CM | POA: Diagnosis not present

## 2016-09-20 DIAGNOSIS — G8250 Quadriplegia, unspecified: Secondary | ICD-10-CM | POA: Diagnosis not present

## 2016-09-20 DIAGNOSIS — G834 Cauda equina syndrome: Secondary | ICD-10-CM | POA: Diagnosis not present

## 2016-09-21 DIAGNOSIS — Z791 Long term (current) use of non-steroidal anti-inflammatories (NSAID): Secondary | ICD-10-CM | POA: Diagnosis not present

## 2016-09-21 DIAGNOSIS — G8250 Quadriplegia, unspecified: Secondary | ICD-10-CM | POA: Diagnosis not present

## 2016-09-21 DIAGNOSIS — G834 Cauda equina syndrome: Secondary | ICD-10-CM | POA: Diagnosis not present

## 2016-09-21 DIAGNOSIS — Z7982 Long term (current) use of aspirin: Secondary | ICD-10-CM | POA: Diagnosis not present

## 2016-09-21 DIAGNOSIS — Z466 Encounter for fitting and adjustment of urinary device: Secondary | ICD-10-CM | POA: Diagnosis not present

## 2016-09-23 DIAGNOSIS — Z791 Long term (current) use of non-steroidal anti-inflammatories (NSAID): Secondary | ICD-10-CM | POA: Diagnosis not present

## 2016-09-23 DIAGNOSIS — G8250 Quadriplegia, unspecified: Secondary | ICD-10-CM | POA: Diagnosis not present

## 2016-09-23 DIAGNOSIS — Z7982 Long term (current) use of aspirin: Secondary | ICD-10-CM | POA: Diagnosis not present

## 2016-09-23 DIAGNOSIS — G834 Cauda equina syndrome: Secondary | ICD-10-CM | POA: Diagnosis not present

## 2016-09-23 DIAGNOSIS — Z466 Encounter for fitting and adjustment of urinary device: Secondary | ICD-10-CM | POA: Diagnosis not present

## 2016-09-28 DIAGNOSIS — G8250 Quadriplegia, unspecified: Secondary | ICD-10-CM | POA: Diagnosis not present

## 2016-09-28 DIAGNOSIS — Z7982 Long term (current) use of aspirin: Secondary | ICD-10-CM | POA: Diagnosis not present

## 2016-09-28 DIAGNOSIS — Z466 Encounter for fitting and adjustment of urinary device: Secondary | ICD-10-CM | POA: Diagnosis not present

## 2016-09-28 DIAGNOSIS — G834 Cauda equina syndrome: Secondary | ICD-10-CM | POA: Diagnosis not present

## 2016-09-28 DIAGNOSIS — Z791 Long term (current) use of non-steroidal anti-inflammatories (NSAID): Secondary | ICD-10-CM | POA: Diagnosis not present

## 2016-09-29 DIAGNOSIS — G834 Cauda equina syndrome: Secondary | ICD-10-CM | POA: Diagnosis not present

## 2016-09-29 DIAGNOSIS — Z466 Encounter for fitting and adjustment of urinary device: Secondary | ICD-10-CM | POA: Diagnosis not present

## 2016-09-29 DIAGNOSIS — Z7982 Long term (current) use of aspirin: Secondary | ICD-10-CM | POA: Diagnosis not present

## 2016-09-29 DIAGNOSIS — G8250 Quadriplegia, unspecified: Secondary | ICD-10-CM | POA: Diagnosis not present

## 2016-09-29 DIAGNOSIS — Z791 Long term (current) use of non-steroidal anti-inflammatories (NSAID): Secondary | ICD-10-CM | POA: Diagnosis not present

## 2016-09-30 DIAGNOSIS — Z791 Long term (current) use of non-steroidal anti-inflammatories (NSAID): Secondary | ICD-10-CM | POA: Diagnosis not present

## 2016-09-30 DIAGNOSIS — G834 Cauda equina syndrome: Secondary | ICD-10-CM | POA: Diagnosis not present

## 2016-09-30 DIAGNOSIS — Z7982 Long term (current) use of aspirin: Secondary | ICD-10-CM | POA: Diagnosis not present

## 2016-09-30 DIAGNOSIS — G8250 Quadriplegia, unspecified: Secondary | ICD-10-CM | POA: Diagnosis not present

## 2016-09-30 DIAGNOSIS — Z466 Encounter for fitting and adjustment of urinary device: Secondary | ICD-10-CM | POA: Diagnosis not present

## 2016-10-07 DIAGNOSIS — G8250 Quadriplegia, unspecified: Secondary | ICD-10-CM | POA: Diagnosis not present

## 2016-10-07 DIAGNOSIS — Z466 Encounter for fitting and adjustment of urinary device: Secondary | ICD-10-CM | POA: Diagnosis not present

## 2016-10-07 DIAGNOSIS — G834 Cauda equina syndrome: Secondary | ICD-10-CM | POA: Diagnosis not present

## 2016-10-07 DIAGNOSIS — Z7982 Long term (current) use of aspirin: Secondary | ICD-10-CM | POA: Diagnosis not present

## 2016-10-07 DIAGNOSIS — Z791 Long term (current) use of non-steroidal anti-inflammatories (NSAID): Secondary | ICD-10-CM | POA: Diagnosis not present

## 2016-10-11 DIAGNOSIS — G8250 Quadriplegia, unspecified: Secondary | ICD-10-CM | POA: Diagnosis not present

## 2016-10-11 DIAGNOSIS — Z466 Encounter for fitting and adjustment of urinary device: Secondary | ICD-10-CM | POA: Diagnosis not present

## 2016-10-11 DIAGNOSIS — G834 Cauda equina syndrome: Secondary | ICD-10-CM | POA: Diagnosis not present

## 2016-10-11 DIAGNOSIS — Z7982 Long term (current) use of aspirin: Secondary | ICD-10-CM | POA: Diagnosis not present

## 2016-10-11 DIAGNOSIS — Z791 Long term (current) use of non-steroidal anti-inflammatories (NSAID): Secondary | ICD-10-CM | POA: Diagnosis not present

## 2016-10-14 DIAGNOSIS — Z791 Long term (current) use of non-steroidal anti-inflammatories (NSAID): Secondary | ICD-10-CM | POA: Diagnosis not present

## 2016-10-14 DIAGNOSIS — G8250 Quadriplegia, unspecified: Secondary | ICD-10-CM | POA: Diagnosis not present

## 2016-10-14 DIAGNOSIS — G834 Cauda equina syndrome: Secondary | ICD-10-CM | POA: Diagnosis not present

## 2016-10-14 DIAGNOSIS — Z466 Encounter for fitting and adjustment of urinary device: Secondary | ICD-10-CM | POA: Diagnosis not present

## 2016-10-14 DIAGNOSIS — Z7982 Long term (current) use of aspirin: Secondary | ICD-10-CM | POA: Diagnosis not present

## 2016-10-18 DIAGNOSIS — Z466 Encounter for fitting and adjustment of urinary device: Secondary | ICD-10-CM | POA: Diagnosis not present

## 2016-10-18 DIAGNOSIS — G8250 Quadriplegia, unspecified: Secondary | ICD-10-CM | POA: Diagnosis not present

## 2016-10-18 DIAGNOSIS — Z7982 Long term (current) use of aspirin: Secondary | ICD-10-CM | POA: Diagnosis not present

## 2016-10-18 DIAGNOSIS — G834 Cauda equina syndrome: Secondary | ICD-10-CM | POA: Diagnosis not present

## 2016-10-18 DIAGNOSIS — Z791 Long term (current) use of non-steroidal anti-inflammatories (NSAID): Secondary | ICD-10-CM | POA: Diagnosis not present

## 2016-10-19 DIAGNOSIS — Z791 Long term (current) use of non-steroidal anti-inflammatories (NSAID): Secondary | ICD-10-CM | POA: Diagnosis not present

## 2016-10-19 DIAGNOSIS — Z466 Encounter for fitting and adjustment of urinary device: Secondary | ICD-10-CM | POA: Diagnosis not present

## 2016-10-19 DIAGNOSIS — G8250 Quadriplegia, unspecified: Secondary | ICD-10-CM | POA: Diagnosis not present

## 2016-10-19 DIAGNOSIS — G834 Cauda equina syndrome: Secondary | ICD-10-CM | POA: Diagnosis not present

## 2016-10-19 DIAGNOSIS — Z7982 Long term (current) use of aspirin: Secondary | ICD-10-CM | POA: Diagnosis not present

## 2016-10-20 DIAGNOSIS — G8250 Quadriplegia, unspecified: Secondary | ICD-10-CM | POA: Diagnosis not present

## 2016-10-20 DIAGNOSIS — Z466 Encounter for fitting and adjustment of urinary device: Secondary | ICD-10-CM | POA: Diagnosis not present

## 2016-10-20 DIAGNOSIS — Z791 Long term (current) use of non-steroidal anti-inflammatories (NSAID): Secondary | ICD-10-CM | POA: Diagnosis not present

## 2016-10-20 DIAGNOSIS — Z7982 Long term (current) use of aspirin: Secondary | ICD-10-CM | POA: Diagnosis not present

## 2016-10-20 DIAGNOSIS — G834 Cauda equina syndrome: Secondary | ICD-10-CM | POA: Diagnosis not present

## 2016-10-21 DIAGNOSIS — G8250 Quadriplegia, unspecified: Secondary | ICD-10-CM | POA: Diagnosis not present

## 2016-10-21 DIAGNOSIS — Z466 Encounter for fitting and adjustment of urinary device: Secondary | ICD-10-CM | POA: Diagnosis not present

## 2016-10-21 DIAGNOSIS — G834 Cauda equina syndrome: Secondary | ICD-10-CM | POA: Diagnosis not present

## 2016-10-21 DIAGNOSIS — Z791 Long term (current) use of non-steroidal anti-inflammatories (NSAID): Secondary | ICD-10-CM | POA: Diagnosis not present

## 2016-10-21 DIAGNOSIS — Z7982 Long term (current) use of aspirin: Secondary | ICD-10-CM | POA: Diagnosis not present

## 2016-10-25 DIAGNOSIS — Z791 Long term (current) use of non-steroidal anti-inflammatories (NSAID): Secondary | ICD-10-CM | POA: Diagnosis not present

## 2016-10-25 DIAGNOSIS — Z466 Encounter for fitting and adjustment of urinary device: Secondary | ICD-10-CM | POA: Diagnosis not present

## 2016-10-25 DIAGNOSIS — G8250 Quadriplegia, unspecified: Secondary | ICD-10-CM | POA: Diagnosis not present

## 2016-10-25 DIAGNOSIS — Z7982 Long term (current) use of aspirin: Secondary | ICD-10-CM | POA: Diagnosis not present

## 2016-10-25 DIAGNOSIS — G834 Cauda equina syndrome: Secondary | ICD-10-CM | POA: Diagnosis not present

## 2016-10-27 DIAGNOSIS — Z7982 Long term (current) use of aspirin: Secondary | ICD-10-CM | POA: Diagnosis not present

## 2016-10-27 DIAGNOSIS — G8250 Quadriplegia, unspecified: Secondary | ICD-10-CM | POA: Diagnosis not present

## 2016-10-27 DIAGNOSIS — Z791 Long term (current) use of non-steroidal anti-inflammatories (NSAID): Secondary | ICD-10-CM | POA: Diagnosis not present

## 2016-10-27 DIAGNOSIS — G834 Cauda equina syndrome: Secondary | ICD-10-CM | POA: Diagnosis not present

## 2016-10-27 DIAGNOSIS — Z466 Encounter for fitting and adjustment of urinary device: Secondary | ICD-10-CM | POA: Diagnosis not present

## 2016-10-28 DIAGNOSIS — Z7982 Long term (current) use of aspirin: Secondary | ICD-10-CM | POA: Diagnosis not present

## 2016-10-28 DIAGNOSIS — G8250 Quadriplegia, unspecified: Secondary | ICD-10-CM | POA: Diagnosis not present

## 2016-10-28 DIAGNOSIS — Z791 Long term (current) use of non-steroidal anti-inflammatories (NSAID): Secondary | ICD-10-CM | POA: Diagnosis not present

## 2016-10-28 DIAGNOSIS — Z466 Encounter for fitting and adjustment of urinary device: Secondary | ICD-10-CM | POA: Diagnosis not present

## 2016-10-28 DIAGNOSIS — G834 Cauda equina syndrome: Secondary | ICD-10-CM | POA: Diagnosis not present

## 2016-11-02 DIAGNOSIS — Z466 Encounter for fitting and adjustment of urinary device: Secondary | ICD-10-CM | POA: Diagnosis not present

## 2016-11-02 DIAGNOSIS — G834 Cauda equina syndrome: Secondary | ICD-10-CM | POA: Diagnosis not present

## 2016-11-02 DIAGNOSIS — Z7982 Long term (current) use of aspirin: Secondary | ICD-10-CM | POA: Diagnosis not present

## 2016-11-02 DIAGNOSIS — Z791 Long term (current) use of non-steroidal anti-inflammatories (NSAID): Secondary | ICD-10-CM | POA: Diagnosis not present

## 2016-11-02 DIAGNOSIS — G8250 Quadriplegia, unspecified: Secondary | ICD-10-CM | POA: Diagnosis not present

## 2016-11-04 DIAGNOSIS — Z791 Long term (current) use of non-steroidal anti-inflammatories (NSAID): Secondary | ICD-10-CM | POA: Diagnosis not present

## 2016-11-04 DIAGNOSIS — Z7982 Long term (current) use of aspirin: Secondary | ICD-10-CM | POA: Diagnosis not present

## 2016-11-04 DIAGNOSIS — G8250 Quadriplegia, unspecified: Secondary | ICD-10-CM | POA: Diagnosis not present

## 2016-11-04 DIAGNOSIS — Z466 Encounter for fitting and adjustment of urinary device: Secondary | ICD-10-CM | POA: Diagnosis not present

## 2016-11-04 DIAGNOSIS — G834 Cauda equina syndrome: Secondary | ICD-10-CM | POA: Diagnosis not present

## 2016-11-08 DIAGNOSIS — Z466 Encounter for fitting and adjustment of urinary device: Secondary | ICD-10-CM | POA: Diagnosis not present

## 2016-11-08 DIAGNOSIS — Z791 Long term (current) use of non-steroidal anti-inflammatories (NSAID): Secondary | ICD-10-CM | POA: Diagnosis not present

## 2016-11-08 DIAGNOSIS — G8250 Quadriplegia, unspecified: Secondary | ICD-10-CM | POA: Diagnosis not present

## 2016-11-08 DIAGNOSIS — Z7982 Long term (current) use of aspirin: Secondary | ICD-10-CM | POA: Diagnosis not present

## 2016-11-08 DIAGNOSIS — G834 Cauda equina syndrome: Secondary | ICD-10-CM | POA: Diagnosis not present

## 2016-11-11 DIAGNOSIS — Z791 Long term (current) use of non-steroidal anti-inflammatories (NSAID): Secondary | ICD-10-CM | POA: Diagnosis not present

## 2016-11-11 DIAGNOSIS — Z466 Encounter for fitting and adjustment of urinary device: Secondary | ICD-10-CM | POA: Diagnosis not present

## 2016-11-11 DIAGNOSIS — G834 Cauda equina syndrome: Secondary | ICD-10-CM | POA: Diagnosis not present

## 2016-11-11 DIAGNOSIS — G8250 Quadriplegia, unspecified: Secondary | ICD-10-CM | POA: Diagnosis not present

## 2016-11-11 DIAGNOSIS — Z7982 Long term (current) use of aspirin: Secondary | ICD-10-CM | POA: Diagnosis not present

## 2016-11-12 DIAGNOSIS — Z882 Allergy status to sulfonamides status: Secondary | ICD-10-CM | POA: Diagnosis not present

## 2016-11-12 DIAGNOSIS — R3911 Hesitancy of micturition: Secondary | ICD-10-CM | POA: Diagnosis not present

## 2016-11-12 DIAGNOSIS — N3010 Interstitial cystitis (chronic) without hematuria: Secondary | ICD-10-CM | POA: Diagnosis not present

## 2016-11-12 DIAGNOSIS — R5381 Other malaise: Secondary | ICD-10-CM | POA: Diagnosis not present

## 2016-11-12 DIAGNOSIS — Z23 Encounter for immunization: Secondary | ICD-10-CM | POA: Diagnosis not present

## 2016-11-15 DIAGNOSIS — Z7982 Long term (current) use of aspirin: Secondary | ICD-10-CM | POA: Diagnosis not present

## 2016-11-15 DIAGNOSIS — Z466 Encounter for fitting and adjustment of urinary device: Secondary | ICD-10-CM | POA: Diagnosis not present

## 2016-11-15 DIAGNOSIS — G834 Cauda equina syndrome: Secondary | ICD-10-CM | POA: Diagnosis not present

## 2016-11-15 DIAGNOSIS — Z791 Long term (current) use of non-steroidal anti-inflammatories (NSAID): Secondary | ICD-10-CM | POA: Diagnosis not present

## 2016-11-15 DIAGNOSIS — G8250 Quadriplegia, unspecified: Secondary | ICD-10-CM | POA: Diagnosis not present

## 2016-11-16 DIAGNOSIS — Z466 Encounter for fitting and adjustment of urinary device: Secondary | ICD-10-CM | POA: Diagnosis not present

## 2016-11-16 DIAGNOSIS — Z7982 Long term (current) use of aspirin: Secondary | ICD-10-CM | POA: Diagnosis not present

## 2016-11-16 DIAGNOSIS — G8250 Quadriplegia, unspecified: Secondary | ICD-10-CM | POA: Diagnosis not present

## 2016-11-16 DIAGNOSIS — Z791 Long term (current) use of non-steroidal anti-inflammatories (NSAID): Secondary | ICD-10-CM | POA: Diagnosis not present

## 2016-11-16 DIAGNOSIS — G834 Cauda equina syndrome: Secondary | ICD-10-CM | POA: Diagnosis not present

## 2016-11-18 DIAGNOSIS — Z466 Encounter for fitting and adjustment of urinary device: Secondary | ICD-10-CM | POA: Diagnosis not present

## 2016-11-18 DIAGNOSIS — G834 Cauda equina syndrome: Secondary | ICD-10-CM | POA: Diagnosis not present

## 2016-11-18 DIAGNOSIS — G8250 Quadriplegia, unspecified: Secondary | ICD-10-CM | POA: Diagnosis not present

## 2016-11-18 DIAGNOSIS — Z791 Long term (current) use of non-steroidal anti-inflammatories (NSAID): Secondary | ICD-10-CM | POA: Diagnosis not present

## 2016-11-18 DIAGNOSIS — Z7982 Long term (current) use of aspirin: Secondary | ICD-10-CM | POA: Diagnosis not present

## 2016-11-29 DIAGNOSIS — G8250 Quadriplegia, unspecified: Secondary | ICD-10-CM | POA: Diagnosis not present

## 2016-11-29 DIAGNOSIS — G834 Cauda equina syndrome: Secondary | ICD-10-CM | POA: Diagnosis not present

## 2016-11-29 DIAGNOSIS — Z7982 Long term (current) use of aspirin: Secondary | ICD-10-CM | POA: Diagnosis not present

## 2016-11-29 DIAGNOSIS — Z791 Long term (current) use of non-steroidal anti-inflammatories (NSAID): Secondary | ICD-10-CM | POA: Diagnosis not present

## 2016-11-29 DIAGNOSIS — Z466 Encounter for fitting and adjustment of urinary device: Secondary | ICD-10-CM | POA: Diagnosis not present

## 2016-12-04 DIAGNOSIS — Z7982 Long term (current) use of aspirin: Secondary | ICD-10-CM | POA: Diagnosis not present

## 2016-12-04 DIAGNOSIS — G8250 Quadriplegia, unspecified: Secondary | ICD-10-CM | POA: Diagnosis not present

## 2016-12-04 DIAGNOSIS — Z466 Encounter for fitting and adjustment of urinary device: Secondary | ICD-10-CM | POA: Diagnosis not present

## 2016-12-04 DIAGNOSIS — G834 Cauda equina syndrome: Secondary | ICD-10-CM | POA: Diagnosis not present

## 2016-12-04 DIAGNOSIS — Z791 Long term (current) use of non-steroidal anti-inflammatories (NSAID): Secondary | ICD-10-CM | POA: Diagnosis not present

## 2016-12-06 DIAGNOSIS — Z791 Long term (current) use of non-steroidal anti-inflammatories (NSAID): Secondary | ICD-10-CM | POA: Diagnosis not present

## 2016-12-06 DIAGNOSIS — Z7982 Long term (current) use of aspirin: Secondary | ICD-10-CM | POA: Diagnosis not present

## 2016-12-06 DIAGNOSIS — G834 Cauda equina syndrome: Secondary | ICD-10-CM | POA: Diagnosis not present

## 2016-12-06 DIAGNOSIS — G8250 Quadriplegia, unspecified: Secondary | ICD-10-CM | POA: Diagnosis not present

## 2016-12-06 DIAGNOSIS — Z466 Encounter for fitting and adjustment of urinary device: Secondary | ICD-10-CM | POA: Diagnosis not present

## 2016-12-07 DIAGNOSIS — Z7982 Long term (current) use of aspirin: Secondary | ICD-10-CM | POA: Diagnosis not present

## 2016-12-07 DIAGNOSIS — G8250 Quadriplegia, unspecified: Secondary | ICD-10-CM | POA: Diagnosis not present

## 2016-12-07 DIAGNOSIS — Z791 Long term (current) use of non-steroidal anti-inflammatories (NSAID): Secondary | ICD-10-CM | POA: Diagnosis not present

## 2016-12-07 DIAGNOSIS — G834 Cauda equina syndrome: Secondary | ICD-10-CM | POA: Diagnosis not present

## 2016-12-07 DIAGNOSIS — Z466 Encounter for fitting and adjustment of urinary device: Secondary | ICD-10-CM | POA: Diagnosis not present

## 2016-12-09 DIAGNOSIS — Z466 Encounter for fitting and adjustment of urinary device: Secondary | ICD-10-CM | POA: Diagnosis not present

## 2016-12-09 DIAGNOSIS — G8250 Quadriplegia, unspecified: Secondary | ICD-10-CM | POA: Diagnosis not present

## 2016-12-09 DIAGNOSIS — G834 Cauda equina syndrome: Secondary | ICD-10-CM | POA: Diagnosis not present

## 2016-12-09 DIAGNOSIS — Z7982 Long term (current) use of aspirin: Secondary | ICD-10-CM | POA: Diagnosis not present

## 2016-12-09 DIAGNOSIS — Z791 Long term (current) use of non-steroidal anti-inflammatories (NSAID): Secondary | ICD-10-CM | POA: Diagnosis not present

## 2016-12-13 DIAGNOSIS — Z791 Long term (current) use of non-steroidal anti-inflammatories (NSAID): Secondary | ICD-10-CM | POA: Diagnosis not present

## 2016-12-13 DIAGNOSIS — G8250 Quadriplegia, unspecified: Secondary | ICD-10-CM | POA: Diagnosis not present

## 2016-12-13 DIAGNOSIS — G834 Cauda equina syndrome: Secondary | ICD-10-CM | POA: Diagnosis not present

## 2016-12-13 DIAGNOSIS — Z466 Encounter for fitting and adjustment of urinary device: Secondary | ICD-10-CM | POA: Diagnosis not present

## 2016-12-13 DIAGNOSIS — Z7982 Long term (current) use of aspirin: Secondary | ICD-10-CM | POA: Diagnosis not present

## 2016-12-16 DIAGNOSIS — G834 Cauda equina syndrome: Secondary | ICD-10-CM | POA: Diagnosis not present

## 2016-12-16 DIAGNOSIS — Z791 Long term (current) use of non-steroidal anti-inflammatories (NSAID): Secondary | ICD-10-CM | POA: Diagnosis not present

## 2016-12-16 DIAGNOSIS — Z466 Encounter for fitting and adjustment of urinary device: Secondary | ICD-10-CM | POA: Diagnosis not present

## 2016-12-16 DIAGNOSIS — G8250 Quadriplegia, unspecified: Secondary | ICD-10-CM | POA: Diagnosis not present

## 2016-12-16 DIAGNOSIS — Z7982 Long term (current) use of aspirin: Secondary | ICD-10-CM | POA: Diagnosis not present

## 2016-12-20 DIAGNOSIS — Z7982 Long term (current) use of aspirin: Secondary | ICD-10-CM | POA: Diagnosis not present

## 2016-12-20 DIAGNOSIS — Z791 Long term (current) use of non-steroidal anti-inflammatories (NSAID): Secondary | ICD-10-CM | POA: Diagnosis not present

## 2016-12-20 DIAGNOSIS — G8250 Quadriplegia, unspecified: Secondary | ICD-10-CM | POA: Diagnosis not present

## 2016-12-20 DIAGNOSIS — G834 Cauda equina syndrome: Secondary | ICD-10-CM | POA: Diagnosis not present

## 2016-12-20 DIAGNOSIS — Z466 Encounter for fitting and adjustment of urinary device: Secondary | ICD-10-CM | POA: Diagnosis not present

## 2016-12-23 DIAGNOSIS — G8250 Quadriplegia, unspecified: Secondary | ICD-10-CM | POA: Diagnosis not present

## 2016-12-23 DIAGNOSIS — Z7982 Long term (current) use of aspirin: Secondary | ICD-10-CM | POA: Diagnosis not present

## 2016-12-23 DIAGNOSIS — G834 Cauda equina syndrome: Secondary | ICD-10-CM | POA: Diagnosis not present

## 2016-12-23 DIAGNOSIS — Z791 Long term (current) use of non-steroidal anti-inflammatories (NSAID): Secondary | ICD-10-CM | POA: Diagnosis not present

## 2016-12-23 DIAGNOSIS — Z466 Encounter for fitting and adjustment of urinary device: Secondary | ICD-10-CM | POA: Diagnosis not present

## 2016-12-27 DIAGNOSIS — G8250 Quadriplegia, unspecified: Secondary | ICD-10-CM | POA: Diagnosis not present

## 2016-12-27 DIAGNOSIS — Z791 Long term (current) use of non-steroidal anti-inflammatories (NSAID): Secondary | ICD-10-CM | POA: Diagnosis not present

## 2016-12-27 DIAGNOSIS — Z466 Encounter for fitting and adjustment of urinary device: Secondary | ICD-10-CM | POA: Diagnosis not present

## 2016-12-27 DIAGNOSIS — Z7982 Long term (current) use of aspirin: Secondary | ICD-10-CM | POA: Diagnosis not present

## 2016-12-27 DIAGNOSIS — G834 Cauda equina syndrome: Secondary | ICD-10-CM | POA: Diagnosis not present

## 2016-12-28 DIAGNOSIS — Z791 Long term (current) use of non-steroidal anti-inflammatories (NSAID): Secondary | ICD-10-CM | POA: Diagnosis not present

## 2016-12-28 DIAGNOSIS — Z7982 Long term (current) use of aspirin: Secondary | ICD-10-CM | POA: Diagnosis not present

## 2016-12-28 DIAGNOSIS — Z466 Encounter for fitting and adjustment of urinary device: Secondary | ICD-10-CM | POA: Diagnosis not present

## 2016-12-28 DIAGNOSIS — G8250 Quadriplegia, unspecified: Secondary | ICD-10-CM | POA: Diagnosis not present

## 2016-12-28 DIAGNOSIS — G834 Cauda equina syndrome: Secondary | ICD-10-CM | POA: Diagnosis not present

## 2016-12-30 DIAGNOSIS — G8250 Quadriplegia, unspecified: Secondary | ICD-10-CM | POA: Diagnosis not present

## 2016-12-30 DIAGNOSIS — G834 Cauda equina syndrome: Secondary | ICD-10-CM | POA: Diagnosis not present

## 2016-12-30 DIAGNOSIS — Z466 Encounter for fitting and adjustment of urinary device: Secondary | ICD-10-CM | POA: Diagnosis not present

## 2016-12-30 DIAGNOSIS — Z791 Long term (current) use of non-steroidal anti-inflammatories (NSAID): Secondary | ICD-10-CM | POA: Diagnosis not present

## 2016-12-30 DIAGNOSIS — Z7982 Long term (current) use of aspirin: Secondary | ICD-10-CM | POA: Diagnosis not present

## 2017-01-04 DIAGNOSIS — Z791 Long term (current) use of non-steroidal anti-inflammatories (NSAID): Secondary | ICD-10-CM | POA: Diagnosis not present

## 2017-01-04 DIAGNOSIS — G834 Cauda equina syndrome: Secondary | ICD-10-CM | POA: Diagnosis not present

## 2017-01-04 DIAGNOSIS — G8250 Quadriplegia, unspecified: Secondary | ICD-10-CM | POA: Diagnosis not present

## 2017-01-04 DIAGNOSIS — Z7982 Long term (current) use of aspirin: Secondary | ICD-10-CM | POA: Diagnosis not present

## 2017-01-04 DIAGNOSIS — Z466 Encounter for fitting and adjustment of urinary device: Secondary | ICD-10-CM | POA: Diagnosis not present

## 2017-01-06 DIAGNOSIS — Z791 Long term (current) use of non-steroidal anti-inflammatories (NSAID): Secondary | ICD-10-CM | POA: Diagnosis not present

## 2017-01-06 DIAGNOSIS — Z466 Encounter for fitting and adjustment of urinary device: Secondary | ICD-10-CM | POA: Diagnosis not present

## 2017-01-06 DIAGNOSIS — G8250 Quadriplegia, unspecified: Secondary | ICD-10-CM | POA: Diagnosis not present

## 2017-01-06 DIAGNOSIS — Z7982 Long term (current) use of aspirin: Secondary | ICD-10-CM | POA: Diagnosis not present

## 2017-01-06 DIAGNOSIS — G834 Cauda equina syndrome: Secondary | ICD-10-CM | POA: Diagnosis not present

## 2017-01-10 DIAGNOSIS — Z7982 Long term (current) use of aspirin: Secondary | ICD-10-CM | POA: Diagnosis not present

## 2017-01-10 DIAGNOSIS — G834 Cauda equina syndrome: Secondary | ICD-10-CM | POA: Diagnosis not present

## 2017-01-10 DIAGNOSIS — Z466 Encounter for fitting and adjustment of urinary device: Secondary | ICD-10-CM | POA: Diagnosis not present

## 2017-01-10 DIAGNOSIS — Z791 Long term (current) use of non-steroidal anti-inflammatories (NSAID): Secondary | ICD-10-CM | POA: Diagnosis not present

## 2017-01-10 DIAGNOSIS — G8250 Quadriplegia, unspecified: Secondary | ICD-10-CM | POA: Diagnosis not present

## 2017-01-12 DIAGNOSIS — Z466 Encounter for fitting and adjustment of urinary device: Secondary | ICD-10-CM | POA: Diagnosis not present

## 2017-01-12 DIAGNOSIS — Z7982 Long term (current) use of aspirin: Secondary | ICD-10-CM | POA: Diagnosis not present

## 2017-01-12 DIAGNOSIS — G8250 Quadriplegia, unspecified: Secondary | ICD-10-CM | POA: Diagnosis not present

## 2017-01-12 DIAGNOSIS — G834 Cauda equina syndrome: Secondary | ICD-10-CM | POA: Diagnosis not present

## 2017-01-12 DIAGNOSIS — Z791 Long term (current) use of non-steroidal anti-inflammatories (NSAID): Secondary | ICD-10-CM | POA: Diagnosis not present

## 2017-01-13 DIAGNOSIS — G8250 Quadriplegia, unspecified: Secondary | ICD-10-CM | POA: Diagnosis not present

## 2017-01-13 DIAGNOSIS — Z791 Long term (current) use of non-steroidal anti-inflammatories (NSAID): Secondary | ICD-10-CM | POA: Diagnosis not present

## 2017-01-13 DIAGNOSIS — Z7982 Long term (current) use of aspirin: Secondary | ICD-10-CM | POA: Diagnosis not present

## 2017-01-13 DIAGNOSIS — Z466 Encounter for fitting and adjustment of urinary device: Secondary | ICD-10-CM | POA: Diagnosis not present

## 2017-01-13 DIAGNOSIS — G834 Cauda equina syndrome: Secondary | ICD-10-CM | POA: Diagnosis not present

## 2017-01-17 DIAGNOSIS — Z466 Encounter for fitting and adjustment of urinary device: Secondary | ICD-10-CM | POA: Diagnosis not present

## 2017-01-17 DIAGNOSIS — G8250 Quadriplegia, unspecified: Secondary | ICD-10-CM | POA: Diagnosis not present

## 2017-01-17 DIAGNOSIS — G834 Cauda equina syndrome: Secondary | ICD-10-CM | POA: Diagnosis not present

## 2017-01-17 DIAGNOSIS — Z791 Long term (current) use of non-steroidal anti-inflammatories (NSAID): Secondary | ICD-10-CM | POA: Diagnosis not present

## 2017-01-17 DIAGNOSIS — Z7982 Long term (current) use of aspirin: Secondary | ICD-10-CM | POA: Diagnosis not present

## 2017-01-20 DIAGNOSIS — G8250 Quadriplegia, unspecified: Secondary | ICD-10-CM | POA: Diagnosis not present

## 2017-01-20 DIAGNOSIS — Z7982 Long term (current) use of aspirin: Secondary | ICD-10-CM | POA: Diagnosis not present

## 2017-01-20 DIAGNOSIS — Z466 Encounter for fitting and adjustment of urinary device: Secondary | ICD-10-CM | POA: Diagnosis not present

## 2017-01-20 DIAGNOSIS — Z791 Long term (current) use of non-steroidal anti-inflammatories (NSAID): Secondary | ICD-10-CM | POA: Diagnosis not present

## 2017-01-20 DIAGNOSIS — G834 Cauda equina syndrome: Secondary | ICD-10-CM | POA: Diagnosis not present

## 2017-01-24 DIAGNOSIS — G834 Cauda equina syndrome: Secondary | ICD-10-CM | POA: Diagnosis not present

## 2017-01-24 DIAGNOSIS — G8250 Quadriplegia, unspecified: Secondary | ICD-10-CM | POA: Diagnosis not present

## 2017-01-24 DIAGNOSIS — Z791 Long term (current) use of non-steroidal anti-inflammatories (NSAID): Secondary | ICD-10-CM | POA: Diagnosis not present

## 2017-01-24 DIAGNOSIS — Z7982 Long term (current) use of aspirin: Secondary | ICD-10-CM | POA: Diagnosis not present

## 2017-01-24 DIAGNOSIS — Z466 Encounter for fitting and adjustment of urinary device: Secondary | ICD-10-CM | POA: Diagnosis not present

## 2017-01-26 DIAGNOSIS — Z7982 Long term (current) use of aspirin: Secondary | ICD-10-CM | POA: Diagnosis not present

## 2017-01-26 DIAGNOSIS — G834 Cauda equina syndrome: Secondary | ICD-10-CM | POA: Diagnosis not present

## 2017-01-26 DIAGNOSIS — Z791 Long term (current) use of non-steroidal anti-inflammatories (NSAID): Secondary | ICD-10-CM | POA: Diagnosis not present

## 2017-01-26 DIAGNOSIS — G8250 Quadriplegia, unspecified: Secondary | ICD-10-CM | POA: Diagnosis not present

## 2017-01-26 DIAGNOSIS — Z466 Encounter for fitting and adjustment of urinary device: Secondary | ICD-10-CM | POA: Diagnosis not present

## 2017-02-01 DIAGNOSIS — Z7982 Long term (current) use of aspirin: Secondary | ICD-10-CM | POA: Diagnosis not present

## 2017-02-01 DIAGNOSIS — Z791 Long term (current) use of non-steroidal anti-inflammatories (NSAID): Secondary | ICD-10-CM | POA: Diagnosis not present

## 2017-02-01 DIAGNOSIS — Z466 Encounter for fitting and adjustment of urinary device: Secondary | ICD-10-CM | POA: Diagnosis not present

## 2017-02-01 DIAGNOSIS — G834 Cauda equina syndrome: Secondary | ICD-10-CM | POA: Diagnosis not present

## 2017-02-01 DIAGNOSIS — G8250 Quadriplegia, unspecified: Secondary | ICD-10-CM | POA: Diagnosis not present

## 2017-02-02 DIAGNOSIS — G834 Cauda equina syndrome: Secondary | ICD-10-CM | POA: Diagnosis not present

## 2017-02-02 DIAGNOSIS — Z7982 Long term (current) use of aspirin: Secondary | ICD-10-CM | POA: Diagnosis not present

## 2017-02-02 DIAGNOSIS — Z791 Long term (current) use of non-steroidal anti-inflammatories (NSAID): Secondary | ICD-10-CM | POA: Diagnosis not present

## 2017-02-02 DIAGNOSIS — Z466 Encounter for fitting and adjustment of urinary device: Secondary | ICD-10-CM | POA: Diagnosis not present

## 2017-02-02 DIAGNOSIS — G8250 Quadriplegia, unspecified: Secondary | ICD-10-CM | POA: Diagnosis not present

## 2017-02-03 DIAGNOSIS — Z791 Long term (current) use of non-steroidal anti-inflammatories (NSAID): Secondary | ICD-10-CM | POA: Diagnosis not present

## 2017-02-03 DIAGNOSIS — Z466 Encounter for fitting and adjustment of urinary device: Secondary | ICD-10-CM | POA: Diagnosis not present

## 2017-02-03 DIAGNOSIS — G834 Cauda equina syndrome: Secondary | ICD-10-CM | POA: Diagnosis not present

## 2017-02-03 DIAGNOSIS — G8250 Quadriplegia, unspecified: Secondary | ICD-10-CM | POA: Diagnosis not present

## 2017-02-03 DIAGNOSIS — Z7982 Long term (current) use of aspirin: Secondary | ICD-10-CM | POA: Diagnosis not present

## 2017-02-07 DIAGNOSIS — Z466 Encounter for fitting and adjustment of urinary device: Secondary | ICD-10-CM | POA: Diagnosis not present

## 2017-02-07 DIAGNOSIS — G8250 Quadriplegia, unspecified: Secondary | ICD-10-CM | POA: Diagnosis not present

## 2017-02-07 DIAGNOSIS — Z7982 Long term (current) use of aspirin: Secondary | ICD-10-CM | POA: Diagnosis not present

## 2017-02-07 DIAGNOSIS — G834 Cauda equina syndrome: Secondary | ICD-10-CM | POA: Diagnosis not present

## 2017-02-07 DIAGNOSIS — Z791 Long term (current) use of non-steroidal anti-inflammatories (NSAID): Secondary | ICD-10-CM | POA: Diagnosis not present

## 2017-02-10 DIAGNOSIS — G834 Cauda equina syndrome: Secondary | ICD-10-CM | POA: Diagnosis not present

## 2017-02-10 DIAGNOSIS — Z791 Long term (current) use of non-steroidal anti-inflammatories (NSAID): Secondary | ICD-10-CM | POA: Diagnosis not present

## 2017-02-10 DIAGNOSIS — Z7982 Long term (current) use of aspirin: Secondary | ICD-10-CM | POA: Diagnosis not present

## 2017-02-10 DIAGNOSIS — G8250 Quadriplegia, unspecified: Secondary | ICD-10-CM | POA: Diagnosis not present

## 2017-02-10 DIAGNOSIS — Z466 Encounter for fitting and adjustment of urinary device: Secondary | ICD-10-CM | POA: Diagnosis not present

## 2017-02-11 DIAGNOSIS — R6889 Other general symptoms and signs: Secondary | ICD-10-CM | POA: Diagnosis not present

## 2017-02-11 DIAGNOSIS — Z882 Allergy status to sulfonamides status: Secondary | ICD-10-CM | POA: Diagnosis not present

## 2017-02-11 DIAGNOSIS — R5381 Other malaise: Secondary | ICD-10-CM | POA: Diagnosis not present

## 2017-02-11 DIAGNOSIS — S129XXA Fracture of neck, unspecified, initial encounter: Secondary | ICD-10-CM | POA: Diagnosis not present

## 2017-02-11 DIAGNOSIS — G8250 Quadriplegia, unspecified: Secondary | ICD-10-CM | POA: Diagnosis not present

## 2017-02-15 DIAGNOSIS — G834 Cauda equina syndrome: Secondary | ICD-10-CM | POA: Diagnosis not present

## 2017-02-15 DIAGNOSIS — Z791 Long term (current) use of non-steroidal anti-inflammatories (NSAID): Secondary | ICD-10-CM | POA: Diagnosis not present

## 2017-02-15 DIAGNOSIS — Z7982 Long term (current) use of aspirin: Secondary | ICD-10-CM | POA: Diagnosis not present

## 2017-02-15 DIAGNOSIS — Z466 Encounter for fitting and adjustment of urinary device: Secondary | ICD-10-CM | POA: Diagnosis not present

## 2017-02-15 DIAGNOSIS — G8250 Quadriplegia, unspecified: Secondary | ICD-10-CM | POA: Diagnosis not present

## 2017-02-17 DIAGNOSIS — Z7982 Long term (current) use of aspirin: Secondary | ICD-10-CM | POA: Diagnosis not present

## 2017-02-17 DIAGNOSIS — Z791 Long term (current) use of non-steroidal anti-inflammatories (NSAID): Secondary | ICD-10-CM | POA: Diagnosis not present

## 2017-02-17 DIAGNOSIS — G8250 Quadriplegia, unspecified: Secondary | ICD-10-CM | POA: Diagnosis not present

## 2017-02-17 DIAGNOSIS — Z466 Encounter for fitting and adjustment of urinary device: Secondary | ICD-10-CM | POA: Diagnosis not present

## 2017-02-17 DIAGNOSIS — G834 Cauda equina syndrome: Secondary | ICD-10-CM | POA: Diagnosis not present

## 2017-02-21 DIAGNOSIS — G834 Cauda equina syndrome: Secondary | ICD-10-CM | POA: Diagnosis not present

## 2017-02-21 DIAGNOSIS — G8250 Quadriplegia, unspecified: Secondary | ICD-10-CM | POA: Diagnosis not present

## 2017-02-21 DIAGNOSIS — Z466 Encounter for fitting and adjustment of urinary device: Secondary | ICD-10-CM | POA: Diagnosis not present

## 2017-02-21 DIAGNOSIS — Z791 Long term (current) use of non-steroidal anti-inflammatories (NSAID): Secondary | ICD-10-CM | POA: Diagnosis not present

## 2017-02-21 DIAGNOSIS — Z7982 Long term (current) use of aspirin: Secondary | ICD-10-CM | POA: Diagnosis not present

## 2017-02-22 DIAGNOSIS — Z791 Long term (current) use of non-steroidal anti-inflammatories (NSAID): Secondary | ICD-10-CM | POA: Diagnosis not present

## 2017-02-22 DIAGNOSIS — Z7982 Long term (current) use of aspirin: Secondary | ICD-10-CM | POA: Diagnosis not present

## 2017-02-22 DIAGNOSIS — Z466 Encounter for fitting and adjustment of urinary device: Secondary | ICD-10-CM | POA: Diagnosis not present

## 2017-02-22 DIAGNOSIS — G8250 Quadriplegia, unspecified: Secondary | ICD-10-CM | POA: Diagnosis not present

## 2017-02-22 DIAGNOSIS — G834 Cauda equina syndrome: Secondary | ICD-10-CM | POA: Diagnosis not present

## 2017-02-24 DIAGNOSIS — Z466 Encounter for fitting and adjustment of urinary device: Secondary | ICD-10-CM | POA: Diagnosis not present

## 2017-02-24 DIAGNOSIS — Z7982 Long term (current) use of aspirin: Secondary | ICD-10-CM | POA: Diagnosis not present

## 2017-02-24 DIAGNOSIS — Z791 Long term (current) use of non-steroidal anti-inflammatories (NSAID): Secondary | ICD-10-CM | POA: Diagnosis not present

## 2017-02-24 DIAGNOSIS — G834 Cauda equina syndrome: Secondary | ICD-10-CM | POA: Diagnosis not present

## 2017-02-24 DIAGNOSIS — G8250 Quadriplegia, unspecified: Secondary | ICD-10-CM | POA: Diagnosis not present

## 2017-02-27 DIAGNOSIS — G834 Cauda equina syndrome: Secondary | ICD-10-CM | POA: Diagnosis not present

## 2017-02-27 DIAGNOSIS — Z791 Long term (current) use of non-steroidal anti-inflammatories (NSAID): Secondary | ICD-10-CM | POA: Diagnosis not present

## 2017-02-27 DIAGNOSIS — G8250 Quadriplegia, unspecified: Secondary | ICD-10-CM | POA: Diagnosis not present

## 2017-02-27 DIAGNOSIS — Z466 Encounter for fitting and adjustment of urinary device: Secondary | ICD-10-CM | POA: Diagnosis not present

## 2017-02-27 DIAGNOSIS — Z7982 Long term (current) use of aspirin: Secondary | ICD-10-CM | POA: Diagnosis not present

## 2017-03-03 DIAGNOSIS — Z466 Encounter for fitting and adjustment of urinary device: Secondary | ICD-10-CM | POA: Diagnosis not present

## 2017-03-03 DIAGNOSIS — Z791 Long term (current) use of non-steroidal anti-inflammatories (NSAID): Secondary | ICD-10-CM | POA: Diagnosis not present

## 2017-03-03 DIAGNOSIS — G834 Cauda equina syndrome: Secondary | ICD-10-CM | POA: Diagnosis not present

## 2017-03-03 DIAGNOSIS — Z7982 Long term (current) use of aspirin: Secondary | ICD-10-CM | POA: Diagnosis not present

## 2017-03-03 DIAGNOSIS — G8250 Quadriplegia, unspecified: Secondary | ICD-10-CM | POA: Diagnosis not present

## 2017-03-07 DIAGNOSIS — G834 Cauda equina syndrome: Secondary | ICD-10-CM | POA: Diagnosis not present

## 2017-03-07 DIAGNOSIS — Z466 Encounter for fitting and adjustment of urinary device: Secondary | ICD-10-CM | POA: Diagnosis not present

## 2017-03-07 DIAGNOSIS — Z791 Long term (current) use of non-steroidal anti-inflammatories (NSAID): Secondary | ICD-10-CM | POA: Diagnosis not present

## 2017-03-07 DIAGNOSIS — Z7982 Long term (current) use of aspirin: Secondary | ICD-10-CM | POA: Diagnosis not present

## 2017-03-07 DIAGNOSIS — G8250 Quadriplegia, unspecified: Secondary | ICD-10-CM | POA: Diagnosis not present

## 2017-03-10 DIAGNOSIS — G8250 Quadriplegia, unspecified: Secondary | ICD-10-CM | POA: Diagnosis not present

## 2017-03-10 DIAGNOSIS — Z466 Encounter for fitting and adjustment of urinary device: Secondary | ICD-10-CM | POA: Diagnosis not present

## 2017-03-10 DIAGNOSIS — Z7982 Long term (current) use of aspirin: Secondary | ICD-10-CM | POA: Diagnosis not present

## 2017-03-10 DIAGNOSIS — G834 Cauda equina syndrome: Secondary | ICD-10-CM | POA: Diagnosis not present

## 2017-03-10 DIAGNOSIS — Z791 Long term (current) use of non-steroidal anti-inflammatories (NSAID): Secondary | ICD-10-CM | POA: Diagnosis not present

## 2017-03-14 DIAGNOSIS — G8250 Quadriplegia, unspecified: Secondary | ICD-10-CM | POA: Diagnosis not present

## 2017-03-14 DIAGNOSIS — Z466 Encounter for fitting and adjustment of urinary device: Secondary | ICD-10-CM | POA: Diagnosis not present

## 2017-03-14 DIAGNOSIS — Z7982 Long term (current) use of aspirin: Secondary | ICD-10-CM | POA: Diagnosis not present

## 2017-03-14 DIAGNOSIS — Z791 Long term (current) use of non-steroidal anti-inflammatories (NSAID): Secondary | ICD-10-CM | POA: Diagnosis not present

## 2017-03-14 DIAGNOSIS — G834 Cauda equina syndrome: Secondary | ICD-10-CM | POA: Diagnosis not present

## 2017-03-15 DIAGNOSIS — Z466 Encounter for fitting and adjustment of urinary device: Secondary | ICD-10-CM | POA: Diagnosis not present

## 2017-03-15 DIAGNOSIS — G834 Cauda equina syndrome: Secondary | ICD-10-CM | POA: Diagnosis not present

## 2017-03-15 DIAGNOSIS — G8250 Quadriplegia, unspecified: Secondary | ICD-10-CM | POA: Diagnosis not present

## 2017-03-15 DIAGNOSIS — Z791 Long term (current) use of non-steroidal anti-inflammatories (NSAID): Secondary | ICD-10-CM | POA: Diagnosis not present

## 2017-03-15 DIAGNOSIS — Z7982 Long term (current) use of aspirin: Secondary | ICD-10-CM | POA: Diagnosis not present

## 2017-03-17 DIAGNOSIS — G8250 Quadriplegia, unspecified: Secondary | ICD-10-CM | POA: Diagnosis not present

## 2017-03-17 DIAGNOSIS — G834 Cauda equina syndrome: Secondary | ICD-10-CM | POA: Diagnosis not present

## 2017-03-17 DIAGNOSIS — Z791 Long term (current) use of non-steroidal anti-inflammatories (NSAID): Secondary | ICD-10-CM | POA: Diagnosis not present

## 2017-03-17 DIAGNOSIS — Z466 Encounter for fitting and adjustment of urinary device: Secondary | ICD-10-CM | POA: Diagnosis not present

## 2017-03-17 DIAGNOSIS — Z7982 Long term (current) use of aspirin: Secondary | ICD-10-CM | POA: Diagnosis not present

## 2017-03-21 DIAGNOSIS — Z466 Encounter for fitting and adjustment of urinary device: Secondary | ICD-10-CM | POA: Diagnosis not present

## 2017-03-21 DIAGNOSIS — G834 Cauda equina syndrome: Secondary | ICD-10-CM | POA: Diagnosis not present

## 2017-03-21 DIAGNOSIS — Z791 Long term (current) use of non-steroidal anti-inflammatories (NSAID): Secondary | ICD-10-CM | POA: Diagnosis not present

## 2017-03-21 DIAGNOSIS — Z7982 Long term (current) use of aspirin: Secondary | ICD-10-CM | POA: Diagnosis not present

## 2017-03-21 DIAGNOSIS — G8250 Quadriplegia, unspecified: Secondary | ICD-10-CM | POA: Diagnosis not present

## 2017-03-24 DIAGNOSIS — G8250 Quadriplegia, unspecified: Secondary | ICD-10-CM | POA: Diagnosis not present

## 2017-03-24 DIAGNOSIS — Z7982 Long term (current) use of aspirin: Secondary | ICD-10-CM | POA: Diagnosis not present

## 2017-03-24 DIAGNOSIS — G834 Cauda equina syndrome: Secondary | ICD-10-CM | POA: Diagnosis not present

## 2017-03-24 DIAGNOSIS — Z791 Long term (current) use of non-steroidal anti-inflammatories (NSAID): Secondary | ICD-10-CM | POA: Diagnosis not present

## 2017-03-24 DIAGNOSIS — Z466 Encounter for fitting and adjustment of urinary device: Secondary | ICD-10-CM | POA: Diagnosis not present

## 2017-03-29 DIAGNOSIS — Z466 Encounter for fitting and adjustment of urinary device: Secondary | ICD-10-CM | POA: Diagnosis not present

## 2017-03-29 DIAGNOSIS — G8250 Quadriplegia, unspecified: Secondary | ICD-10-CM | POA: Diagnosis not present

## 2017-03-29 DIAGNOSIS — Z791 Long term (current) use of non-steroidal anti-inflammatories (NSAID): Secondary | ICD-10-CM | POA: Diagnosis not present

## 2017-03-29 DIAGNOSIS — G834 Cauda equina syndrome: Secondary | ICD-10-CM | POA: Diagnosis not present

## 2017-03-29 DIAGNOSIS — Z7982 Long term (current) use of aspirin: Secondary | ICD-10-CM | POA: Diagnosis not present

## 2017-04-04 DIAGNOSIS — G8250 Quadriplegia, unspecified: Secondary | ICD-10-CM | POA: Diagnosis not present

## 2017-04-04 DIAGNOSIS — G834 Cauda equina syndrome: Secondary | ICD-10-CM | POA: Diagnosis not present

## 2017-04-04 DIAGNOSIS — Z791 Long term (current) use of non-steroidal anti-inflammatories (NSAID): Secondary | ICD-10-CM | POA: Diagnosis not present

## 2017-04-04 DIAGNOSIS — Z7982 Long term (current) use of aspirin: Secondary | ICD-10-CM | POA: Diagnosis not present

## 2017-04-04 DIAGNOSIS — Z466 Encounter for fitting and adjustment of urinary device: Secondary | ICD-10-CM | POA: Diagnosis not present

## 2017-04-05 DIAGNOSIS — G834 Cauda equina syndrome: Secondary | ICD-10-CM | POA: Diagnosis not present

## 2017-04-05 DIAGNOSIS — Z791 Long term (current) use of non-steroidal anti-inflammatories (NSAID): Secondary | ICD-10-CM | POA: Diagnosis not present

## 2017-04-05 DIAGNOSIS — Z466 Encounter for fitting and adjustment of urinary device: Secondary | ICD-10-CM | POA: Diagnosis not present

## 2017-04-05 DIAGNOSIS — Z7982 Long term (current) use of aspirin: Secondary | ICD-10-CM | POA: Diagnosis not present

## 2017-04-05 DIAGNOSIS — G8250 Quadriplegia, unspecified: Secondary | ICD-10-CM | POA: Diagnosis not present

## 2017-04-07 DIAGNOSIS — Z791 Long term (current) use of non-steroidal anti-inflammatories (NSAID): Secondary | ICD-10-CM | POA: Diagnosis not present

## 2017-04-07 DIAGNOSIS — G834 Cauda equina syndrome: Secondary | ICD-10-CM | POA: Diagnosis not present

## 2017-04-07 DIAGNOSIS — Z466 Encounter for fitting and adjustment of urinary device: Secondary | ICD-10-CM | POA: Diagnosis not present

## 2017-04-07 DIAGNOSIS — Z7982 Long term (current) use of aspirin: Secondary | ICD-10-CM | POA: Diagnosis not present

## 2017-04-07 DIAGNOSIS — G8250 Quadriplegia, unspecified: Secondary | ICD-10-CM | POA: Diagnosis not present

## 2017-04-11 DIAGNOSIS — Z466 Encounter for fitting and adjustment of urinary device: Secondary | ICD-10-CM | POA: Diagnosis not present

## 2017-04-11 DIAGNOSIS — G8250 Quadriplegia, unspecified: Secondary | ICD-10-CM | POA: Diagnosis not present

## 2017-04-11 DIAGNOSIS — Z791 Long term (current) use of non-steroidal anti-inflammatories (NSAID): Secondary | ICD-10-CM | POA: Diagnosis not present

## 2017-04-11 DIAGNOSIS — Z7982 Long term (current) use of aspirin: Secondary | ICD-10-CM | POA: Diagnosis not present

## 2017-04-11 DIAGNOSIS — G834 Cauda equina syndrome: Secondary | ICD-10-CM | POA: Diagnosis not present

## 2017-04-14 DIAGNOSIS — G8250 Quadriplegia, unspecified: Secondary | ICD-10-CM | POA: Diagnosis not present

## 2017-04-14 DIAGNOSIS — G834 Cauda equina syndrome: Secondary | ICD-10-CM | POA: Diagnosis not present

## 2017-04-14 DIAGNOSIS — Z466 Encounter for fitting and adjustment of urinary device: Secondary | ICD-10-CM | POA: Diagnosis not present

## 2017-04-14 DIAGNOSIS — Z791 Long term (current) use of non-steroidal anti-inflammatories (NSAID): Secondary | ICD-10-CM | POA: Diagnosis not present

## 2017-04-14 DIAGNOSIS — Z7982 Long term (current) use of aspirin: Secondary | ICD-10-CM | POA: Diagnosis not present

## 2017-04-18 DIAGNOSIS — G834 Cauda equina syndrome: Secondary | ICD-10-CM | POA: Diagnosis not present

## 2017-04-18 DIAGNOSIS — Z7982 Long term (current) use of aspirin: Secondary | ICD-10-CM | POA: Diagnosis not present

## 2017-04-18 DIAGNOSIS — Z466 Encounter for fitting and adjustment of urinary device: Secondary | ICD-10-CM | POA: Diagnosis not present

## 2017-04-18 DIAGNOSIS — G8250 Quadriplegia, unspecified: Secondary | ICD-10-CM | POA: Diagnosis not present

## 2017-04-18 DIAGNOSIS — Z791 Long term (current) use of non-steroidal anti-inflammatories (NSAID): Secondary | ICD-10-CM | POA: Diagnosis not present

## 2017-04-19 DIAGNOSIS — Z791 Long term (current) use of non-steroidal anti-inflammatories (NSAID): Secondary | ICD-10-CM | POA: Diagnosis not present

## 2017-04-19 DIAGNOSIS — G8250 Quadriplegia, unspecified: Secondary | ICD-10-CM | POA: Diagnosis not present

## 2017-04-19 DIAGNOSIS — G834 Cauda equina syndrome: Secondary | ICD-10-CM | POA: Diagnosis not present

## 2017-04-19 DIAGNOSIS — Z7982 Long term (current) use of aspirin: Secondary | ICD-10-CM | POA: Diagnosis not present

## 2017-04-19 DIAGNOSIS — Z466 Encounter for fitting and adjustment of urinary device: Secondary | ICD-10-CM | POA: Diagnosis not present

## 2017-04-21 DIAGNOSIS — G834 Cauda equina syndrome: Secondary | ICD-10-CM | POA: Diagnosis not present

## 2017-04-21 DIAGNOSIS — G8250 Quadriplegia, unspecified: Secondary | ICD-10-CM | POA: Diagnosis not present

## 2017-04-21 DIAGNOSIS — Z791 Long term (current) use of non-steroidal anti-inflammatories (NSAID): Secondary | ICD-10-CM | POA: Diagnosis not present

## 2017-04-21 DIAGNOSIS — Z7982 Long term (current) use of aspirin: Secondary | ICD-10-CM | POA: Diagnosis not present

## 2017-04-21 DIAGNOSIS — Z466 Encounter for fitting and adjustment of urinary device: Secondary | ICD-10-CM | POA: Diagnosis not present

## 2017-04-25 DIAGNOSIS — Z791 Long term (current) use of non-steroidal anti-inflammatories (NSAID): Secondary | ICD-10-CM | POA: Diagnosis not present

## 2017-04-25 DIAGNOSIS — Z7982 Long term (current) use of aspirin: Secondary | ICD-10-CM | POA: Diagnosis not present

## 2017-04-25 DIAGNOSIS — Z466 Encounter for fitting and adjustment of urinary device: Secondary | ICD-10-CM | POA: Diagnosis not present

## 2017-04-25 DIAGNOSIS — G834 Cauda equina syndrome: Secondary | ICD-10-CM | POA: Diagnosis not present

## 2017-04-25 DIAGNOSIS — G8250 Quadriplegia, unspecified: Secondary | ICD-10-CM | POA: Diagnosis not present

## 2017-04-28 DIAGNOSIS — G834 Cauda equina syndrome: Secondary | ICD-10-CM | POA: Diagnosis not present

## 2017-04-28 DIAGNOSIS — Z791 Long term (current) use of non-steroidal anti-inflammatories (NSAID): Secondary | ICD-10-CM | POA: Diagnosis not present

## 2017-04-28 DIAGNOSIS — Z7982 Long term (current) use of aspirin: Secondary | ICD-10-CM | POA: Diagnosis not present

## 2017-04-28 DIAGNOSIS — Z466 Encounter for fitting and adjustment of urinary device: Secondary | ICD-10-CM | POA: Diagnosis not present

## 2017-04-28 DIAGNOSIS — G8250 Quadriplegia, unspecified: Secondary | ICD-10-CM | POA: Diagnosis not present

## 2017-05-02 DIAGNOSIS — Z466 Encounter for fitting and adjustment of urinary device: Secondary | ICD-10-CM | POA: Diagnosis not present

## 2017-05-02 DIAGNOSIS — Z7982 Long term (current) use of aspirin: Secondary | ICD-10-CM | POA: Diagnosis not present

## 2017-05-02 DIAGNOSIS — Z791 Long term (current) use of non-steroidal anti-inflammatories (NSAID): Secondary | ICD-10-CM | POA: Diagnosis not present

## 2017-05-02 DIAGNOSIS — G8250 Quadriplegia, unspecified: Secondary | ICD-10-CM | POA: Diagnosis not present

## 2017-05-02 DIAGNOSIS — G834 Cauda equina syndrome: Secondary | ICD-10-CM | POA: Diagnosis not present

## 2017-05-04 DIAGNOSIS — L89159 Pressure ulcer of sacral region, unspecified stage: Secondary | ICD-10-CM | POA: Diagnosis not present

## 2017-05-04 DIAGNOSIS — Z993 Dependence on wheelchair: Secondary | ICD-10-CM | POA: Diagnosis not present

## 2017-05-04 DIAGNOSIS — G8250 Quadriplegia, unspecified: Secondary | ICD-10-CM | POA: Diagnosis not present

## 2017-05-05 DIAGNOSIS — G834 Cauda equina syndrome: Secondary | ICD-10-CM | POA: Diagnosis not present

## 2017-05-05 DIAGNOSIS — Z7982 Long term (current) use of aspirin: Secondary | ICD-10-CM | POA: Diagnosis not present

## 2017-05-05 DIAGNOSIS — Z791 Long term (current) use of non-steroidal anti-inflammatories (NSAID): Secondary | ICD-10-CM | POA: Diagnosis not present

## 2017-05-05 DIAGNOSIS — Z466 Encounter for fitting and adjustment of urinary device: Secondary | ICD-10-CM | POA: Diagnosis not present

## 2017-05-05 DIAGNOSIS — G8250 Quadriplegia, unspecified: Secondary | ICD-10-CM | POA: Diagnosis not present

## 2017-05-09 DIAGNOSIS — G8250 Quadriplegia, unspecified: Secondary | ICD-10-CM | POA: Diagnosis not present

## 2017-05-09 DIAGNOSIS — Z7982 Long term (current) use of aspirin: Secondary | ICD-10-CM | POA: Diagnosis not present

## 2017-05-09 DIAGNOSIS — Z791 Long term (current) use of non-steroidal anti-inflammatories (NSAID): Secondary | ICD-10-CM | POA: Diagnosis not present

## 2017-05-09 DIAGNOSIS — Z466 Encounter for fitting and adjustment of urinary device: Secondary | ICD-10-CM | POA: Diagnosis not present

## 2017-05-09 DIAGNOSIS — G834 Cauda equina syndrome: Secondary | ICD-10-CM | POA: Diagnosis not present

## 2017-05-10 DIAGNOSIS — Z7982 Long term (current) use of aspirin: Secondary | ICD-10-CM | POA: Diagnosis not present

## 2017-05-10 DIAGNOSIS — Z466 Encounter for fitting and adjustment of urinary device: Secondary | ICD-10-CM | POA: Diagnosis not present

## 2017-05-10 DIAGNOSIS — Z791 Long term (current) use of non-steroidal anti-inflammatories (NSAID): Secondary | ICD-10-CM | POA: Diagnosis not present

## 2017-05-10 DIAGNOSIS — G834 Cauda equina syndrome: Secondary | ICD-10-CM | POA: Diagnosis not present

## 2017-05-10 DIAGNOSIS — G8250 Quadriplegia, unspecified: Secondary | ICD-10-CM | POA: Diagnosis not present

## 2017-05-12 DIAGNOSIS — Z466 Encounter for fitting and adjustment of urinary device: Secondary | ICD-10-CM | POA: Diagnosis not present

## 2017-05-12 DIAGNOSIS — Z791 Long term (current) use of non-steroidal anti-inflammatories (NSAID): Secondary | ICD-10-CM | POA: Diagnosis not present

## 2017-05-12 DIAGNOSIS — G834 Cauda equina syndrome: Secondary | ICD-10-CM | POA: Diagnosis not present

## 2017-05-12 DIAGNOSIS — Z7982 Long term (current) use of aspirin: Secondary | ICD-10-CM | POA: Diagnosis not present

## 2017-05-12 DIAGNOSIS — G8250 Quadriplegia, unspecified: Secondary | ICD-10-CM | POA: Diagnosis not present

## 2017-05-16 DIAGNOSIS — Z993 Dependence on wheelchair: Secondary | ICD-10-CM | POA: Diagnosis not present

## 2017-05-16 DIAGNOSIS — L89159 Pressure ulcer of sacral region, unspecified stage: Secondary | ICD-10-CM | POA: Diagnosis not present

## 2017-05-16 DIAGNOSIS — Z791 Long term (current) use of non-steroidal anti-inflammatories (NSAID): Secondary | ICD-10-CM | POA: Diagnosis not present

## 2017-05-16 DIAGNOSIS — G8250 Quadriplegia, unspecified: Secondary | ICD-10-CM | POA: Diagnosis not present

## 2017-05-16 DIAGNOSIS — Z7982 Long term (current) use of aspirin: Secondary | ICD-10-CM | POA: Diagnosis not present

## 2017-05-16 DIAGNOSIS — Z466 Encounter for fitting and adjustment of urinary device: Secondary | ICD-10-CM | POA: Diagnosis not present

## 2017-05-16 DIAGNOSIS — G834 Cauda equina syndrome: Secondary | ICD-10-CM | POA: Diagnosis not present

## 2017-05-19 DIAGNOSIS — Z466 Encounter for fitting and adjustment of urinary device: Secondary | ICD-10-CM | POA: Diagnosis not present

## 2017-05-19 DIAGNOSIS — Z7982 Long term (current) use of aspirin: Secondary | ICD-10-CM | POA: Diagnosis not present

## 2017-05-19 DIAGNOSIS — Z791 Long term (current) use of non-steroidal anti-inflammatories (NSAID): Secondary | ICD-10-CM | POA: Diagnosis not present

## 2017-05-19 DIAGNOSIS — G8250 Quadriplegia, unspecified: Secondary | ICD-10-CM | POA: Diagnosis not present

## 2017-05-19 DIAGNOSIS — G834 Cauda equina syndrome: Secondary | ICD-10-CM | POA: Diagnosis not present

## 2017-05-23 DIAGNOSIS — Z791 Long term (current) use of non-steroidal anti-inflammatories (NSAID): Secondary | ICD-10-CM | POA: Diagnosis not present

## 2017-05-23 DIAGNOSIS — G834 Cauda equina syndrome: Secondary | ICD-10-CM | POA: Diagnosis not present

## 2017-05-23 DIAGNOSIS — G8250 Quadriplegia, unspecified: Secondary | ICD-10-CM | POA: Diagnosis not present

## 2017-05-23 DIAGNOSIS — Z7982 Long term (current) use of aspirin: Secondary | ICD-10-CM | POA: Diagnosis not present

## 2017-05-23 DIAGNOSIS — Z466 Encounter for fitting and adjustment of urinary device: Secondary | ICD-10-CM | POA: Diagnosis not present

## 2017-05-26 DIAGNOSIS — Z7982 Long term (current) use of aspirin: Secondary | ICD-10-CM | POA: Diagnosis not present

## 2017-05-26 DIAGNOSIS — Z791 Long term (current) use of non-steroidal anti-inflammatories (NSAID): Secondary | ICD-10-CM | POA: Diagnosis not present

## 2017-05-26 DIAGNOSIS — G834 Cauda equina syndrome: Secondary | ICD-10-CM | POA: Diagnosis not present

## 2017-05-26 DIAGNOSIS — Z466 Encounter for fitting and adjustment of urinary device: Secondary | ICD-10-CM | POA: Diagnosis not present

## 2017-05-26 DIAGNOSIS — G8250 Quadriplegia, unspecified: Secondary | ICD-10-CM | POA: Diagnosis not present

## 2017-05-30 DIAGNOSIS — Z7982 Long term (current) use of aspirin: Secondary | ICD-10-CM | POA: Diagnosis not present

## 2017-05-30 DIAGNOSIS — Z466 Encounter for fitting and adjustment of urinary device: Secondary | ICD-10-CM | POA: Diagnosis not present

## 2017-05-30 DIAGNOSIS — G8250 Quadriplegia, unspecified: Secondary | ICD-10-CM | POA: Diagnosis not present

## 2017-05-30 DIAGNOSIS — G834 Cauda equina syndrome: Secondary | ICD-10-CM | POA: Diagnosis not present

## 2017-05-30 DIAGNOSIS — Z791 Long term (current) use of non-steroidal anti-inflammatories (NSAID): Secondary | ICD-10-CM | POA: Diagnosis not present

## 2017-05-31 DIAGNOSIS — G8250 Quadriplegia, unspecified: Secondary | ICD-10-CM | POA: Diagnosis not present

## 2017-05-31 DIAGNOSIS — Z7982 Long term (current) use of aspirin: Secondary | ICD-10-CM | POA: Diagnosis not present

## 2017-05-31 DIAGNOSIS — Z791 Long term (current) use of non-steroidal anti-inflammatories (NSAID): Secondary | ICD-10-CM | POA: Diagnosis not present

## 2017-05-31 DIAGNOSIS — Z466 Encounter for fitting and adjustment of urinary device: Secondary | ICD-10-CM | POA: Diagnosis not present

## 2017-05-31 DIAGNOSIS — G834 Cauda equina syndrome: Secondary | ICD-10-CM | POA: Diagnosis not present

## 2017-06-02 DIAGNOSIS — Z466 Encounter for fitting and adjustment of urinary device: Secondary | ICD-10-CM | POA: Diagnosis not present

## 2017-06-02 DIAGNOSIS — G834 Cauda equina syndrome: Secondary | ICD-10-CM | POA: Diagnosis not present

## 2017-06-02 DIAGNOSIS — Z7982 Long term (current) use of aspirin: Secondary | ICD-10-CM | POA: Diagnosis not present

## 2017-06-02 DIAGNOSIS — G8250 Quadriplegia, unspecified: Secondary | ICD-10-CM | POA: Diagnosis not present

## 2017-06-02 DIAGNOSIS — Z791 Long term (current) use of non-steroidal anti-inflammatories (NSAID): Secondary | ICD-10-CM | POA: Diagnosis not present

## 2017-06-03 DIAGNOSIS — R5381 Other malaise: Secondary | ICD-10-CM | POA: Diagnosis not present

## 2017-06-03 DIAGNOSIS — S129XXA Fracture of neck, unspecified, initial encounter: Secondary | ICD-10-CM | POA: Diagnosis not present

## 2017-06-03 DIAGNOSIS — G8250 Quadriplegia, unspecified: Secondary | ICD-10-CM | POA: Diagnosis not present

## 2017-06-03 DIAGNOSIS — L89159 Pressure ulcer of sacral region, unspecified stage: Secondary | ICD-10-CM | POA: Diagnosis not present

## 2017-06-03 DIAGNOSIS — Z882 Allergy status to sulfonamides status: Secondary | ICD-10-CM | POA: Diagnosis not present

## 2017-06-03 DIAGNOSIS — Z993 Dependence on wheelchair: Secondary | ICD-10-CM | POA: Diagnosis not present

## 2017-06-06 ENCOUNTER — Other Ambulatory Visit: Payer: Self-pay | Admitting: Internal Medicine

## 2017-06-06 DIAGNOSIS — Z1231 Encounter for screening mammogram for malignant neoplasm of breast: Secondary | ICD-10-CM

## 2017-06-06 DIAGNOSIS — G8250 Quadriplegia, unspecified: Secondary | ICD-10-CM | POA: Diagnosis not present

## 2017-06-06 DIAGNOSIS — Z466 Encounter for fitting and adjustment of urinary device: Secondary | ICD-10-CM | POA: Diagnosis not present

## 2017-06-06 DIAGNOSIS — Z791 Long term (current) use of non-steroidal anti-inflammatories (NSAID): Secondary | ICD-10-CM | POA: Diagnosis not present

## 2017-06-06 DIAGNOSIS — Z7982 Long term (current) use of aspirin: Secondary | ICD-10-CM | POA: Diagnosis not present

## 2017-06-06 DIAGNOSIS — G834 Cauda equina syndrome: Secondary | ICD-10-CM | POA: Diagnosis not present

## 2017-06-08 DIAGNOSIS — Z466 Encounter for fitting and adjustment of urinary device: Secondary | ICD-10-CM | POA: Diagnosis not present

## 2017-06-08 DIAGNOSIS — G8250 Quadriplegia, unspecified: Secondary | ICD-10-CM | POA: Diagnosis not present

## 2017-06-08 DIAGNOSIS — G834 Cauda equina syndrome: Secondary | ICD-10-CM | POA: Diagnosis not present

## 2017-06-08 DIAGNOSIS — Z7982 Long term (current) use of aspirin: Secondary | ICD-10-CM | POA: Diagnosis not present

## 2017-06-08 DIAGNOSIS — Z791 Long term (current) use of non-steroidal anti-inflammatories (NSAID): Secondary | ICD-10-CM | POA: Diagnosis not present

## 2017-06-09 DIAGNOSIS — G834 Cauda equina syndrome: Secondary | ICD-10-CM | POA: Diagnosis not present

## 2017-06-09 DIAGNOSIS — Z466 Encounter for fitting and adjustment of urinary device: Secondary | ICD-10-CM | POA: Diagnosis not present

## 2017-06-09 DIAGNOSIS — Z7982 Long term (current) use of aspirin: Secondary | ICD-10-CM | POA: Diagnosis not present

## 2017-06-09 DIAGNOSIS — G8250 Quadriplegia, unspecified: Secondary | ICD-10-CM | POA: Diagnosis not present

## 2017-06-09 DIAGNOSIS — Z791 Long term (current) use of non-steroidal anti-inflammatories (NSAID): Secondary | ICD-10-CM | POA: Diagnosis not present

## 2017-06-13 DIAGNOSIS — Z466 Encounter for fitting and adjustment of urinary device: Secondary | ICD-10-CM | POA: Diagnosis not present

## 2017-06-13 DIAGNOSIS — Z791 Long term (current) use of non-steroidal anti-inflammatories (NSAID): Secondary | ICD-10-CM | POA: Diagnosis not present

## 2017-06-13 DIAGNOSIS — G8250 Quadriplegia, unspecified: Secondary | ICD-10-CM | POA: Diagnosis not present

## 2017-06-13 DIAGNOSIS — G834 Cauda equina syndrome: Secondary | ICD-10-CM | POA: Diagnosis not present

## 2017-06-13 DIAGNOSIS — Z7982 Long term (current) use of aspirin: Secondary | ICD-10-CM | POA: Diagnosis not present

## 2017-06-16 DIAGNOSIS — Z791 Long term (current) use of non-steroidal anti-inflammatories (NSAID): Secondary | ICD-10-CM | POA: Diagnosis not present

## 2017-06-16 DIAGNOSIS — G834 Cauda equina syndrome: Secondary | ICD-10-CM | POA: Diagnosis not present

## 2017-06-16 DIAGNOSIS — G8250 Quadriplegia, unspecified: Secondary | ICD-10-CM | POA: Diagnosis not present

## 2017-06-16 DIAGNOSIS — Z7982 Long term (current) use of aspirin: Secondary | ICD-10-CM | POA: Diagnosis not present

## 2017-06-16 DIAGNOSIS — Z466 Encounter for fitting and adjustment of urinary device: Secondary | ICD-10-CM | POA: Diagnosis not present

## 2017-06-20 DIAGNOSIS — Z466 Encounter for fitting and adjustment of urinary device: Secondary | ICD-10-CM | POA: Diagnosis not present

## 2017-06-20 DIAGNOSIS — G8250 Quadriplegia, unspecified: Secondary | ICD-10-CM | POA: Diagnosis not present

## 2017-06-20 DIAGNOSIS — G834 Cauda equina syndrome: Secondary | ICD-10-CM | POA: Diagnosis not present

## 2017-06-20 DIAGNOSIS — Z7982 Long term (current) use of aspirin: Secondary | ICD-10-CM | POA: Diagnosis not present

## 2017-06-20 DIAGNOSIS — Z791 Long term (current) use of non-steroidal anti-inflammatories (NSAID): Secondary | ICD-10-CM | POA: Diagnosis not present

## 2017-06-22 DIAGNOSIS — Z7982 Long term (current) use of aspirin: Secondary | ICD-10-CM | POA: Diagnosis not present

## 2017-06-22 DIAGNOSIS — G834 Cauda equina syndrome: Secondary | ICD-10-CM | POA: Diagnosis not present

## 2017-06-22 DIAGNOSIS — G8250 Quadriplegia, unspecified: Secondary | ICD-10-CM | POA: Diagnosis not present

## 2017-06-22 DIAGNOSIS — Z466 Encounter for fitting and adjustment of urinary device: Secondary | ICD-10-CM | POA: Diagnosis not present

## 2017-06-22 DIAGNOSIS — Z791 Long term (current) use of non-steroidal anti-inflammatories (NSAID): Secondary | ICD-10-CM | POA: Diagnosis not present

## 2017-06-23 DIAGNOSIS — Z7982 Long term (current) use of aspirin: Secondary | ICD-10-CM | POA: Diagnosis not present

## 2017-06-23 DIAGNOSIS — G834 Cauda equina syndrome: Secondary | ICD-10-CM | POA: Diagnosis not present

## 2017-06-23 DIAGNOSIS — Z791 Long term (current) use of non-steroidal anti-inflammatories (NSAID): Secondary | ICD-10-CM | POA: Diagnosis not present

## 2017-06-23 DIAGNOSIS — Z466 Encounter for fitting and adjustment of urinary device: Secondary | ICD-10-CM | POA: Diagnosis not present

## 2017-06-23 DIAGNOSIS — G8250 Quadriplegia, unspecified: Secondary | ICD-10-CM | POA: Diagnosis not present

## 2017-06-24 DIAGNOSIS — Z993 Dependence on wheelchair: Secondary | ICD-10-CM | POA: Diagnosis not present

## 2017-06-24 DIAGNOSIS — L89159 Pressure ulcer of sacral region, unspecified stage: Secondary | ICD-10-CM | POA: Diagnosis not present

## 2017-06-24 DIAGNOSIS — G8250 Quadriplegia, unspecified: Secondary | ICD-10-CM | POA: Diagnosis not present

## 2017-06-27 DIAGNOSIS — G834 Cauda equina syndrome: Secondary | ICD-10-CM | POA: Diagnosis not present

## 2017-06-27 DIAGNOSIS — Z791 Long term (current) use of non-steroidal anti-inflammatories (NSAID): Secondary | ICD-10-CM | POA: Diagnosis not present

## 2017-06-27 DIAGNOSIS — Z466 Encounter for fitting and adjustment of urinary device: Secondary | ICD-10-CM | POA: Diagnosis not present

## 2017-06-27 DIAGNOSIS — Z7982 Long term (current) use of aspirin: Secondary | ICD-10-CM | POA: Diagnosis not present

## 2017-06-27 DIAGNOSIS — G8250 Quadriplegia, unspecified: Secondary | ICD-10-CM | POA: Diagnosis not present

## 2017-06-30 DIAGNOSIS — G834 Cauda equina syndrome: Secondary | ICD-10-CM | POA: Diagnosis not present

## 2017-06-30 DIAGNOSIS — Z791 Long term (current) use of non-steroidal anti-inflammatories (NSAID): Secondary | ICD-10-CM | POA: Diagnosis not present

## 2017-06-30 DIAGNOSIS — Z466 Encounter for fitting and adjustment of urinary device: Secondary | ICD-10-CM | POA: Diagnosis not present

## 2017-06-30 DIAGNOSIS — G8250 Quadriplegia, unspecified: Secondary | ICD-10-CM | POA: Diagnosis not present

## 2017-06-30 DIAGNOSIS — Z7982 Long term (current) use of aspirin: Secondary | ICD-10-CM | POA: Diagnosis not present

## 2017-07-04 DIAGNOSIS — G834 Cauda equina syndrome: Secondary | ICD-10-CM | POA: Diagnosis not present

## 2017-07-04 DIAGNOSIS — Z466 Encounter for fitting and adjustment of urinary device: Secondary | ICD-10-CM | POA: Diagnosis not present

## 2017-07-04 DIAGNOSIS — G8250 Quadriplegia, unspecified: Secondary | ICD-10-CM | POA: Diagnosis not present

## 2017-07-04 DIAGNOSIS — Z791 Long term (current) use of non-steroidal anti-inflammatories (NSAID): Secondary | ICD-10-CM | POA: Diagnosis not present

## 2017-07-04 DIAGNOSIS — Z7982 Long term (current) use of aspirin: Secondary | ICD-10-CM | POA: Diagnosis not present

## 2017-07-07 DIAGNOSIS — Z466 Encounter for fitting and adjustment of urinary device: Secondary | ICD-10-CM | POA: Diagnosis not present

## 2017-07-07 DIAGNOSIS — Z7982 Long term (current) use of aspirin: Secondary | ICD-10-CM | POA: Diagnosis not present

## 2017-07-07 DIAGNOSIS — Z791 Long term (current) use of non-steroidal anti-inflammatories (NSAID): Secondary | ICD-10-CM | POA: Diagnosis not present

## 2017-07-07 DIAGNOSIS — G834 Cauda equina syndrome: Secondary | ICD-10-CM | POA: Diagnosis not present

## 2017-07-07 DIAGNOSIS — G8250 Quadriplegia, unspecified: Secondary | ICD-10-CM | POA: Diagnosis not present

## 2017-07-08 ENCOUNTER — Ambulatory Visit
Admission: RE | Admit: 2017-07-08 | Discharge: 2017-07-08 | Disposition: A | Discharge status: Home / Self Care | Payer: Medicare Other | Source: Ambulatory Visit | Attending: Internal Medicine | Admitting: Internal Medicine

## 2017-07-08 ENCOUNTER — Other Ambulatory Visit: Payer: Self-pay | Admitting: Internal Medicine

## 2017-07-08 DIAGNOSIS — Z1231 Encounter for screening mammogram for malignant neoplasm of breast: Secondary | ICD-10-CM | POA: Diagnosis not present

## 2017-07-08 DIAGNOSIS — R05 Cough: Secondary | ICD-10-CM | POA: Diagnosis not present

## 2017-07-08 DIAGNOSIS — R0602 Shortness of breath: Secondary | ICD-10-CM | POA: Insufficient documentation

## 2017-07-08 DIAGNOSIS — R059 Cough, unspecified: Secondary | ICD-10-CM

## 2017-07-11 DIAGNOSIS — G834 Cauda equina syndrome: Secondary | ICD-10-CM | POA: Diagnosis not present

## 2017-07-11 DIAGNOSIS — Z791 Long term (current) use of non-steroidal anti-inflammatories (NSAID): Secondary | ICD-10-CM | POA: Diagnosis not present

## 2017-07-11 DIAGNOSIS — Z7982 Long term (current) use of aspirin: Secondary | ICD-10-CM | POA: Diagnosis not present

## 2017-07-11 DIAGNOSIS — Z466 Encounter for fitting and adjustment of urinary device: Secondary | ICD-10-CM | POA: Diagnosis not present

## 2017-07-11 DIAGNOSIS — G8250 Quadriplegia, unspecified: Secondary | ICD-10-CM | POA: Diagnosis not present

## 2017-07-12 DIAGNOSIS — G8250 Quadriplegia, unspecified: Secondary | ICD-10-CM | POA: Diagnosis not present

## 2017-07-12 DIAGNOSIS — Z7982 Long term (current) use of aspirin: Secondary | ICD-10-CM | POA: Diagnosis not present

## 2017-07-12 DIAGNOSIS — G834 Cauda equina syndrome: Secondary | ICD-10-CM | POA: Diagnosis not present

## 2017-07-12 DIAGNOSIS — Z791 Long term (current) use of non-steroidal anti-inflammatories (NSAID): Secondary | ICD-10-CM | POA: Diagnosis not present

## 2017-07-12 DIAGNOSIS — Z466 Encounter for fitting and adjustment of urinary device: Secondary | ICD-10-CM | POA: Diagnosis not present

## 2017-07-14 DIAGNOSIS — G8250 Quadriplegia, unspecified: Secondary | ICD-10-CM | POA: Diagnosis not present

## 2017-07-14 DIAGNOSIS — Z7982 Long term (current) use of aspirin: Secondary | ICD-10-CM | POA: Diagnosis not present

## 2017-07-14 DIAGNOSIS — Z466 Encounter for fitting and adjustment of urinary device: Secondary | ICD-10-CM | POA: Diagnosis not present

## 2017-07-14 DIAGNOSIS — Z791 Long term (current) use of non-steroidal anti-inflammatories (NSAID): Secondary | ICD-10-CM | POA: Diagnosis not present

## 2017-07-14 DIAGNOSIS — G834 Cauda equina syndrome: Secondary | ICD-10-CM | POA: Diagnosis not present

## 2017-07-18 DIAGNOSIS — Z791 Long term (current) use of non-steroidal anti-inflammatories (NSAID): Secondary | ICD-10-CM | POA: Diagnosis not present

## 2017-07-18 DIAGNOSIS — G8250 Quadriplegia, unspecified: Secondary | ICD-10-CM | POA: Diagnosis not present

## 2017-07-18 DIAGNOSIS — Z7982 Long term (current) use of aspirin: Secondary | ICD-10-CM | POA: Diagnosis not present

## 2017-07-18 DIAGNOSIS — Z466 Encounter for fitting and adjustment of urinary device: Secondary | ICD-10-CM | POA: Diagnosis not present

## 2017-07-18 DIAGNOSIS — G834 Cauda equina syndrome: Secondary | ICD-10-CM | POA: Diagnosis not present

## 2017-07-21 DIAGNOSIS — G834 Cauda equina syndrome: Secondary | ICD-10-CM | POA: Diagnosis not present

## 2017-07-21 DIAGNOSIS — G8250 Quadriplegia, unspecified: Secondary | ICD-10-CM | POA: Diagnosis not present

## 2017-07-21 DIAGNOSIS — Z7982 Long term (current) use of aspirin: Secondary | ICD-10-CM | POA: Diagnosis not present

## 2017-07-21 DIAGNOSIS — Z791 Long term (current) use of non-steroidal anti-inflammatories (NSAID): Secondary | ICD-10-CM | POA: Diagnosis not present

## 2017-07-21 DIAGNOSIS — Z466 Encounter for fitting and adjustment of urinary device: Secondary | ICD-10-CM | POA: Diagnosis not present

## 2017-07-22 DIAGNOSIS — K029 Dental caries, unspecified: Secondary | ICD-10-CM | POA: Diagnosis not present

## 2017-07-22 DIAGNOSIS — G8250 Quadriplegia, unspecified: Secondary | ICD-10-CM | POA: Diagnosis not present

## 2017-07-22 DIAGNOSIS — B3741 Candidal cystitis and urethritis: Secondary | ICD-10-CM | POA: Diagnosis not present

## 2017-07-22 DIAGNOSIS — R5381 Other malaise: Secondary | ICD-10-CM | POA: Diagnosis not present

## 2017-07-25 DIAGNOSIS — G8250 Quadriplegia, unspecified: Secondary | ICD-10-CM | POA: Diagnosis not present

## 2017-07-25 DIAGNOSIS — Z7982 Long term (current) use of aspirin: Secondary | ICD-10-CM | POA: Diagnosis not present

## 2017-07-25 DIAGNOSIS — Z466 Encounter for fitting and adjustment of urinary device: Secondary | ICD-10-CM | POA: Diagnosis not present

## 2017-07-25 DIAGNOSIS — Z791 Long term (current) use of non-steroidal anti-inflammatories (NSAID): Secondary | ICD-10-CM | POA: Diagnosis not present

## 2017-07-25 DIAGNOSIS — G834 Cauda equina syndrome: Secondary | ICD-10-CM | POA: Diagnosis not present

## 2017-07-27 DIAGNOSIS — G8250 Quadriplegia, unspecified: Secondary | ICD-10-CM | POA: Diagnosis not present

## 2017-07-27 DIAGNOSIS — Z993 Dependence on wheelchair: Secondary | ICD-10-CM | POA: Diagnosis not present

## 2017-07-27 DIAGNOSIS — L89159 Pressure ulcer of sacral region, unspecified stage: Secondary | ICD-10-CM | POA: Diagnosis not present

## 2017-07-28 DIAGNOSIS — Z7982 Long term (current) use of aspirin: Secondary | ICD-10-CM | POA: Diagnosis not present

## 2017-07-28 DIAGNOSIS — G834 Cauda equina syndrome: Secondary | ICD-10-CM | POA: Diagnosis not present

## 2017-07-28 DIAGNOSIS — N390 Urinary tract infection, site not specified: Secondary | ICD-10-CM | POA: Diagnosis not present

## 2017-07-28 DIAGNOSIS — G8250 Quadriplegia, unspecified: Secondary | ICD-10-CM | POA: Diagnosis not present

## 2017-07-28 DIAGNOSIS — Z466 Encounter for fitting and adjustment of urinary device: Secondary | ICD-10-CM | POA: Diagnosis not present

## 2017-07-28 DIAGNOSIS — Z791 Long term (current) use of non-steroidal anti-inflammatories (NSAID): Secondary | ICD-10-CM | POA: Diagnosis not present

## 2017-08-01 DIAGNOSIS — Z7982 Long term (current) use of aspirin: Secondary | ICD-10-CM | POA: Diagnosis not present

## 2017-08-01 DIAGNOSIS — Z466 Encounter for fitting and adjustment of urinary device: Secondary | ICD-10-CM | POA: Diagnosis not present

## 2017-08-01 DIAGNOSIS — G834 Cauda equina syndrome: Secondary | ICD-10-CM | POA: Diagnosis not present

## 2017-08-01 DIAGNOSIS — Z791 Long term (current) use of non-steroidal anti-inflammatories (NSAID): Secondary | ICD-10-CM | POA: Diagnosis not present

## 2017-08-01 DIAGNOSIS — G8250 Quadriplegia, unspecified: Secondary | ICD-10-CM | POA: Diagnosis not present

## 2017-08-02 DIAGNOSIS — Z7982 Long term (current) use of aspirin: Secondary | ICD-10-CM | POA: Diagnosis not present

## 2017-08-02 DIAGNOSIS — G8250 Quadriplegia, unspecified: Secondary | ICD-10-CM | POA: Diagnosis not present

## 2017-08-02 DIAGNOSIS — G834 Cauda equina syndrome: Secondary | ICD-10-CM | POA: Diagnosis not present

## 2017-08-02 DIAGNOSIS — Z466 Encounter for fitting and adjustment of urinary device: Secondary | ICD-10-CM | POA: Diagnosis not present

## 2017-08-02 DIAGNOSIS — Z791 Long term (current) use of non-steroidal anti-inflammatories (NSAID): Secondary | ICD-10-CM | POA: Diagnosis not present

## 2017-08-04 DIAGNOSIS — G834 Cauda equina syndrome: Secondary | ICD-10-CM | POA: Diagnosis not present

## 2017-08-04 DIAGNOSIS — G8250 Quadriplegia, unspecified: Secondary | ICD-10-CM | POA: Diagnosis not present

## 2017-08-04 DIAGNOSIS — Z466 Encounter for fitting and adjustment of urinary device: Secondary | ICD-10-CM | POA: Diagnosis not present

## 2017-08-04 DIAGNOSIS — Z791 Long term (current) use of non-steroidal anti-inflammatories (NSAID): Secondary | ICD-10-CM | POA: Diagnosis not present

## 2017-08-04 DIAGNOSIS — Z7982 Long term (current) use of aspirin: Secondary | ICD-10-CM | POA: Diagnosis not present

## 2017-08-05 DIAGNOSIS — Z993 Dependence on wheelchair: Secondary | ICD-10-CM | POA: Diagnosis not present

## 2017-08-05 DIAGNOSIS — L89159 Pressure ulcer of sacral region, unspecified stage: Secondary | ICD-10-CM | POA: Diagnosis not present

## 2017-08-05 DIAGNOSIS — G8250 Quadriplegia, unspecified: Secondary | ICD-10-CM | POA: Diagnosis not present

## 2017-08-08 DIAGNOSIS — Z791 Long term (current) use of non-steroidal anti-inflammatories (NSAID): Secondary | ICD-10-CM | POA: Diagnosis not present

## 2017-08-08 DIAGNOSIS — G834 Cauda equina syndrome: Secondary | ICD-10-CM | POA: Diagnosis not present

## 2017-08-08 DIAGNOSIS — Z466 Encounter for fitting and adjustment of urinary device: Secondary | ICD-10-CM | POA: Diagnosis not present

## 2017-08-08 DIAGNOSIS — Z7982 Long term (current) use of aspirin: Secondary | ICD-10-CM | POA: Diagnosis not present

## 2017-08-08 DIAGNOSIS — G8250 Quadriplegia, unspecified: Secondary | ICD-10-CM | POA: Diagnosis not present

## 2017-08-11 DIAGNOSIS — G834 Cauda equina syndrome: Secondary | ICD-10-CM | POA: Diagnosis not present

## 2017-08-11 DIAGNOSIS — Z993 Dependence on wheelchair: Secondary | ICD-10-CM | POA: Diagnosis not present

## 2017-08-11 DIAGNOSIS — Z791 Long term (current) use of non-steroidal anti-inflammatories (NSAID): Secondary | ICD-10-CM | POA: Diagnosis not present

## 2017-08-11 DIAGNOSIS — Z7982 Long term (current) use of aspirin: Secondary | ICD-10-CM | POA: Diagnosis not present

## 2017-08-11 DIAGNOSIS — G8250 Quadriplegia, unspecified: Secondary | ICD-10-CM | POA: Diagnosis not present

## 2017-08-11 DIAGNOSIS — Z466 Encounter for fitting and adjustment of urinary device: Secondary | ICD-10-CM | POA: Diagnosis not present

## 2017-08-11 DIAGNOSIS — L89159 Pressure ulcer of sacral region, unspecified stage: Secondary | ICD-10-CM | POA: Diagnosis not present

## 2017-08-15 DIAGNOSIS — G8250 Quadriplegia, unspecified: Secondary | ICD-10-CM | POA: Diagnosis not present

## 2017-08-15 DIAGNOSIS — Z7982 Long term (current) use of aspirin: Secondary | ICD-10-CM | POA: Diagnosis not present

## 2017-08-15 DIAGNOSIS — Z466 Encounter for fitting and adjustment of urinary device: Secondary | ICD-10-CM | POA: Diagnosis not present

## 2017-08-15 DIAGNOSIS — G834 Cauda equina syndrome: Secondary | ICD-10-CM | POA: Diagnosis not present

## 2017-08-15 DIAGNOSIS — Z791 Long term (current) use of non-steroidal anti-inflammatories (NSAID): Secondary | ICD-10-CM | POA: Diagnosis not present

## 2017-08-16 DIAGNOSIS — Z791 Long term (current) use of non-steroidal anti-inflammatories (NSAID): Secondary | ICD-10-CM | POA: Diagnosis not present

## 2017-08-16 DIAGNOSIS — G8250 Quadriplegia, unspecified: Secondary | ICD-10-CM | POA: Diagnosis not present

## 2017-08-16 DIAGNOSIS — Z7982 Long term (current) use of aspirin: Secondary | ICD-10-CM | POA: Diagnosis not present

## 2017-08-16 DIAGNOSIS — G834 Cauda equina syndrome: Secondary | ICD-10-CM | POA: Diagnosis not present

## 2017-08-16 DIAGNOSIS — Z466 Encounter for fitting and adjustment of urinary device: Secondary | ICD-10-CM | POA: Diagnosis not present

## 2017-08-17 DIAGNOSIS — Z466 Encounter for fitting and adjustment of urinary device: Secondary | ICD-10-CM | POA: Diagnosis not present

## 2017-08-17 DIAGNOSIS — G8250 Quadriplegia, unspecified: Secondary | ICD-10-CM | POA: Diagnosis not present

## 2017-08-17 DIAGNOSIS — Z791 Long term (current) use of non-steroidal anti-inflammatories (NSAID): Secondary | ICD-10-CM | POA: Diagnosis not present

## 2017-08-17 DIAGNOSIS — R6889 Other general symptoms and signs: Secondary | ICD-10-CM | POA: Diagnosis not present

## 2017-08-17 DIAGNOSIS — G834 Cauda equina syndrome: Secondary | ICD-10-CM | POA: Diagnosis not present

## 2017-08-17 DIAGNOSIS — Z7982 Long term (current) use of aspirin: Secondary | ICD-10-CM | POA: Diagnosis not present

## 2017-08-17 DIAGNOSIS — Z7409 Other reduced mobility: Secondary | ICD-10-CM | POA: Diagnosis not present

## 2017-08-22 DIAGNOSIS — Z466 Encounter for fitting and adjustment of urinary device: Secondary | ICD-10-CM | POA: Diagnosis not present

## 2017-08-22 DIAGNOSIS — Z7982 Long term (current) use of aspirin: Secondary | ICD-10-CM | POA: Diagnosis not present

## 2017-08-22 DIAGNOSIS — Z791 Long term (current) use of non-steroidal anti-inflammatories (NSAID): Secondary | ICD-10-CM | POA: Diagnosis not present

## 2017-08-22 DIAGNOSIS — G8250 Quadriplegia, unspecified: Secondary | ICD-10-CM | POA: Diagnosis not present

## 2017-08-22 DIAGNOSIS — G834 Cauda equina syndrome: Secondary | ICD-10-CM | POA: Diagnosis not present

## 2017-08-25 DIAGNOSIS — Z791 Long term (current) use of non-steroidal anti-inflammatories (NSAID): Secondary | ICD-10-CM | POA: Diagnosis not present

## 2017-08-25 DIAGNOSIS — Z7982 Long term (current) use of aspirin: Secondary | ICD-10-CM | POA: Diagnosis not present

## 2017-08-25 DIAGNOSIS — Z466 Encounter for fitting and adjustment of urinary device: Secondary | ICD-10-CM | POA: Diagnosis not present

## 2017-08-25 DIAGNOSIS — G834 Cauda equina syndrome: Secondary | ICD-10-CM | POA: Diagnosis not present

## 2017-08-25 DIAGNOSIS — G8250 Quadriplegia, unspecified: Secondary | ICD-10-CM | POA: Diagnosis not present

## 2017-08-29 DIAGNOSIS — G8250 Quadriplegia, unspecified: Secondary | ICD-10-CM | POA: Diagnosis not present

## 2017-08-29 DIAGNOSIS — Z466 Encounter for fitting and adjustment of urinary device: Secondary | ICD-10-CM | POA: Diagnosis not present

## 2017-08-29 DIAGNOSIS — G834 Cauda equina syndrome: Secondary | ICD-10-CM | POA: Diagnosis not present

## 2017-08-29 DIAGNOSIS — Z7982 Long term (current) use of aspirin: Secondary | ICD-10-CM | POA: Diagnosis not present

## 2017-08-29 DIAGNOSIS — Z791 Long term (current) use of non-steroidal anti-inflammatories (NSAID): Secondary | ICD-10-CM | POA: Diagnosis not present

## 2017-09-01 DIAGNOSIS — Z466 Encounter for fitting and adjustment of urinary device: Secondary | ICD-10-CM | POA: Diagnosis not present

## 2017-09-01 DIAGNOSIS — G8250 Quadriplegia, unspecified: Secondary | ICD-10-CM | POA: Diagnosis not present

## 2017-09-01 DIAGNOSIS — Z791 Long term (current) use of non-steroidal anti-inflammatories (NSAID): Secondary | ICD-10-CM | POA: Diagnosis not present

## 2017-09-01 DIAGNOSIS — Z7982 Long term (current) use of aspirin: Secondary | ICD-10-CM | POA: Diagnosis not present

## 2017-09-01 DIAGNOSIS — G834 Cauda equina syndrome: Secondary | ICD-10-CM | POA: Diagnosis not present

## 2017-09-05 DIAGNOSIS — G8250 Quadriplegia, unspecified: Secondary | ICD-10-CM | POA: Diagnosis not present

## 2017-09-05 DIAGNOSIS — Z466 Encounter for fitting and adjustment of urinary device: Secondary | ICD-10-CM | POA: Diagnosis not present

## 2017-09-05 DIAGNOSIS — G834 Cauda equina syndrome: Secondary | ICD-10-CM | POA: Diagnosis not present

## 2017-09-05 DIAGNOSIS — Z791 Long term (current) use of non-steroidal anti-inflammatories (NSAID): Secondary | ICD-10-CM | POA: Diagnosis not present

## 2017-09-05 DIAGNOSIS — Z7982 Long term (current) use of aspirin: Secondary | ICD-10-CM | POA: Diagnosis not present

## 2017-09-06 DIAGNOSIS — Z791 Long term (current) use of non-steroidal anti-inflammatories (NSAID): Secondary | ICD-10-CM | POA: Diagnosis not present

## 2017-09-06 DIAGNOSIS — Z466 Encounter for fitting and adjustment of urinary device: Secondary | ICD-10-CM | POA: Diagnosis not present

## 2017-09-06 DIAGNOSIS — Z7982 Long term (current) use of aspirin: Secondary | ICD-10-CM | POA: Diagnosis not present

## 2017-09-06 DIAGNOSIS — G8250 Quadriplegia, unspecified: Secondary | ICD-10-CM | POA: Diagnosis not present

## 2017-09-06 DIAGNOSIS — G834 Cauda equina syndrome: Secondary | ICD-10-CM | POA: Diagnosis not present

## 2017-09-07 DIAGNOSIS — L89159 Pressure ulcer of sacral region, unspecified stage: Secondary | ICD-10-CM | POA: Diagnosis not present

## 2017-09-07 DIAGNOSIS — G8250 Quadriplegia, unspecified: Secondary | ICD-10-CM | POA: Diagnosis not present

## 2017-09-07 DIAGNOSIS — Z993 Dependence on wheelchair: Secondary | ICD-10-CM | POA: Diagnosis not present

## 2017-09-08 DIAGNOSIS — Z791 Long term (current) use of non-steroidal anti-inflammatories (NSAID): Secondary | ICD-10-CM | POA: Diagnosis not present

## 2017-09-08 DIAGNOSIS — G834 Cauda equina syndrome: Secondary | ICD-10-CM | POA: Diagnosis not present

## 2017-09-08 DIAGNOSIS — Z466 Encounter for fitting and adjustment of urinary device: Secondary | ICD-10-CM | POA: Diagnosis not present

## 2017-09-08 DIAGNOSIS — Z7982 Long term (current) use of aspirin: Secondary | ICD-10-CM | POA: Diagnosis not present

## 2017-09-08 DIAGNOSIS — G8250 Quadriplegia, unspecified: Secondary | ICD-10-CM | POA: Diagnosis not present

## 2017-09-12 DIAGNOSIS — G8250 Quadriplegia, unspecified: Secondary | ICD-10-CM | POA: Diagnosis not present

## 2017-09-12 DIAGNOSIS — G834 Cauda equina syndrome: Secondary | ICD-10-CM | POA: Diagnosis not present

## 2017-09-12 DIAGNOSIS — Z466 Encounter for fitting and adjustment of urinary device: Secondary | ICD-10-CM | POA: Diagnosis not present

## 2017-09-12 DIAGNOSIS — Z791 Long term (current) use of non-steroidal anti-inflammatories (NSAID): Secondary | ICD-10-CM | POA: Diagnosis not present

## 2017-09-12 DIAGNOSIS — Z7982 Long term (current) use of aspirin: Secondary | ICD-10-CM | POA: Diagnosis not present

## 2017-09-15 DIAGNOSIS — Z7982 Long term (current) use of aspirin: Secondary | ICD-10-CM | POA: Diagnosis not present

## 2017-09-15 DIAGNOSIS — G8250 Quadriplegia, unspecified: Secondary | ICD-10-CM | POA: Diagnosis not present

## 2017-09-15 DIAGNOSIS — Z466 Encounter for fitting and adjustment of urinary device: Secondary | ICD-10-CM | POA: Diagnosis not present

## 2017-09-15 DIAGNOSIS — Z791 Long term (current) use of non-steroidal anti-inflammatories (NSAID): Secondary | ICD-10-CM | POA: Diagnosis not present

## 2017-09-15 DIAGNOSIS — G834 Cauda equina syndrome: Secondary | ICD-10-CM | POA: Diagnosis not present

## 2017-09-19 DIAGNOSIS — Z7982 Long term (current) use of aspirin: Secondary | ICD-10-CM | POA: Diagnosis not present

## 2017-09-19 DIAGNOSIS — G8250 Quadriplegia, unspecified: Secondary | ICD-10-CM | POA: Diagnosis not present

## 2017-09-19 DIAGNOSIS — G834 Cauda equina syndrome: Secondary | ICD-10-CM | POA: Diagnosis not present

## 2017-09-19 DIAGNOSIS — Z466 Encounter for fitting and adjustment of urinary device: Secondary | ICD-10-CM | POA: Diagnosis not present

## 2017-09-19 DIAGNOSIS — Z791 Long term (current) use of non-steroidal anti-inflammatories (NSAID): Secondary | ICD-10-CM | POA: Diagnosis not present

## 2017-09-22 DIAGNOSIS — Z466 Encounter for fitting and adjustment of urinary device: Secondary | ICD-10-CM | POA: Diagnosis not present

## 2017-09-22 DIAGNOSIS — G834 Cauda equina syndrome: Secondary | ICD-10-CM | POA: Diagnosis not present

## 2017-09-22 DIAGNOSIS — Z7982 Long term (current) use of aspirin: Secondary | ICD-10-CM | POA: Diagnosis not present

## 2017-09-22 DIAGNOSIS — G8250 Quadriplegia, unspecified: Secondary | ICD-10-CM | POA: Diagnosis not present

## 2017-09-22 DIAGNOSIS — Z791 Long term (current) use of non-steroidal anti-inflammatories (NSAID): Secondary | ICD-10-CM | POA: Diagnosis not present

## 2017-09-26 DIAGNOSIS — G834 Cauda equina syndrome: Secondary | ICD-10-CM | POA: Diagnosis not present

## 2017-09-26 DIAGNOSIS — G8250 Quadriplegia, unspecified: Secondary | ICD-10-CM | POA: Diagnosis not present

## 2017-09-26 DIAGNOSIS — Z466 Encounter for fitting and adjustment of urinary device: Secondary | ICD-10-CM | POA: Diagnosis not present

## 2017-09-26 DIAGNOSIS — Z791 Long term (current) use of non-steroidal anti-inflammatories (NSAID): Secondary | ICD-10-CM | POA: Diagnosis not present

## 2017-09-26 DIAGNOSIS — Z7982 Long term (current) use of aspirin: Secondary | ICD-10-CM | POA: Diagnosis not present

## 2017-09-28 DIAGNOSIS — L89159 Pressure ulcer of sacral region, unspecified stage: Secondary | ICD-10-CM | POA: Diagnosis not present

## 2017-09-28 DIAGNOSIS — Z993 Dependence on wheelchair: Secondary | ICD-10-CM | POA: Diagnosis not present

## 2017-09-28 DIAGNOSIS — Z7982 Long term (current) use of aspirin: Secondary | ICD-10-CM | POA: Diagnosis not present

## 2017-09-28 DIAGNOSIS — Z791 Long term (current) use of non-steroidal anti-inflammatories (NSAID): Secondary | ICD-10-CM | POA: Diagnosis not present

## 2017-09-28 DIAGNOSIS — Z466 Encounter for fitting and adjustment of urinary device: Secondary | ICD-10-CM | POA: Diagnosis not present

## 2017-09-28 DIAGNOSIS — G8250 Quadriplegia, unspecified: Secondary | ICD-10-CM | POA: Diagnosis not present

## 2017-09-28 DIAGNOSIS — G834 Cauda equina syndrome: Secondary | ICD-10-CM | POA: Diagnosis not present

## 2017-09-29 DIAGNOSIS — G834 Cauda equina syndrome: Secondary | ICD-10-CM | POA: Diagnosis not present

## 2017-09-29 DIAGNOSIS — Z791 Long term (current) use of non-steroidal anti-inflammatories (NSAID): Secondary | ICD-10-CM | POA: Diagnosis not present

## 2017-09-29 DIAGNOSIS — G8250 Quadriplegia, unspecified: Secondary | ICD-10-CM | POA: Diagnosis not present

## 2017-09-29 DIAGNOSIS — Z466 Encounter for fitting and adjustment of urinary device: Secondary | ICD-10-CM | POA: Diagnosis not present

## 2017-09-29 DIAGNOSIS — Z7982 Long term (current) use of aspirin: Secondary | ICD-10-CM | POA: Diagnosis not present

## 2017-10-03 ENCOUNTER — Encounter: Payer: Self-pay | Admitting: Radiology

## 2017-10-03 DIAGNOSIS — G8250 Quadriplegia, unspecified: Secondary | ICD-10-CM | POA: Diagnosis not present

## 2017-10-03 DIAGNOSIS — Z7982 Long term (current) use of aspirin: Secondary | ICD-10-CM | POA: Diagnosis not present

## 2017-10-03 DIAGNOSIS — Z791 Long term (current) use of non-steroidal anti-inflammatories (NSAID): Secondary | ICD-10-CM | POA: Diagnosis not present

## 2017-10-03 DIAGNOSIS — G834 Cauda equina syndrome: Secondary | ICD-10-CM | POA: Diagnosis not present

## 2017-10-03 DIAGNOSIS — Z466 Encounter for fitting and adjustment of urinary device: Secondary | ICD-10-CM | POA: Diagnosis not present

## 2017-10-06 DIAGNOSIS — Z466 Encounter for fitting and adjustment of urinary device: Secondary | ICD-10-CM | POA: Diagnosis not present

## 2017-10-06 DIAGNOSIS — Z7982 Long term (current) use of aspirin: Secondary | ICD-10-CM | POA: Diagnosis not present

## 2017-10-06 DIAGNOSIS — Z791 Long term (current) use of non-steroidal anti-inflammatories (NSAID): Secondary | ICD-10-CM | POA: Diagnosis not present

## 2017-10-06 DIAGNOSIS — G8250 Quadriplegia, unspecified: Secondary | ICD-10-CM | POA: Diagnosis not present

## 2017-10-06 DIAGNOSIS — G834 Cauda equina syndrome: Secondary | ICD-10-CM | POA: Diagnosis not present

## 2017-10-10 DIAGNOSIS — Z7982 Long term (current) use of aspirin: Secondary | ICD-10-CM | POA: Diagnosis not present

## 2017-10-10 DIAGNOSIS — Z791 Long term (current) use of non-steroidal anti-inflammatories (NSAID): Secondary | ICD-10-CM | POA: Diagnosis not present

## 2017-10-10 DIAGNOSIS — G8250 Quadriplegia, unspecified: Secondary | ICD-10-CM | POA: Diagnosis not present

## 2017-10-10 DIAGNOSIS — Z466 Encounter for fitting and adjustment of urinary device: Secondary | ICD-10-CM | POA: Diagnosis not present

## 2017-10-10 DIAGNOSIS — G834 Cauda equina syndrome: Secondary | ICD-10-CM | POA: Diagnosis not present

## 2017-10-12 DIAGNOSIS — Z466 Encounter for fitting and adjustment of urinary device: Secondary | ICD-10-CM | POA: Diagnosis not present

## 2017-10-12 DIAGNOSIS — Z791 Long term (current) use of non-steroidal anti-inflammatories (NSAID): Secondary | ICD-10-CM | POA: Diagnosis not present

## 2017-10-12 DIAGNOSIS — G834 Cauda equina syndrome: Secondary | ICD-10-CM | POA: Diagnosis not present

## 2017-10-12 DIAGNOSIS — Z7982 Long term (current) use of aspirin: Secondary | ICD-10-CM | POA: Diagnosis not present

## 2017-10-12 DIAGNOSIS — G8250 Quadriplegia, unspecified: Secondary | ICD-10-CM | POA: Diagnosis not present

## 2017-10-13 ENCOUNTER — Encounter: Payer: Self-pay | Admitting: Obstetrics and Gynecology

## 2017-10-18 DIAGNOSIS — Z7982 Long term (current) use of aspirin: Secondary | ICD-10-CM | POA: Diagnosis not present

## 2017-10-18 DIAGNOSIS — Z791 Long term (current) use of non-steroidal anti-inflammatories (NSAID): Secondary | ICD-10-CM | POA: Diagnosis not present

## 2017-10-18 DIAGNOSIS — Z466 Encounter for fitting and adjustment of urinary device: Secondary | ICD-10-CM | POA: Diagnosis not present

## 2017-10-18 DIAGNOSIS — G8250 Quadriplegia, unspecified: Secondary | ICD-10-CM | POA: Diagnosis not present

## 2017-10-18 DIAGNOSIS — G834 Cauda equina syndrome: Secondary | ICD-10-CM | POA: Diagnosis not present

## 2017-10-19 DIAGNOSIS — G834 Cauda equina syndrome: Secondary | ICD-10-CM | POA: Diagnosis not present

## 2017-10-19 DIAGNOSIS — G8250 Quadriplegia, unspecified: Secondary | ICD-10-CM | POA: Diagnosis not present

## 2017-10-19 DIAGNOSIS — Z466 Encounter for fitting and adjustment of urinary device: Secondary | ICD-10-CM | POA: Diagnosis not present

## 2017-10-19 DIAGNOSIS — Z7982 Long term (current) use of aspirin: Secondary | ICD-10-CM | POA: Diagnosis not present

## 2017-10-19 DIAGNOSIS — Z791 Long term (current) use of non-steroidal anti-inflammatories (NSAID): Secondary | ICD-10-CM | POA: Diagnosis not present

## 2017-10-19 NOTE — Progress Notes (Signed)
LAST PAP: 06/18/2005 Negative History of ASCUS: 2003

## 2017-10-20 ENCOUNTER — Encounter: Payer: Medicare Other | Admitting: Family Medicine

## 2017-10-20 ENCOUNTER — Telehealth: Payer: Self-pay | Admitting: Family Medicine

## 2017-10-20 DIAGNOSIS — Z7982 Long term (current) use of aspirin: Secondary | ICD-10-CM | POA: Diagnosis not present

## 2017-10-20 DIAGNOSIS — G834 Cauda equina syndrome: Secondary | ICD-10-CM | POA: Diagnosis not present

## 2017-10-20 DIAGNOSIS — Z466 Encounter for fitting and adjustment of urinary device: Secondary | ICD-10-CM | POA: Diagnosis not present

## 2017-10-20 DIAGNOSIS — G8250 Quadriplegia, unspecified: Secondary | ICD-10-CM | POA: Diagnosis not present

## 2017-10-20 DIAGNOSIS — Z791 Long term (current) use of non-steroidal anti-inflammatories (NSAID): Secondary | ICD-10-CM | POA: Diagnosis not present

## 2017-10-20 DIAGNOSIS — R6889 Other general symptoms and signs: Secondary | ICD-10-CM | POA: Diagnosis not present

## 2017-10-20 NOTE — Telephone Encounter (Signed)
Called patient to give her the appointment we had scheduled for her. Informed her that we would have to got to the third floor, and as long as there was not a patient in that room we would be able to see her. Patient declined to come to this office. She stated she had to go somewhere where she would be guaranteed to be seen on the day of her schedule.

## 2017-10-21 DIAGNOSIS — Z993 Dependence on wheelchair: Secondary | ICD-10-CM | POA: Diagnosis not present

## 2017-10-21 DIAGNOSIS — L89159 Pressure ulcer of sacral region, unspecified stage: Secondary | ICD-10-CM | POA: Diagnosis not present

## 2017-10-21 DIAGNOSIS — G8250 Quadriplegia, unspecified: Secondary | ICD-10-CM | POA: Diagnosis not present

## 2017-10-22 NOTE — Progress Notes (Signed)
  This encounter was created in error - please disregard. Patient arrived with need for lift--no one with her to assist, rescheduled at [facility] office for lift.

## 2017-10-24 DIAGNOSIS — Z791 Long term (current) use of non-steroidal anti-inflammatories (NSAID): Secondary | ICD-10-CM | POA: Diagnosis not present

## 2017-10-24 DIAGNOSIS — G8250 Quadriplegia, unspecified: Secondary | ICD-10-CM | POA: Diagnosis not present

## 2017-10-24 DIAGNOSIS — Z7982 Long term (current) use of aspirin: Secondary | ICD-10-CM | POA: Diagnosis not present

## 2017-10-24 DIAGNOSIS — Z466 Encounter for fitting and adjustment of urinary device: Secondary | ICD-10-CM | POA: Diagnosis not present

## 2017-10-24 DIAGNOSIS — G834 Cauda equina syndrome: Secondary | ICD-10-CM | POA: Diagnosis not present

## 2017-10-26 DIAGNOSIS — G8250 Quadriplegia, unspecified: Secondary | ICD-10-CM | POA: Diagnosis not present

## 2017-10-26 DIAGNOSIS — Z791 Long term (current) use of non-steroidal anti-inflammatories (NSAID): Secondary | ICD-10-CM | POA: Diagnosis not present

## 2017-10-26 DIAGNOSIS — G834 Cauda equina syndrome: Secondary | ICD-10-CM | POA: Diagnosis not present

## 2017-10-26 DIAGNOSIS — Z466 Encounter for fitting and adjustment of urinary device: Secondary | ICD-10-CM | POA: Diagnosis not present

## 2017-10-26 DIAGNOSIS — Z7982 Long term (current) use of aspirin: Secondary | ICD-10-CM | POA: Diagnosis not present

## 2017-10-27 DIAGNOSIS — Z466 Encounter for fitting and adjustment of urinary device: Secondary | ICD-10-CM | POA: Diagnosis not present

## 2017-10-27 DIAGNOSIS — Z791 Long term (current) use of non-steroidal anti-inflammatories (NSAID): Secondary | ICD-10-CM | POA: Diagnosis not present

## 2017-10-27 DIAGNOSIS — G8250 Quadriplegia, unspecified: Secondary | ICD-10-CM | POA: Diagnosis not present

## 2017-10-27 DIAGNOSIS — Z7982 Long term (current) use of aspirin: Secondary | ICD-10-CM | POA: Diagnosis not present

## 2017-10-27 DIAGNOSIS — G834 Cauda equina syndrome: Secondary | ICD-10-CM | POA: Diagnosis not present

## 2017-10-31 DIAGNOSIS — G834 Cauda equina syndrome: Secondary | ICD-10-CM | POA: Diagnosis not present

## 2017-10-31 DIAGNOSIS — G8250 Quadriplegia, unspecified: Secondary | ICD-10-CM | POA: Diagnosis not present

## 2017-10-31 DIAGNOSIS — Z466 Encounter for fitting and adjustment of urinary device: Secondary | ICD-10-CM | POA: Diagnosis not present

## 2017-10-31 DIAGNOSIS — Z791 Long term (current) use of non-steroidal anti-inflammatories (NSAID): Secondary | ICD-10-CM | POA: Diagnosis not present

## 2017-10-31 DIAGNOSIS — Z7982 Long term (current) use of aspirin: Secondary | ICD-10-CM | POA: Diagnosis not present

## 2017-11-03 DIAGNOSIS — Z466 Encounter for fitting and adjustment of urinary device: Secondary | ICD-10-CM | POA: Diagnosis not present

## 2017-11-03 DIAGNOSIS — Z7982 Long term (current) use of aspirin: Secondary | ICD-10-CM | POA: Diagnosis not present

## 2017-11-03 DIAGNOSIS — G8250 Quadriplegia, unspecified: Secondary | ICD-10-CM | POA: Diagnosis not present

## 2017-11-03 DIAGNOSIS — Z791 Long term (current) use of non-steroidal anti-inflammatories (NSAID): Secondary | ICD-10-CM | POA: Diagnosis not present

## 2017-11-03 DIAGNOSIS — G834 Cauda equina syndrome: Secondary | ICD-10-CM | POA: Diagnosis not present

## 2017-11-07 DIAGNOSIS — Z466 Encounter for fitting and adjustment of urinary device: Secondary | ICD-10-CM | POA: Diagnosis not present

## 2017-11-07 DIAGNOSIS — Z791 Long term (current) use of non-steroidal anti-inflammatories (NSAID): Secondary | ICD-10-CM | POA: Diagnosis not present

## 2017-11-07 DIAGNOSIS — G834 Cauda equina syndrome: Secondary | ICD-10-CM | POA: Diagnosis not present

## 2017-11-07 DIAGNOSIS — Z7982 Long term (current) use of aspirin: Secondary | ICD-10-CM | POA: Diagnosis not present

## 2017-11-07 DIAGNOSIS — G8250 Quadriplegia, unspecified: Secondary | ICD-10-CM | POA: Diagnosis not present

## 2017-11-10 DIAGNOSIS — Z466 Encounter for fitting and adjustment of urinary device: Secondary | ICD-10-CM | POA: Diagnosis not present

## 2017-11-10 DIAGNOSIS — Z7982 Long term (current) use of aspirin: Secondary | ICD-10-CM | POA: Diagnosis not present

## 2017-11-10 DIAGNOSIS — Z791 Long term (current) use of non-steroidal anti-inflammatories (NSAID): Secondary | ICD-10-CM | POA: Diagnosis not present

## 2017-11-10 DIAGNOSIS — G834 Cauda equina syndrome: Secondary | ICD-10-CM | POA: Diagnosis not present

## 2017-11-10 DIAGNOSIS — G8250 Quadriplegia, unspecified: Secondary | ICD-10-CM | POA: Diagnosis not present

## 2017-11-12 DIAGNOSIS — L89159 Pressure ulcer of sacral region, unspecified stage: Secondary | ICD-10-CM | POA: Diagnosis not present

## 2017-11-12 DIAGNOSIS — G8250 Quadriplegia, unspecified: Secondary | ICD-10-CM | POA: Diagnosis not present

## 2017-11-12 DIAGNOSIS — Z993 Dependence on wheelchair: Secondary | ICD-10-CM | POA: Diagnosis not present

## 2017-11-14 DIAGNOSIS — G8250 Quadriplegia, unspecified: Secondary | ICD-10-CM | POA: Diagnosis not present

## 2017-11-14 DIAGNOSIS — Z791 Long term (current) use of non-steroidal anti-inflammatories (NSAID): Secondary | ICD-10-CM | POA: Diagnosis not present

## 2017-11-14 DIAGNOSIS — Z7982 Long term (current) use of aspirin: Secondary | ICD-10-CM | POA: Diagnosis not present

## 2017-11-14 DIAGNOSIS — Z466 Encounter for fitting and adjustment of urinary device: Secondary | ICD-10-CM | POA: Diagnosis not present

## 2017-11-14 DIAGNOSIS — G834 Cauda equina syndrome: Secondary | ICD-10-CM | POA: Diagnosis not present

## 2017-11-15 DIAGNOSIS — G834 Cauda equina syndrome: Secondary | ICD-10-CM | POA: Diagnosis not present

## 2017-11-15 DIAGNOSIS — Z466 Encounter for fitting and adjustment of urinary device: Secondary | ICD-10-CM | POA: Diagnosis not present

## 2017-11-15 DIAGNOSIS — Z7982 Long term (current) use of aspirin: Secondary | ICD-10-CM | POA: Diagnosis not present

## 2017-11-15 DIAGNOSIS — G8250 Quadriplegia, unspecified: Secondary | ICD-10-CM | POA: Diagnosis not present

## 2017-11-15 DIAGNOSIS — Z791 Long term (current) use of non-steroidal anti-inflammatories (NSAID): Secondary | ICD-10-CM | POA: Diagnosis not present

## 2017-11-17 DIAGNOSIS — G834 Cauda equina syndrome: Secondary | ICD-10-CM | POA: Diagnosis not present

## 2017-11-17 DIAGNOSIS — G8250 Quadriplegia, unspecified: Secondary | ICD-10-CM | POA: Diagnosis not present

## 2017-11-17 DIAGNOSIS — Z791 Long term (current) use of non-steroidal anti-inflammatories (NSAID): Secondary | ICD-10-CM | POA: Diagnosis not present

## 2017-11-17 DIAGNOSIS — Z7982 Long term (current) use of aspirin: Secondary | ICD-10-CM | POA: Diagnosis not present

## 2017-11-17 DIAGNOSIS — Z466 Encounter for fitting and adjustment of urinary device: Secondary | ICD-10-CM | POA: Diagnosis not present

## 2017-11-19 DIAGNOSIS — L89159 Pressure ulcer of sacral region, unspecified stage: Secondary | ICD-10-CM | POA: Diagnosis not present

## 2017-11-19 DIAGNOSIS — Z993 Dependence on wheelchair: Secondary | ICD-10-CM | POA: Diagnosis not present

## 2017-11-19 DIAGNOSIS — G8250 Quadriplegia, unspecified: Secondary | ICD-10-CM | POA: Diagnosis not present

## 2017-11-21 DIAGNOSIS — Z466 Encounter for fitting and adjustment of urinary device: Secondary | ICD-10-CM | POA: Diagnosis not present

## 2017-11-21 DIAGNOSIS — G834 Cauda equina syndrome: Secondary | ICD-10-CM | POA: Diagnosis not present

## 2017-11-21 DIAGNOSIS — Z7982 Long term (current) use of aspirin: Secondary | ICD-10-CM | POA: Diagnosis not present

## 2017-11-21 DIAGNOSIS — Z791 Long term (current) use of non-steroidal anti-inflammatories (NSAID): Secondary | ICD-10-CM | POA: Diagnosis not present

## 2017-11-21 DIAGNOSIS — G8250 Quadriplegia, unspecified: Secondary | ICD-10-CM | POA: Diagnosis not present

## 2017-11-24 DIAGNOSIS — Z7982 Long term (current) use of aspirin: Secondary | ICD-10-CM | POA: Diagnosis not present

## 2017-11-24 DIAGNOSIS — G834 Cauda equina syndrome: Secondary | ICD-10-CM | POA: Diagnosis not present

## 2017-11-24 DIAGNOSIS — Z466 Encounter for fitting and adjustment of urinary device: Secondary | ICD-10-CM | POA: Diagnosis not present

## 2017-11-24 DIAGNOSIS — G8250 Quadriplegia, unspecified: Secondary | ICD-10-CM | POA: Diagnosis not present

## 2017-11-24 DIAGNOSIS — Z791 Long term (current) use of non-steroidal anti-inflammatories (NSAID): Secondary | ICD-10-CM | POA: Diagnosis not present

## 2017-11-28 DIAGNOSIS — Z791 Long term (current) use of non-steroidal anti-inflammatories (NSAID): Secondary | ICD-10-CM | POA: Diagnosis not present

## 2017-11-28 DIAGNOSIS — Z7982 Long term (current) use of aspirin: Secondary | ICD-10-CM | POA: Diagnosis not present

## 2017-11-28 DIAGNOSIS — G834 Cauda equina syndrome: Secondary | ICD-10-CM | POA: Diagnosis not present

## 2017-11-28 DIAGNOSIS — G8250 Quadriplegia, unspecified: Secondary | ICD-10-CM | POA: Diagnosis not present

## 2017-11-28 DIAGNOSIS — Z466 Encounter for fitting and adjustment of urinary device: Secondary | ICD-10-CM | POA: Diagnosis not present

## 2017-12-01 DIAGNOSIS — G8250 Quadriplegia, unspecified: Secondary | ICD-10-CM | POA: Diagnosis not present

## 2017-12-01 DIAGNOSIS — Z791 Long term (current) use of non-steroidal anti-inflammatories (NSAID): Secondary | ICD-10-CM | POA: Diagnosis not present

## 2017-12-01 DIAGNOSIS — Z7982 Long term (current) use of aspirin: Secondary | ICD-10-CM | POA: Diagnosis not present

## 2017-12-01 DIAGNOSIS — Z466 Encounter for fitting and adjustment of urinary device: Secondary | ICD-10-CM | POA: Diagnosis not present

## 2017-12-01 DIAGNOSIS — G834 Cauda equina syndrome: Secondary | ICD-10-CM | POA: Diagnosis not present

## 2017-12-02 ENCOUNTER — Encounter: Payer: Self-pay | Admitting: Family Medicine

## 2017-12-05 DIAGNOSIS — G834 Cauda equina syndrome: Secondary | ICD-10-CM | POA: Diagnosis not present

## 2017-12-05 DIAGNOSIS — Z7982 Long term (current) use of aspirin: Secondary | ICD-10-CM | POA: Diagnosis not present

## 2017-12-05 DIAGNOSIS — G8250 Quadriplegia, unspecified: Secondary | ICD-10-CM | POA: Diagnosis not present

## 2017-12-05 DIAGNOSIS — Z466 Encounter for fitting and adjustment of urinary device: Secondary | ICD-10-CM | POA: Diagnosis not present

## 2017-12-05 DIAGNOSIS — Z791 Long term (current) use of non-steroidal anti-inflammatories (NSAID): Secondary | ICD-10-CM | POA: Diagnosis not present

## 2017-12-06 DIAGNOSIS — G834 Cauda equina syndrome: Secondary | ICD-10-CM | POA: Diagnosis not present

## 2017-12-06 DIAGNOSIS — G8250 Quadriplegia, unspecified: Secondary | ICD-10-CM | POA: Diagnosis not present

## 2017-12-06 DIAGNOSIS — Z7982 Long term (current) use of aspirin: Secondary | ICD-10-CM | POA: Diagnosis not present

## 2017-12-06 DIAGNOSIS — Z791 Long term (current) use of non-steroidal anti-inflammatories (NSAID): Secondary | ICD-10-CM | POA: Diagnosis not present

## 2017-12-06 DIAGNOSIS — Z466 Encounter for fitting and adjustment of urinary device: Secondary | ICD-10-CM | POA: Diagnosis not present

## 2017-12-08 DIAGNOSIS — Z7982 Long term (current) use of aspirin: Secondary | ICD-10-CM | POA: Diagnosis not present

## 2017-12-08 DIAGNOSIS — L89159 Pressure ulcer of sacral region, unspecified stage: Secondary | ICD-10-CM | POA: Diagnosis not present

## 2017-12-08 DIAGNOSIS — Z791 Long term (current) use of non-steroidal anti-inflammatories (NSAID): Secondary | ICD-10-CM | POA: Diagnosis not present

## 2017-12-08 DIAGNOSIS — G8250 Quadriplegia, unspecified: Secondary | ICD-10-CM | POA: Diagnosis not present

## 2017-12-08 DIAGNOSIS — Z993 Dependence on wheelchair: Secondary | ICD-10-CM | POA: Diagnosis not present

## 2017-12-08 DIAGNOSIS — Z466 Encounter for fitting and adjustment of urinary device: Secondary | ICD-10-CM | POA: Diagnosis not present

## 2017-12-08 DIAGNOSIS — G834 Cauda equina syndrome: Secondary | ICD-10-CM | POA: Diagnosis not present

## 2017-12-12 DIAGNOSIS — Z7982 Long term (current) use of aspirin: Secondary | ICD-10-CM | POA: Diagnosis not present

## 2017-12-12 DIAGNOSIS — G834 Cauda equina syndrome: Secondary | ICD-10-CM | POA: Diagnosis not present

## 2017-12-12 DIAGNOSIS — Z466 Encounter for fitting and adjustment of urinary device: Secondary | ICD-10-CM | POA: Diagnosis not present

## 2017-12-12 DIAGNOSIS — Z791 Long term (current) use of non-steroidal anti-inflammatories (NSAID): Secondary | ICD-10-CM | POA: Diagnosis not present

## 2017-12-12 DIAGNOSIS — G8250 Quadriplegia, unspecified: Secondary | ICD-10-CM | POA: Diagnosis not present

## 2017-12-15 DIAGNOSIS — Z882 Allergy status to sulfonamides status: Secondary | ICD-10-CM | POA: Diagnosis not present

## 2017-12-15 DIAGNOSIS — R3911 Hesitancy of micturition: Secondary | ICD-10-CM | POA: Diagnosis not present

## 2017-12-15 DIAGNOSIS — R6889 Other general symptoms and signs: Secondary | ICD-10-CM | POA: Diagnosis not present

## 2017-12-15 DIAGNOSIS — G834 Cauda equina syndrome: Secondary | ICD-10-CM | POA: Diagnosis not present

## 2017-12-15 DIAGNOSIS — Z23 Encounter for immunization: Secondary | ICD-10-CM | POA: Diagnosis not present

## 2017-12-15 DIAGNOSIS — Z0000 Encounter for general adult medical examination without abnormal findings: Secondary | ICD-10-CM | POA: Diagnosis not present

## 2017-12-15 DIAGNOSIS — G8250 Quadriplegia, unspecified: Secondary | ICD-10-CM | POA: Diagnosis not present

## 2017-12-15 DIAGNOSIS — Z7982 Long term (current) use of aspirin: Secondary | ICD-10-CM | POA: Diagnosis not present

## 2017-12-15 DIAGNOSIS — Z791 Long term (current) use of non-steroidal anti-inflammatories (NSAID): Secondary | ICD-10-CM | POA: Diagnosis not present

## 2017-12-15 DIAGNOSIS — K029 Dental caries, unspecified: Secondary | ICD-10-CM | POA: Diagnosis not present

## 2017-12-15 DIAGNOSIS — Z466 Encounter for fitting and adjustment of urinary device: Secondary | ICD-10-CM | POA: Diagnosis not present

## 2017-12-19 DIAGNOSIS — Z7982 Long term (current) use of aspirin: Secondary | ICD-10-CM | POA: Diagnosis not present

## 2017-12-19 DIAGNOSIS — Z466 Encounter for fitting and adjustment of urinary device: Secondary | ICD-10-CM | POA: Diagnosis not present

## 2017-12-19 DIAGNOSIS — Z791 Long term (current) use of non-steroidal anti-inflammatories (NSAID): Secondary | ICD-10-CM | POA: Diagnosis not present

## 2017-12-19 DIAGNOSIS — G834 Cauda equina syndrome: Secondary | ICD-10-CM | POA: Diagnosis not present

## 2017-12-19 DIAGNOSIS — G8250 Quadriplegia, unspecified: Secondary | ICD-10-CM | POA: Diagnosis not present

## 2017-12-22 DIAGNOSIS — Z791 Long term (current) use of non-steroidal anti-inflammatories (NSAID): Secondary | ICD-10-CM | POA: Diagnosis not present

## 2017-12-22 DIAGNOSIS — G8250 Quadriplegia, unspecified: Secondary | ICD-10-CM | POA: Diagnosis not present

## 2017-12-22 DIAGNOSIS — G834 Cauda equina syndrome: Secondary | ICD-10-CM | POA: Diagnosis not present

## 2017-12-22 DIAGNOSIS — Z7982 Long term (current) use of aspirin: Secondary | ICD-10-CM | POA: Diagnosis not present

## 2017-12-22 DIAGNOSIS — L89159 Pressure ulcer of sacral region, unspecified stage: Secondary | ICD-10-CM | POA: Diagnosis not present

## 2017-12-22 DIAGNOSIS — Z466 Encounter for fitting and adjustment of urinary device: Secondary | ICD-10-CM | POA: Diagnosis not present

## 2017-12-22 DIAGNOSIS — Z993 Dependence on wheelchair: Secondary | ICD-10-CM | POA: Diagnosis not present

## 2017-12-26 DIAGNOSIS — Z791 Long term (current) use of non-steroidal anti-inflammatories (NSAID): Secondary | ICD-10-CM | POA: Diagnosis not present

## 2017-12-26 DIAGNOSIS — Z7982 Long term (current) use of aspirin: Secondary | ICD-10-CM | POA: Diagnosis not present

## 2017-12-26 DIAGNOSIS — Z466 Encounter for fitting and adjustment of urinary device: Secondary | ICD-10-CM | POA: Diagnosis not present

## 2017-12-26 DIAGNOSIS — G834 Cauda equina syndrome: Secondary | ICD-10-CM | POA: Diagnosis not present

## 2017-12-26 DIAGNOSIS — G8250 Quadriplegia, unspecified: Secondary | ICD-10-CM | POA: Diagnosis not present

## 2017-12-27 DIAGNOSIS — Z466 Encounter for fitting and adjustment of urinary device: Secondary | ICD-10-CM | POA: Diagnosis not present

## 2017-12-27 DIAGNOSIS — Z7982 Long term (current) use of aspirin: Secondary | ICD-10-CM | POA: Diagnosis not present

## 2017-12-27 DIAGNOSIS — G834 Cauda equina syndrome: Secondary | ICD-10-CM | POA: Diagnosis not present

## 2017-12-27 DIAGNOSIS — Z791 Long term (current) use of non-steroidal anti-inflammatories (NSAID): Secondary | ICD-10-CM | POA: Diagnosis not present

## 2017-12-27 DIAGNOSIS — G8250 Quadriplegia, unspecified: Secondary | ICD-10-CM | POA: Diagnosis not present

## 2017-12-29 DIAGNOSIS — Z466 Encounter for fitting and adjustment of urinary device: Secondary | ICD-10-CM | POA: Diagnosis not present

## 2017-12-29 DIAGNOSIS — Z7982 Long term (current) use of aspirin: Secondary | ICD-10-CM | POA: Diagnosis not present

## 2017-12-29 DIAGNOSIS — G8250 Quadriplegia, unspecified: Secondary | ICD-10-CM | POA: Diagnosis not present

## 2017-12-29 DIAGNOSIS — Z791 Long term (current) use of non-steroidal anti-inflammatories (NSAID): Secondary | ICD-10-CM | POA: Diagnosis not present

## 2017-12-29 DIAGNOSIS — G834 Cauda equina syndrome: Secondary | ICD-10-CM | POA: Diagnosis not present

## 2018-01-02 DIAGNOSIS — Z791 Long term (current) use of non-steroidal anti-inflammatories (NSAID): Secondary | ICD-10-CM | POA: Diagnosis not present

## 2018-01-02 DIAGNOSIS — G8250 Quadriplegia, unspecified: Secondary | ICD-10-CM | POA: Diagnosis not present

## 2018-01-02 DIAGNOSIS — Z7982 Long term (current) use of aspirin: Secondary | ICD-10-CM | POA: Diagnosis not present

## 2018-01-02 DIAGNOSIS — G834 Cauda equina syndrome: Secondary | ICD-10-CM | POA: Diagnosis not present

## 2018-01-02 DIAGNOSIS — Z466 Encounter for fitting and adjustment of urinary device: Secondary | ICD-10-CM | POA: Diagnosis not present

## 2018-01-04 DIAGNOSIS — L89159 Pressure ulcer of sacral region, unspecified stage: Secondary | ICD-10-CM | POA: Diagnosis not present

## 2018-01-04 DIAGNOSIS — G8250 Quadriplegia, unspecified: Secondary | ICD-10-CM | POA: Diagnosis not present

## 2018-01-04 DIAGNOSIS — Z993 Dependence on wheelchair: Secondary | ICD-10-CM | POA: Diagnosis not present

## 2018-01-05 DIAGNOSIS — Z791 Long term (current) use of non-steroidal anti-inflammatories (NSAID): Secondary | ICD-10-CM | POA: Diagnosis not present

## 2018-01-05 DIAGNOSIS — Z466 Encounter for fitting and adjustment of urinary device: Secondary | ICD-10-CM | POA: Diagnosis not present

## 2018-01-05 DIAGNOSIS — Z7982 Long term (current) use of aspirin: Secondary | ICD-10-CM | POA: Diagnosis not present

## 2018-01-05 DIAGNOSIS — G8250 Quadriplegia, unspecified: Secondary | ICD-10-CM | POA: Diagnosis not present

## 2018-01-05 DIAGNOSIS — G834 Cauda equina syndrome: Secondary | ICD-10-CM | POA: Diagnosis not present

## 2018-01-09 DIAGNOSIS — Z791 Long term (current) use of non-steroidal anti-inflammatories (NSAID): Secondary | ICD-10-CM | POA: Diagnosis not present

## 2018-01-09 DIAGNOSIS — Z466 Encounter for fitting and adjustment of urinary device: Secondary | ICD-10-CM | POA: Diagnosis not present

## 2018-01-09 DIAGNOSIS — Z7982 Long term (current) use of aspirin: Secondary | ICD-10-CM | POA: Diagnosis not present

## 2018-01-09 DIAGNOSIS — G834 Cauda equina syndrome: Secondary | ICD-10-CM | POA: Diagnosis not present

## 2018-01-09 DIAGNOSIS — G8250 Quadriplegia, unspecified: Secondary | ICD-10-CM | POA: Diagnosis not present

## 2018-01-12 DIAGNOSIS — Z466 Encounter for fitting and adjustment of urinary device: Secondary | ICD-10-CM | POA: Diagnosis not present

## 2018-01-12 DIAGNOSIS — Z7982 Long term (current) use of aspirin: Secondary | ICD-10-CM | POA: Diagnosis not present

## 2018-01-12 DIAGNOSIS — Z791 Long term (current) use of non-steroidal anti-inflammatories (NSAID): Secondary | ICD-10-CM | POA: Diagnosis not present

## 2018-01-12 DIAGNOSIS — G834 Cauda equina syndrome: Secondary | ICD-10-CM | POA: Diagnosis not present

## 2018-01-12 DIAGNOSIS — G8250 Quadriplegia, unspecified: Secondary | ICD-10-CM | POA: Diagnosis not present

## 2018-01-16 DIAGNOSIS — Z466 Encounter for fitting and adjustment of urinary device: Secondary | ICD-10-CM | POA: Diagnosis not present

## 2018-01-16 DIAGNOSIS — Z791 Long term (current) use of non-steroidal anti-inflammatories (NSAID): Secondary | ICD-10-CM | POA: Diagnosis not present

## 2018-01-16 DIAGNOSIS — G834 Cauda equina syndrome: Secondary | ICD-10-CM | POA: Diagnosis not present

## 2018-01-16 DIAGNOSIS — G8250 Quadriplegia, unspecified: Secondary | ICD-10-CM | POA: Diagnosis not present

## 2018-01-16 DIAGNOSIS — Z7982 Long term (current) use of aspirin: Secondary | ICD-10-CM | POA: Diagnosis not present

## 2018-01-18 DIAGNOSIS — Z7982 Long term (current) use of aspirin: Secondary | ICD-10-CM | POA: Diagnosis not present

## 2018-01-18 DIAGNOSIS — G834 Cauda equina syndrome: Secondary | ICD-10-CM | POA: Diagnosis not present

## 2018-01-18 DIAGNOSIS — G8250 Quadriplegia, unspecified: Secondary | ICD-10-CM | POA: Diagnosis not present

## 2018-01-18 DIAGNOSIS — Z791 Long term (current) use of non-steroidal anti-inflammatories (NSAID): Secondary | ICD-10-CM | POA: Diagnosis not present

## 2018-01-18 DIAGNOSIS — Z466 Encounter for fitting and adjustment of urinary device: Secondary | ICD-10-CM | POA: Diagnosis not present

## 2018-01-19 DIAGNOSIS — Z466 Encounter for fitting and adjustment of urinary device: Secondary | ICD-10-CM | POA: Diagnosis not present

## 2018-01-19 DIAGNOSIS — Z7982 Long term (current) use of aspirin: Secondary | ICD-10-CM | POA: Diagnosis not present

## 2018-01-19 DIAGNOSIS — Z791 Long term (current) use of non-steroidal anti-inflammatories (NSAID): Secondary | ICD-10-CM | POA: Diagnosis not present

## 2018-01-19 DIAGNOSIS — G834 Cauda equina syndrome: Secondary | ICD-10-CM | POA: Diagnosis not present

## 2018-01-19 DIAGNOSIS — G8250 Quadriplegia, unspecified: Secondary | ICD-10-CM | POA: Diagnosis not present

## 2018-01-20 DIAGNOSIS — Z780 Asymptomatic menopausal state: Secondary | ICD-10-CM | POA: Diagnosis not present

## 2018-01-20 DIAGNOSIS — N950 Postmenopausal bleeding: Secondary | ICD-10-CM | POA: Diagnosis not present

## 2018-01-20 DIAGNOSIS — R6889 Other general symptoms and signs: Secondary | ICD-10-CM | POA: Diagnosis not present

## 2018-01-20 DIAGNOSIS — N319 Neuromuscular dysfunction of bladder, unspecified: Secondary | ICD-10-CM | POA: Diagnosis not present

## 2018-01-20 DIAGNOSIS — Z960 Presence of urogenital implants: Secondary | ICD-10-CM | POA: Diagnosis not present

## 2018-01-23 DIAGNOSIS — G8250 Quadriplegia, unspecified: Secondary | ICD-10-CM | POA: Diagnosis not present

## 2018-01-23 DIAGNOSIS — Z466 Encounter for fitting and adjustment of urinary device: Secondary | ICD-10-CM | POA: Diagnosis not present

## 2018-01-23 DIAGNOSIS — Z791 Long term (current) use of non-steroidal anti-inflammatories (NSAID): Secondary | ICD-10-CM | POA: Diagnosis not present

## 2018-01-23 DIAGNOSIS — Z7982 Long term (current) use of aspirin: Secondary | ICD-10-CM | POA: Diagnosis not present

## 2018-01-23 DIAGNOSIS — G834 Cauda equina syndrome: Secondary | ICD-10-CM | POA: Diagnosis not present

## 2018-01-24 DIAGNOSIS — G834 Cauda equina syndrome: Secondary | ICD-10-CM | POA: Diagnosis not present

## 2018-01-24 DIAGNOSIS — G8250 Quadriplegia, unspecified: Secondary | ICD-10-CM | POA: Diagnosis not present

## 2018-01-24 DIAGNOSIS — Z791 Long term (current) use of non-steroidal anti-inflammatories (NSAID): Secondary | ICD-10-CM | POA: Diagnosis not present

## 2018-01-24 DIAGNOSIS — Z7982 Long term (current) use of aspirin: Secondary | ICD-10-CM | POA: Diagnosis not present

## 2018-01-24 DIAGNOSIS — Z466 Encounter for fitting and adjustment of urinary device: Secondary | ICD-10-CM | POA: Diagnosis not present

## 2018-01-25 DIAGNOSIS — C541 Malignant neoplasm of endometrium: Secondary | ICD-10-CM | POA: Insufficient documentation

## 2018-01-25 HISTORY — DX: Malignant neoplasm of endometrium: C54.1

## 2018-01-26 DIAGNOSIS — G834 Cauda equina syndrome: Secondary | ICD-10-CM | POA: Diagnosis not present

## 2018-01-26 DIAGNOSIS — Z7982 Long term (current) use of aspirin: Secondary | ICD-10-CM | POA: Diagnosis not present

## 2018-01-26 DIAGNOSIS — Z466 Encounter for fitting and adjustment of urinary device: Secondary | ICD-10-CM | POA: Diagnosis not present

## 2018-01-26 DIAGNOSIS — G8250 Quadriplegia, unspecified: Secondary | ICD-10-CM | POA: Diagnosis not present

## 2018-01-26 DIAGNOSIS — Z791 Long term (current) use of non-steroidal anti-inflammatories (NSAID): Secondary | ICD-10-CM | POA: Diagnosis not present

## 2018-01-27 DIAGNOSIS — R9389 Abnormal findings on diagnostic imaging of other specified body structures: Secondary | ICD-10-CM | POA: Diagnosis not present

## 2018-01-27 DIAGNOSIS — N950 Postmenopausal bleeding: Secondary | ICD-10-CM | POA: Diagnosis not present

## 2018-01-27 DIAGNOSIS — R6889 Other general symptoms and signs: Secondary | ICD-10-CM | POA: Diagnosis not present

## 2018-01-30 DIAGNOSIS — G834 Cauda equina syndrome: Secondary | ICD-10-CM | POA: Diagnosis not present

## 2018-01-30 DIAGNOSIS — Z791 Long term (current) use of non-steroidal anti-inflammatories (NSAID): Secondary | ICD-10-CM | POA: Diagnosis not present

## 2018-01-30 DIAGNOSIS — G8250 Quadriplegia, unspecified: Secondary | ICD-10-CM | POA: Diagnosis not present

## 2018-01-30 DIAGNOSIS — Z466 Encounter for fitting and adjustment of urinary device: Secondary | ICD-10-CM | POA: Diagnosis not present

## 2018-01-30 DIAGNOSIS — Z7982 Long term (current) use of aspirin: Secondary | ICD-10-CM | POA: Diagnosis not present

## 2018-02-01 DIAGNOSIS — G8250 Quadriplegia, unspecified: Secondary | ICD-10-CM | POA: Diagnosis not present

## 2018-02-01 DIAGNOSIS — Z993 Dependence on wheelchair: Secondary | ICD-10-CM | POA: Diagnosis not present

## 2018-02-01 DIAGNOSIS — L89159 Pressure ulcer of sacral region, unspecified stage: Secondary | ICD-10-CM | POA: Diagnosis not present

## 2018-02-03 DIAGNOSIS — Z791 Long term (current) use of non-steroidal anti-inflammatories (NSAID): Secondary | ICD-10-CM | POA: Diagnosis not present

## 2018-02-03 DIAGNOSIS — G834 Cauda equina syndrome: Secondary | ICD-10-CM | POA: Diagnosis not present

## 2018-02-03 DIAGNOSIS — Z466 Encounter for fitting and adjustment of urinary device: Secondary | ICD-10-CM | POA: Diagnosis not present

## 2018-02-03 DIAGNOSIS — G8250 Quadriplegia, unspecified: Secondary | ICD-10-CM | POA: Diagnosis not present

## 2018-02-03 DIAGNOSIS — Z7982 Long term (current) use of aspirin: Secondary | ICD-10-CM | POA: Diagnosis not present

## 2018-02-04 DIAGNOSIS — N83201 Unspecified ovarian cyst, right side: Secondary | ICD-10-CM | POA: Insufficient documentation

## 2018-02-06 DIAGNOSIS — G8250 Quadriplegia, unspecified: Secondary | ICD-10-CM | POA: Diagnosis not present

## 2018-02-06 DIAGNOSIS — G834 Cauda equina syndrome: Secondary | ICD-10-CM | POA: Diagnosis not present

## 2018-02-06 DIAGNOSIS — Z7982 Long term (current) use of aspirin: Secondary | ICD-10-CM | POA: Diagnosis not present

## 2018-02-06 DIAGNOSIS — Z466 Encounter for fitting and adjustment of urinary device: Secondary | ICD-10-CM | POA: Diagnosis not present

## 2018-02-06 DIAGNOSIS — Z791 Long term (current) use of non-steroidal anti-inflammatories (NSAID): Secondary | ICD-10-CM | POA: Diagnosis not present

## 2018-02-07 DIAGNOSIS — Z466 Encounter for fitting and adjustment of urinary device: Secondary | ICD-10-CM | POA: Diagnosis not present

## 2018-02-07 DIAGNOSIS — Z7982 Long term (current) use of aspirin: Secondary | ICD-10-CM | POA: Diagnosis not present

## 2018-02-07 DIAGNOSIS — G8250 Quadriplegia, unspecified: Secondary | ICD-10-CM | POA: Diagnosis not present

## 2018-02-07 DIAGNOSIS — G834 Cauda equina syndrome: Secondary | ICD-10-CM | POA: Diagnosis not present

## 2018-02-07 DIAGNOSIS — Z791 Long term (current) use of non-steroidal anti-inflammatories (NSAID): Secondary | ICD-10-CM | POA: Diagnosis not present

## 2018-02-09 DIAGNOSIS — G834 Cauda equina syndrome: Secondary | ICD-10-CM | POA: Diagnosis not present

## 2018-02-09 DIAGNOSIS — G8250 Quadriplegia, unspecified: Secondary | ICD-10-CM | POA: Diagnosis not present

## 2018-02-09 DIAGNOSIS — Z7982 Long term (current) use of aspirin: Secondary | ICD-10-CM | POA: Diagnosis not present

## 2018-02-09 DIAGNOSIS — Z466 Encounter for fitting and adjustment of urinary device: Secondary | ICD-10-CM | POA: Diagnosis not present

## 2018-02-09 DIAGNOSIS — Z791 Long term (current) use of non-steroidal anti-inflammatories (NSAID): Secondary | ICD-10-CM | POA: Diagnosis not present

## 2018-02-10 DIAGNOSIS — G8250 Quadriplegia, unspecified: Secondary | ICD-10-CM | POA: Diagnosis not present

## 2018-02-10 DIAGNOSIS — G834 Cauda equina syndrome: Secondary | ICD-10-CM | POA: Diagnosis not present

## 2018-02-10 DIAGNOSIS — Z791 Long term (current) use of non-steroidal anti-inflammatories (NSAID): Secondary | ICD-10-CM | POA: Diagnosis not present

## 2018-02-10 DIAGNOSIS — Z466 Encounter for fitting and adjustment of urinary device: Secondary | ICD-10-CM | POA: Diagnosis not present

## 2018-02-10 DIAGNOSIS — Z7982 Long term (current) use of aspirin: Secondary | ICD-10-CM | POA: Diagnosis not present

## 2018-02-13 DIAGNOSIS — G8250 Quadriplegia, unspecified: Secondary | ICD-10-CM | POA: Diagnosis not present

## 2018-02-13 DIAGNOSIS — Z466 Encounter for fitting and adjustment of urinary device: Secondary | ICD-10-CM | POA: Diagnosis not present

## 2018-02-13 DIAGNOSIS — Z791 Long term (current) use of non-steroidal anti-inflammatories (NSAID): Secondary | ICD-10-CM | POA: Diagnosis not present

## 2018-02-13 DIAGNOSIS — Z7982 Long term (current) use of aspirin: Secondary | ICD-10-CM | POA: Diagnosis not present

## 2018-02-13 DIAGNOSIS — G834 Cauda equina syndrome: Secondary | ICD-10-CM | POA: Diagnosis not present

## 2018-02-15 DIAGNOSIS — Z7982 Long term (current) use of aspirin: Secondary | ICD-10-CM | POA: Diagnosis not present

## 2018-02-15 DIAGNOSIS — G8250 Quadriplegia, unspecified: Secondary | ICD-10-CM | POA: Diagnosis not present

## 2018-02-15 DIAGNOSIS — G834 Cauda equina syndrome: Secondary | ICD-10-CM | POA: Diagnosis not present

## 2018-02-15 DIAGNOSIS — Z791 Long term (current) use of non-steroidal anti-inflammatories (NSAID): Secondary | ICD-10-CM | POA: Diagnosis not present

## 2018-02-15 DIAGNOSIS — Z466 Encounter for fitting and adjustment of urinary device: Secondary | ICD-10-CM | POA: Diagnosis not present

## 2018-02-17 DIAGNOSIS — Z791 Long term (current) use of non-steroidal anti-inflammatories (NSAID): Secondary | ICD-10-CM | POA: Diagnosis not present

## 2018-02-17 DIAGNOSIS — G8250 Quadriplegia, unspecified: Secondary | ICD-10-CM | POA: Diagnosis not present

## 2018-02-17 DIAGNOSIS — Z466 Encounter for fitting and adjustment of urinary device: Secondary | ICD-10-CM | POA: Diagnosis not present

## 2018-02-17 DIAGNOSIS — Z7982 Long term (current) use of aspirin: Secondary | ICD-10-CM | POA: Diagnosis not present

## 2018-02-17 DIAGNOSIS — G834 Cauda equina syndrome: Secondary | ICD-10-CM | POA: Diagnosis not present

## 2018-02-20 DIAGNOSIS — G8250 Quadriplegia, unspecified: Secondary | ICD-10-CM | POA: Diagnosis not present

## 2018-02-20 DIAGNOSIS — Z7982 Long term (current) use of aspirin: Secondary | ICD-10-CM | POA: Diagnosis not present

## 2018-02-20 DIAGNOSIS — Z466 Encounter for fitting and adjustment of urinary device: Secondary | ICD-10-CM | POA: Diagnosis not present

## 2018-02-20 DIAGNOSIS — Z791 Long term (current) use of non-steroidal anti-inflammatories (NSAID): Secondary | ICD-10-CM | POA: Diagnosis not present

## 2018-02-20 DIAGNOSIS — G834 Cauda equina syndrome: Secondary | ICD-10-CM | POA: Diagnosis not present

## 2018-02-21 DIAGNOSIS — G8250 Quadriplegia, unspecified: Secondary | ICD-10-CM | POA: Diagnosis not present

## 2018-02-21 DIAGNOSIS — Z791 Long term (current) use of non-steroidal anti-inflammatories (NSAID): Secondary | ICD-10-CM | POA: Diagnosis not present

## 2018-02-21 DIAGNOSIS — Z7982 Long term (current) use of aspirin: Secondary | ICD-10-CM | POA: Diagnosis not present

## 2018-02-21 DIAGNOSIS — G834 Cauda equina syndrome: Secondary | ICD-10-CM | POA: Diagnosis not present

## 2018-02-21 DIAGNOSIS — Z466 Encounter for fitting and adjustment of urinary device: Secondary | ICD-10-CM | POA: Diagnosis not present

## 2018-02-22 DIAGNOSIS — Z7982 Long term (current) use of aspirin: Secondary | ICD-10-CM | POA: Diagnosis not present

## 2018-02-22 DIAGNOSIS — Z466 Encounter for fitting and adjustment of urinary device: Secondary | ICD-10-CM | POA: Diagnosis not present

## 2018-02-22 DIAGNOSIS — Z791 Long term (current) use of non-steroidal anti-inflammatories (NSAID): Secondary | ICD-10-CM | POA: Diagnosis not present

## 2018-02-22 DIAGNOSIS — G834 Cauda equina syndrome: Secondary | ICD-10-CM | POA: Diagnosis not present

## 2018-02-22 DIAGNOSIS — G8250 Quadriplegia, unspecified: Secondary | ICD-10-CM | POA: Diagnosis not present

## 2018-02-24 DIAGNOSIS — G8250 Quadriplegia, unspecified: Secondary | ICD-10-CM | POA: Diagnosis not present

## 2018-02-24 DIAGNOSIS — Z791 Long term (current) use of non-steroidal anti-inflammatories (NSAID): Secondary | ICD-10-CM | POA: Diagnosis not present

## 2018-02-24 DIAGNOSIS — Z7982 Long term (current) use of aspirin: Secondary | ICD-10-CM | POA: Diagnosis not present

## 2018-02-24 DIAGNOSIS — Z466 Encounter for fitting and adjustment of urinary device: Secondary | ICD-10-CM | POA: Diagnosis not present

## 2018-02-24 DIAGNOSIS — G834 Cauda equina syndrome: Secondary | ICD-10-CM | POA: Diagnosis not present

## 2018-02-28 DIAGNOSIS — G834 Cauda equina syndrome: Secondary | ICD-10-CM | POA: Diagnosis not present

## 2018-02-28 DIAGNOSIS — G8250 Quadriplegia, unspecified: Secondary | ICD-10-CM | POA: Diagnosis not present

## 2018-02-28 DIAGNOSIS — Z7982 Long term (current) use of aspirin: Secondary | ICD-10-CM | POA: Diagnosis not present

## 2018-02-28 DIAGNOSIS — Z791 Long term (current) use of non-steroidal anti-inflammatories (NSAID): Secondary | ICD-10-CM | POA: Diagnosis not present

## 2018-02-28 DIAGNOSIS — Z466 Encounter for fitting and adjustment of urinary device: Secondary | ICD-10-CM | POA: Diagnosis not present

## 2018-03-03 DIAGNOSIS — Z7982 Long term (current) use of aspirin: Secondary | ICD-10-CM | POA: Diagnosis not present

## 2018-03-03 DIAGNOSIS — Z466 Encounter for fitting and adjustment of urinary device: Secondary | ICD-10-CM | POA: Diagnosis not present

## 2018-03-03 DIAGNOSIS — Z791 Long term (current) use of non-steroidal anti-inflammatories (NSAID): Secondary | ICD-10-CM | POA: Diagnosis not present

## 2018-03-03 DIAGNOSIS — G8250 Quadriplegia, unspecified: Secondary | ICD-10-CM | POA: Diagnosis not present

## 2018-03-03 DIAGNOSIS — G834 Cauda equina syndrome: Secondary | ICD-10-CM | POA: Diagnosis not present

## 2018-03-06 DIAGNOSIS — R6889 Other general symptoms and signs: Secondary | ICD-10-CM | POA: Diagnosis not present

## 2018-03-06 DIAGNOSIS — Z01818 Encounter for other preprocedural examination: Secondary | ICD-10-CM | POA: Diagnosis not present

## 2018-03-06 DIAGNOSIS — G8250 Quadriplegia, unspecified: Secondary | ICD-10-CM | POA: Diagnosis not present

## 2018-03-06 DIAGNOSIS — Z791 Long term (current) use of non-steroidal anti-inflammatories (NSAID): Secondary | ICD-10-CM | POA: Diagnosis not present

## 2018-03-06 DIAGNOSIS — Z7982 Long term (current) use of aspirin: Secondary | ICD-10-CM | POA: Diagnosis not present

## 2018-03-06 DIAGNOSIS — G834 Cauda equina syndrome: Secondary | ICD-10-CM | POA: Diagnosis not present

## 2018-03-06 DIAGNOSIS — Z466 Encounter for fitting and adjustment of urinary device: Secondary | ICD-10-CM | POA: Diagnosis not present

## 2018-03-08 DIAGNOSIS — G8250 Quadriplegia, unspecified: Secondary | ICD-10-CM | POA: Diagnosis not present

## 2018-03-08 DIAGNOSIS — Z466 Encounter for fitting and adjustment of urinary device: Secondary | ICD-10-CM | POA: Diagnosis not present

## 2018-03-08 DIAGNOSIS — Z7982 Long term (current) use of aspirin: Secondary | ICD-10-CM | POA: Diagnosis not present

## 2018-03-08 DIAGNOSIS — G834 Cauda equina syndrome: Secondary | ICD-10-CM | POA: Diagnosis not present

## 2018-03-08 DIAGNOSIS — Z791 Long term (current) use of non-steroidal anti-inflammatories (NSAID): Secondary | ICD-10-CM | POA: Diagnosis not present

## 2018-03-10 DIAGNOSIS — Z7982 Long term (current) use of aspirin: Secondary | ICD-10-CM | POA: Diagnosis not present

## 2018-03-10 DIAGNOSIS — Z466 Encounter for fitting and adjustment of urinary device: Secondary | ICD-10-CM | POA: Diagnosis not present

## 2018-03-10 DIAGNOSIS — Z791 Long term (current) use of non-steroidal anti-inflammatories (NSAID): Secondary | ICD-10-CM | POA: Diagnosis not present

## 2018-03-10 DIAGNOSIS — G834 Cauda equina syndrome: Secondary | ICD-10-CM | POA: Diagnosis not present

## 2018-03-10 DIAGNOSIS — G8250 Quadriplegia, unspecified: Secondary | ICD-10-CM | POA: Diagnosis not present

## 2018-03-13 DIAGNOSIS — Z466 Encounter for fitting and adjustment of urinary device: Secondary | ICD-10-CM | POA: Diagnosis not present

## 2018-03-13 DIAGNOSIS — Z791 Long term (current) use of non-steroidal anti-inflammatories (NSAID): Secondary | ICD-10-CM | POA: Diagnosis not present

## 2018-03-13 DIAGNOSIS — G834 Cauda equina syndrome: Secondary | ICD-10-CM | POA: Diagnosis not present

## 2018-03-13 DIAGNOSIS — Z7982 Long term (current) use of aspirin: Secondary | ICD-10-CM | POA: Diagnosis not present

## 2018-03-13 DIAGNOSIS — G8250 Quadriplegia, unspecified: Secondary | ICD-10-CM | POA: Diagnosis not present

## 2018-03-15 DIAGNOSIS — Z7982 Long term (current) use of aspirin: Secondary | ICD-10-CM | POA: Diagnosis not present

## 2018-03-15 DIAGNOSIS — Z466 Encounter for fitting and adjustment of urinary device: Secondary | ICD-10-CM | POA: Diagnosis not present

## 2018-03-15 DIAGNOSIS — Z791 Long term (current) use of non-steroidal anti-inflammatories (NSAID): Secondary | ICD-10-CM | POA: Diagnosis not present

## 2018-03-15 DIAGNOSIS — G8250 Quadriplegia, unspecified: Secondary | ICD-10-CM | POA: Diagnosis not present

## 2018-03-15 DIAGNOSIS — G834 Cauda equina syndrome: Secondary | ICD-10-CM | POA: Diagnosis not present

## 2018-03-16 DIAGNOSIS — Z466 Encounter for fitting and adjustment of urinary device: Secondary | ICD-10-CM | POA: Diagnosis not present

## 2018-03-16 DIAGNOSIS — R3911 Hesitancy of micturition: Secondary | ICD-10-CM | POA: Diagnosis not present

## 2018-03-16 DIAGNOSIS — Z7982 Long term (current) use of aspirin: Secondary | ICD-10-CM | POA: Diagnosis not present

## 2018-03-16 DIAGNOSIS — G834 Cauda equina syndrome: Secondary | ICD-10-CM | POA: Diagnosis not present

## 2018-03-16 DIAGNOSIS — Z882 Allergy status to sulfonamides status: Secondary | ICD-10-CM | POA: Diagnosis not present

## 2018-03-16 DIAGNOSIS — K029 Dental caries, unspecified: Secondary | ICD-10-CM | POA: Diagnosis not present

## 2018-03-16 DIAGNOSIS — G8250 Quadriplegia, unspecified: Secondary | ICD-10-CM | POA: Diagnosis not present

## 2018-03-16 DIAGNOSIS — Z791 Long term (current) use of non-steroidal anti-inflammatories (NSAID): Secondary | ICD-10-CM | POA: Diagnosis not present

## 2018-03-17 DIAGNOSIS — Z7982 Long term (current) use of aspirin: Secondary | ICD-10-CM | POA: Diagnosis not present

## 2018-03-17 DIAGNOSIS — G8250 Quadriplegia, unspecified: Secondary | ICD-10-CM | POA: Diagnosis not present

## 2018-03-17 DIAGNOSIS — Z791 Long term (current) use of non-steroidal anti-inflammatories (NSAID): Secondary | ICD-10-CM | POA: Diagnosis not present

## 2018-03-17 DIAGNOSIS — G834 Cauda equina syndrome: Secondary | ICD-10-CM | POA: Diagnosis not present

## 2018-03-17 DIAGNOSIS — Z466 Encounter for fitting and adjustment of urinary device: Secondary | ICD-10-CM | POA: Diagnosis not present

## 2018-03-18 DIAGNOSIS — Z993 Dependence on wheelchair: Secondary | ICD-10-CM | POA: Diagnosis not present

## 2018-03-18 DIAGNOSIS — L89159 Pressure ulcer of sacral region, unspecified stage: Secondary | ICD-10-CM | POA: Diagnosis not present

## 2018-03-18 DIAGNOSIS — G8250 Quadriplegia, unspecified: Secondary | ICD-10-CM | POA: Diagnosis not present

## 2018-03-20 DIAGNOSIS — G834 Cauda equina syndrome: Secondary | ICD-10-CM | POA: Diagnosis not present

## 2018-03-20 DIAGNOSIS — G8250 Quadriplegia, unspecified: Secondary | ICD-10-CM | POA: Diagnosis not present

## 2018-03-20 DIAGNOSIS — Z466 Encounter for fitting and adjustment of urinary device: Secondary | ICD-10-CM | POA: Diagnosis not present

## 2018-03-20 DIAGNOSIS — Z7982 Long term (current) use of aspirin: Secondary | ICD-10-CM | POA: Diagnosis not present

## 2018-03-20 DIAGNOSIS — Z791 Long term (current) use of non-steroidal anti-inflammatories (NSAID): Secondary | ICD-10-CM | POA: Diagnosis not present

## 2018-03-22 DIAGNOSIS — G834 Cauda equina syndrome: Secondary | ICD-10-CM | POA: Diagnosis not present

## 2018-03-22 DIAGNOSIS — G8250 Quadriplegia, unspecified: Secondary | ICD-10-CM | POA: Diagnosis not present

## 2018-03-22 DIAGNOSIS — Z791 Long term (current) use of non-steroidal anti-inflammatories (NSAID): Secondary | ICD-10-CM | POA: Diagnosis not present

## 2018-03-22 DIAGNOSIS — Z7982 Long term (current) use of aspirin: Secondary | ICD-10-CM | POA: Diagnosis not present

## 2018-03-22 DIAGNOSIS — Z466 Encounter for fitting and adjustment of urinary device: Secondary | ICD-10-CM | POA: Diagnosis not present

## 2018-03-24 DIAGNOSIS — G8250 Quadriplegia, unspecified: Secondary | ICD-10-CM | POA: Diagnosis not present

## 2018-03-24 DIAGNOSIS — Z7982 Long term (current) use of aspirin: Secondary | ICD-10-CM | POA: Diagnosis not present

## 2018-03-24 DIAGNOSIS — Z791 Long term (current) use of non-steroidal anti-inflammatories (NSAID): Secondary | ICD-10-CM | POA: Diagnosis not present

## 2018-03-24 DIAGNOSIS — G834 Cauda equina syndrome: Secondary | ICD-10-CM | POA: Diagnosis not present

## 2018-03-24 DIAGNOSIS — Z466 Encounter for fitting and adjustment of urinary device: Secondary | ICD-10-CM | POA: Diagnosis not present

## 2018-03-27 DIAGNOSIS — G834 Cauda equina syndrome: Secondary | ICD-10-CM | POA: Diagnosis not present

## 2018-03-27 DIAGNOSIS — Z7982 Long term (current) use of aspirin: Secondary | ICD-10-CM | POA: Diagnosis not present

## 2018-03-27 DIAGNOSIS — Z466 Encounter for fitting and adjustment of urinary device: Secondary | ICD-10-CM | POA: Diagnosis not present

## 2018-03-27 DIAGNOSIS — Z791 Long term (current) use of non-steroidal anti-inflammatories (NSAID): Secondary | ICD-10-CM | POA: Diagnosis not present

## 2018-03-27 DIAGNOSIS — G8250 Quadriplegia, unspecified: Secondary | ICD-10-CM | POA: Diagnosis not present

## 2018-03-29 DIAGNOSIS — G8250 Quadriplegia, unspecified: Secondary | ICD-10-CM | POA: Diagnosis not present

## 2018-03-29 DIAGNOSIS — Z791 Long term (current) use of non-steroidal anti-inflammatories (NSAID): Secondary | ICD-10-CM | POA: Diagnosis not present

## 2018-03-29 DIAGNOSIS — Z7982 Long term (current) use of aspirin: Secondary | ICD-10-CM | POA: Diagnosis not present

## 2018-03-29 DIAGNOSIS — Z466 Encounter for fitting and adjustment of urinary device: Secondary | ICD-10-CM | POA: Diagnosis not present

## 2018-03-29 DIAGNOSIS — G834 Cauda equina syndrome: Secondary | ICD-10-CM | POA: Diagnosis not present

## 2018-03-30 DIAGNOSIS — Z7982 Long term (current) use of aspirin: Secondary | ICD-10-CM | POA: Diagnosis not present

## 2018-03-30 DIAGNOSIS — G8250 Quadriplegia, unspecified: Secondary | ICD-10-CM | POA: Diagnosis not present

## 2018-03-30 DIAGNOSIS — Z87891 Personal history of nicotine dependence: Secondary | ICD-10-CM | POA: Diagnosis not present

## 2018-03-30 DIAGNOSIS — G834 Cauda equina syndrome: Secondary | ICD-10-CM | POA: Diagnosis not present

## 2018-03-30 DIAGNOSIS — G8220 Paraplegia, unspecified: Secondary | ICD-10-CM | POA: Diagnosis not present

## 2018-03-30 DIAGNOSIS — Z87440 Personal history of urinary (tract) infections: Secondary | ICD-10-CM | POA: Diagnosis not present

## 2018-03-30 DIAGNOSIS — Z882 Allergy status to sulfonamides status: Secondary | ICD-10-CM | POA: Diagnosis not present

## 2018-03-30 DIAGNOSIS — R6889 Other general symptoms and signs: Secondary | ICD-10-CM | POA: Diagnosis not present

## 2018-03-30 DIAGNOSIS — Z791 Long term (current) use of non-steroidal anti-inflammatories (NSAID): Secondary | ICD-10-CM | POA: Diagnosis not present

## 2018-03-30 DIAGNOSIS — Z79899 Other long term (current) drug therapy: Secondary | ICD-10-CM | POA: Diagnosis not present

## 2018-03-30 DIAGNOSIS — N736 Female pelvic peritoneal adhesions (postinfective): Secondary | ICD-10-CM | POA: Diagnosis not present

## 2018-03-30 DIAGNOSIS — K219 Gastro-esophageal reflux disease without esophagitis: Secondary | ICD-10-CM | POA: Diagnosis not present

## 2018-03-30 DIAGNOSIS — Z466 Encounter for fitting and adjustment of urinary device: Secondary | ICD-10-CM | POA: Diagnosis not present

## 2018-03-30 HISTORY — PX: ROBOTIC ASSISTED SUPRACERVICAL HYSTERECTOMY WITH BILATERAL SALPINGO OOPHERECTOMY: SHX6084

## 2018-03-31 DIAGNOSIS — R6889 Other general symptoms and signs: Secondary | ICD-10-CM | POA: Diagnosis not present

## 2018-03-31 DIAGNOSIS — K219 Gastro-esophageal reflux disease without esophagitis: Secondary | ICD-10-CM | POA: Diagnosis not present

## 2018-03-31 DIAGNOSIS — Z882 Allergy status to sulfonamides status: Secondary | ICD-10-CM | POA: Diagnosis not present

## 2018-03-31 DIAGNOSIS — Z7982 Long term (current) use of aspirin: Secondary | ICD-10-CM | POA: Diagnosis not present

## 2018-03-31 DIAGNOSIS — G8220 Paraplegia, unspecified: Secondary | ICD-10-CM | POA: Diagnosis not present

## 2018-03-31 DIAGNOSIS — Z79899 Other long term (current) drug therapy: Secondary | ICD-10-CM | POA: Diagnosis not present

## 2018-03-31 DIAGNOSIS — Z87891 Personal history of nicotine dependence: Secondary | ICD-10-CM | POA: Diagnosis not present

## 2018-04-03 DIAGNOSIS — Z7982 Long term (current) use of aspirin: Secondary | ICD-10-CM | POA: Diagnosis not present

## 2018-04-03 DIAGNOSIS — Z466 Encounter for fitting and adjustment of urinary device: Secondary | ICD-10-CM | POA: Diagnosis not present

## 2018-04-03 DIAGNOSIS — G834 Cauda equina syndrome: Secondary | ICD-10-CM | POA: Diagnosis not present

## 2018-04-03 DIAGNOSIS — G8250 Quadriplegia, unspecified: Secondary | ICD-10-CM | POA: Diagnosis not present

## 2018-04-03 DIAGNOSIS — Z791 Long term (current) use of non-steroidal anti-inflammatories (NSAID): Secondary | ICD-10-CM | POA: Diagnosis not present

## 2018-04-06 DIAGNOSIS — Z791 Long term (current) use of non-steroidal anti-inflammatories (NSAID): Secondary | ICD-10-CM | POA: Diagnosis not present

## 2018-04-06 DIAGNOSIS — G8250 Quadriplegia, unspecified: Secondary | ICD-10-CM | POA: Diagnosis not present

## 2018-04-06 DIAGNOSIS — G834 Cauda equina syndrome: Secondary | ICD-10-CM | POA: Diagnosis not present

## 2018-04-06 DIAGNOSIS — Z466 Encounter for fitting and adjustment of urinary device: Secondary | ICD-10-CM | POA: Diagnosis not present

## 2018-04-06 DIAGNOSIS — Z7982 Long term (current) use of aspirin: Secondary | ICD-10-CM | POA: Diagnosis not present

## 2018-04-07 DIAGNOSIS — G834 Cauda equina syndrome: Secondary | ICD-10-CM | POA: Diagnosis not present

## 2018-04-07 DIAGNOSIS — G8250 Quadriplegia, unspecified: Secondary | ICD-10-CM | POA: Diagnosis not present

## 2018-04-07 DIAGNOSIS — Z466 Encounter for fitting and adjustment of urinary device: Secondary | ICD-10-CM | POA: Diagnosis not present

## 2018-04-07 DIAGNOSIS — Z791 Long term (current) use of non-steroidal anti-inflammatories (NSAID): Secondary | ICD-10-CM | POA: Diagnosis not present

## 2018-04-07 DIAGNOSIS — Z7982 Long term (current) use of aspirin: Secondary | ICD-10-CM | POA: Diagnosis not present

## 2018-04-10 DIAGNOSIS — Z791 Long term (current) use of non-steroidal anti-inflammatories (NSAID): Secondary | ICD-10-CM | POA: Diagnosis not present

## 2018-04-10 DIAGNOSIS — Z7982 Long term (current) use of aspirin: Secondary | ICD-10-CM | POA: Diagnosis not present

## 2018-04-10 DIAGNOSIS — G8250 Quadriplegia, unspecified: Secondary | ICD-10-CM | POA: Diagnosis not present

## 2018-04-10 DIAGNOSIS — G834 Cauda equina syndrome: Secondary | ICD-10-CM | POA: Diagnosis not present

## 2018-04-10 DIAGNOSIS — Z466 Encounter for fitting and adjustment of urinary device: Secondary | ICD-10-CM | POA: Diagnosis not present

## 2018-04-12 DIAGNOSIS — Z466 Encounter for fitting and adjustment of urinary device: Secondary | ICD-10-CM | POA: Diagnosis not present

## 2018-04-12 DIAGNOSIS — G8250 Quadriplegia, unspecified: Secondary | ICD-10-CM | POA: Diagnosis not present

## 2018-04-12 DIAGNOSIS — Z791 Long term (current) use of non-steroidal anti-inflammatories (NSAID): Secondary | ICD-10-CM | POA: Diagnosis not present

## 2018-04-12 DIAGNOSIS — G834 Cauda equina syndrome: Secondary | ICD-10-CM | POA: Diagnosis not present

## 2018-04-12 DIAGNOSIS — Z7982 Long term (current) use of aspirin: Secondary | ICD-10-CM | POA: Diagnosis not present

## 2018-04-14 DIAGNOSIS — G8250 Quadriplegia, unspecified: Secondary | ICD-10-CM | POA: Diagnosis not present

## 2018-04-14 DIAGNOSIS — Z791 Long term (current) use of non-steroidal anti-inflammatories (NSAID): Secondary | ICD-10-CM | POA: Diagnosis not present

## 2018-04-14 DIAGNOSIS — G834 Cauda equina syndrome: Secondary | ICD-10-CM | POA: Diagnosis not present

## 2018-04-14 DIAGNOSIS — Z7982 Long term (current) use of aspirin: Secondary | ICD-10-CM | POA: Diagnosis not present

## 2018-04-14 DIAGNOSIS — Z466 Encounter for fitting and adjustment of urinary device: Secondary | ICD-10-CM | POA: Diagnosis not present

## 2018-04-17 DIAGNOSIS — Z466 Encounter for fitting and adjustment of urinary device: Secondary | ICD-10-CM | POA: Diagnosis not present

## 2018-04-17 DIAGNOSIS — Z791 Long term (current) use of non-steroidal anti-inflammatories (NSAID): Secondary | ICD-10-CM | POA: Diagnosis not present

## 2018-04-17 DIAGNOSIS — Z7982 Long term (current) use of aspirin: Secondary | ICD-10-CM | POA: Diagnosis not present

## 2018-04-17 DIAGNOSIS — G8250 Quadriplegia, unspecified: Secondary | ICD-10-CM | POA: Diagnosis not present

## 2018-04-17 DIAGNOSIS — G834 Cauda equina syndrome: Secondary | ICD-10-CM | POA: Diagnosis not present

## 2018-04-19 DIAGNOSIS — Z791 Long term (current) use of non-steroidal anti-inflammatories (NSAID): Secondary | ICD-10-CM | POA: Diagnosis not present

## 2018-04-19 DIAGNOSIS — G834 Cauda equina syndrome: Secondary | ICD-10-CM | POA: Diagnosis not present

## 2018-04-19 DIAGNOSIS — Z7982 Long term (current) use of aspirin: Secondary | ICD-10-CM | POA: Diagnosis not present

## 2018-04-19 DIAGNOSIS — Z466 Encounter for fitting and adjustment of urinary device: Secondary | ICD-10-CM | POA: Diagnosis not present

## 2018-04-19 DIAGNOSIS — G8250 Quadriplegia, unspecified: Secondary | ICD-10-CM | POA: Diagnosis not present

## 2018-04-20 DIAGNOSIS — G8250 Quadriplegia, unspecified: Secondary | ICD-10-CM | POA: Diagnosis not present

## 2018-04-20 DIAGNOSIS — Z466 Encounter for fitting and adjustment of urinary device: Secondary | ICD-10-CM | POA: Diagnosis not present

## 2018-04-20 DIAGNOSIS — Z7982 Long term (current) use of aspirin: Secondary | ICD-10-CM | POA: Diagnosis not present

## 2018-04-20 DIAGNOSIS — Z791 Long term (current) use of non-steroidal anti-inflammatories (NSAID): Secondary | ICD-10-CM | POA: Diagnosis not present

## 2018-04-20 DIAGNOSIS — G834 Cauda equina syndrome: Secondary | ICD-10-CM | POA: Diagnosis not present

## 2018-04-21 DIAGNOSIS — Z466 Encounter for fitting and adjustment of urinary device: Secondary | ICD-10-CM | POA: Diagnosis not present

## 2018-04-21 DIAGNOSIS — G8250 Quadriplegia, unspecified: Secondary | ICD-10-CM | POA: Diagnosis not present

## 2018-04-21 DIAGNOSIS — Z7982 Long term (current) use of aspirin: Secondary | ICD-10-CM | POA: Diagnosis not present

## 2018-04-21 DIAGNOSIS — Z791 Long term (current) use of non-steroidal anti-inflammatories (NSAID): Secondary | ICD-10-CM | POA: Diagnosis not present

## 2018-04-21 DIAGNOSIS — G834 Cauda equina syndrome: Secondary | ICD-10-CM | POA: Diagnosis not present

## 2018-04-24 DIAGNOSIS — Z7982 Long term (current) use of aspirin: Secondary | ICD-10-CM | POA: Diagnosis not present

## 2018-04-24 DIAGNOSIS — G834 Cauda equina syndrome: Secondary | ICD-10-CM | POA: Diagnosis not present

## 2018-04-24 DIAGNOSIS — G8250 Quadriplegia, unspecified: Secondary | ICD-10-CM | POA: Diagnosis not present

## 2018-04-24 DIAGNOSIS — Z466 Encounter for fitting and adjustment of urinary device: Secondary | ICD-10-CM | POA: Diagnosis not present

## 2018-04-24 DIAGNOSIS — Z791 Long term (current) use of non-steroidal anti-inflammatories (NSAID): Secondary | ICD-10-CM | POA: Diagnosis not present

## 2018-04-26 DIAGNOSIS — G8250 Quadriplegia, unspecified: Secondary | ICD-10-CM | POA: Diagnosis not present

## 2018-04-26 DIAGNOSIS — Z7982 Long term (current) use of aspirin: Secondary | ICD-10-CM | POA: Diagnosis not present

## 2018-04-26 DIAGNOSIS — Z466 Encounter for fitting and adjustment of urinary device: Secondary | ICD-10-CM | POA: Diagnosis not present

## 2018-04-26 DIAGNOSIS — Z791 Long term (current) use of non-steroidal anti-inflammatories (NSAID): Secondary | ICD-10-CM | POA: Diagnosis not present

## 2018-04-26 DIAGNOSIS — G834 Cauda equina syndrome: Secondary | ICD-10-CM | POA: Diagnosis not present

## 2018-04-28 DIAGNOSIS — G8250 Quadriplegia, unspecified: Secondary | ICD-10-CM | POA: Diagnosis not present

## 2018-04-28 DIAGNOSIS — Z7982 Long term (current) use of aspirin: Secondary | ICD-10-CM | POA: Diagnosis not present

## 2018-04-28 DIAGNOSIS — Z466 Encounter for fitting and adjustment of urinary device: Secondary | ICD-10-CM | POA: Diagnosis not present

## 2018-04-28 DIAGNOSIS — G834 Cauda equina syndrome: Secondary | ICD-10-CM | POA: Diagnosis not present

## 2018-04-28 DIAGNOSIS — Z791 Long term (current) use of non-steroidal anti-inflammatories (NSAID): Secondary | ICD-10-CM | POA: Diagnosis not present

## 2018-05-01 DIAGNOSIS — Z466 Encounter for fitting and adjustment of urinary device: Secondary | ICD-10-CM | POA: Diagnosis not present

## 2018-05-01 DIAGNOSIS — G8250 Quadriplegia, unspecified: Secondary | ICD-10-CM | POA: Diagnosis not present

## 2018-05-01 DIAGNOSIS — Z791 Long term (current) use of non-steroidal anti-inflammatories (NSAID): Secondary | ICD-10-CM | POA: Diagnosis not present

## 2018-05-01 DIAGNOSIS — Z7982 Long term (current) use of aspirin: Secondary | ICD-10-CM | POA: Diagnosis not present

## 2018-05-01 DIAGNOSIS — G834 Cauda equina syndrome: Secondary | ICD-10-CM | POA: Diagnosis not present

## 2018-05-03 DIAGNOSIS — Z466 Encounter for fitting and adjustment of urinary device: Secondary | ICD-10-CM | POA: Diagnosis not present

## 2018-05-03 DIAGNOSIS — G8250 Quadriplegia, unspecified: Secondary | ICD-10-CM | POA: Diagnosis not present

## 2018-05-03 DIAGNOSIS — Z09 Encounter for follow-up examination after completed treatment for conditions other than malignant neoplasm: Secondary | ICD-10-CM | POA: Diagnosis not present

## 2018-05-03 DIAGNOSIS — Z791 Long term (current) use of non-steroidal anti-inflammatories (NSAID): Secondary | ICD-10-CM | POA: Diagnosis not present

## 2018-05-03 DIAGNOSIS — G834 Cauda equina syndrome: Secondary | ICD-10-CM | POA: Diagnosis not present

## 2018-05-03 DIAGNOSIS — Z7982 Long term (current) use of aspirin: Secondary | ICD-10-CM | POA: Diagnosis not present

## 2018-05-03 DIAGNOSIS — R6889 Other general symptoms and signs: Secondary | ICD-10-CM | POA: Diagnosis not present

## 2018-05-05 DIAGNOSIS — G8250 Quadriplegia, unspecified: Secondary | ICD-10-CM | POA: Diagnosis not present

## 2018-05-05 DIAGNOSIS — Z791 Long term (current) use of non-steroidal anti-inflammatories (NSAID): Secondary | ICD-10-CM | POA: Diagnosis not present

## 2018-05-05 DIAGNOSIS — G834 Cauda equina syndrome: Secondary | ICD-10-CM | POA: Diagnosis not present

## 2018-05-05 DIAGNOSIS — Z7982 Long term (current) use of aspirin: Secondary | ICD-10-CM | POA: Diagnosis not present

## 2018-05-05 DIAGNOSIS — Z466 Encounter for fitting and adjustment of urinary device: Secondary | ICD-10-CM | POA: Diagnosis not present

## 2018-05-08 DIAGNOSIS — G8250 Quadriplegia, unspecified: Secondary | ICD-10-CM | POA: Diagnosis not present

## 2018-05-08 DIAGNOSIS — Z791 Long term (current) use of non-steroidal anti-inflammatories (NSAID): Secondary | ICD-10-CM | POA: Diagnosis not present

## 2018-05-08 DIAGNOSIS — G834 Cauda equina syndrome: Secondary | ICD-10-CM | POA: Diagnosis not present

## 2018-05-08 DIAGNOSIS — Z466 Encounter for fitting and adjustment of urinary device: Secondary | ICD-10-CM | POA: Diagnosis not present

## 2018-05-08 DIAGNOSIS — Z7982 Long term (current) use of aspirin: Secondary | ICD-10-CM | POA: Diagnosis not present

## 2018-05-09 DIAGNOSIS — Z7982 Long term (current) use of aspirin: Secondary | ICD-10-CM | POA: Diagnosis not present

## 2018-05-09 DIAGNOSIS — G8250 Quadriplegia, unspecified: Secondary | ICD-10-CM | POA: Diagnosis not present

## 2018-05-09 DIAGNOSIS — Z466 Encounter for fitting and adjustment of urinary device: Secondary | ICD-10-CM | POA: Diagnosis not present

## 2018-05-09 DIAGNOSIS — Z791 Long term (current) use of non-steroidal anti-inflammatories (NSAID): Secondary | ICD-10-CM | POA: Diagnosis not present

## 2018-05-09 DIAGNOSIS — G834 Cauda equina syndrome: Secondary | ICD-10-CM | POA: Diagnosis not present

## 2018-05-10 DIAGNOSIS — Z87891 Personal history of nicotine dependence: Secondary | ICD-10-CM | POA: Diagnosis not present

## 2018-05-10 DIAGNOSIS — Z466 Encounter for fitting and adjustment of urinary device: Secondary | ICD-10-CM | POA: Diagnosis not present

## 2018-05-10 DIAGNOSIS — R6889 Other general symptoms and signs: Secondary | ICD-10-CM | POA: Diagnosis not present

## 2018-05-10 DIAGNOSIS — Z7982 Long term (current) use of aspirin: Secondary | ICD-10-CM | POA: Diagnosis not present

## 2018-05-10 DIAGNOSIS — G834 Cauda equina syndrome: Secondary | ICD-10-CM | POA: Diagnosis not present

## 2018-05-10 DIAGNOSIS — G8250 Quadriplegia, unspecified: Secondary | ICD-10-CM | POA: Diagnosis not present

## 2018-05-10 DIAGNOSIS — Z791 Long term (current) use of non-steroidal anti-inflammatories (NSAID): Secondary | ICD-10-CM | POA: Diagnosis not present

## 2018-05-12 DIAGNOSIS — Z7982 Long term (current) use of aspirin: Secondary | ICD-10-CM | POA: Diagnosis not present

## 2018-05-12 DIAGNOSIS — G834 Cauda equina syndrome: Secondary | ICD-10-CM | POA: Diagnosis not present

## 2018-05-12 DIAGNOSIS — Z791 Long term (current) use of non-steroidal anti-inflammatories (NSAID): Secondary | ICD-10-CM | POA: Diagnosis not present

## 2018-05-12 DIAGNOSIS — G8250 Quadriplegia, unspecified: Secondary | ICD-10-CM | POA: Diagnosis not present

## 2018-05-12 DIAGNOSIS — Z466 Encounter for fitting and adjustment of urinary device: Secondary | ICD-10-CM | POA: Diagnosis not present

## 2018-05-15 DIAGNOSIS — G834 Cauda equina syndrome: Secondary | ICD-10-CM | POA: Diagnosis not present

## 2018-05-15 DIAGNOSIS — Z791 Long term (current) use of non-steroidal anti-inflammatories (NSAID): Secondary | ICD-10-CM | POA: Diagnosis not present

## 2018-05-15 DIAGNOSIS — Z466 Encounter for fitting and adjustment of urinary device: Secondary | ICD-10-CM | POA: Diagnosis not present

## 2018-05-15 DIAGNOSIS — G8250 Quadriplegia, unspecified: Secondary | ICD-10-CM | POA: Diagnosis not present

## 2018-05-15 DIAGNOSIS — Z7982 Long term (current) use of aspirin: Secondary | ICD-10-CM | POA: Diagnosis not present

## 2018-05-17 ENCOUNTER — Other Ambulatory Visit: Payer: Self-pay

## 2018-05-17 DIAGNOSIS — Z791 Long term (current) use of non-steroidal anti-inflammatories (NSAID): Secondary | ICD-10-CM | POA: Diagnosis not present

## 2018-05-17 DIAGNOSIS — Z466 Encounter for fitting and adjustment of urinary device: Secondary | ICD-10-CM | POA: Diagnosis not present

## 2018-05-17 DIAGNOSIS — G834 Cauda equina syndrome: Secondary | ICD-10-CM | POA: Diagnosis not present

## 2018-05-17 DIAGNOSIS — Z7982 Long term (current) use of aspirin: Secondary | ICD-10-CM | POA: Diagnosis not present

## 2018-05-17 DIAGNOSIS — G8250 Quadriplegia, unspecified: Secondary | ICD-10-CM | POA: Diagnosis not present

## 2018-05-17 NOTE — Patient Outreach (Signed)
[place] [facility]) Care Management  05/17/2018  Heidi Hess 08/25/1960 [id number]   Successful outreach to patient regarding social work referral for transportation resource and in-home assistance.  Patient reports needing to receive radiation treatment in [facility] 5 times per week for 5 weeks.  She only has two rides remaining through [employer] and family is unable to take her.  BSW agreed to contact [employer], transportation services in [place], regarding options for transport to [facility]. Per [facility]:  Bus picks patient's up at 5:00 AM and takes them to the [facility] in [place] then transports to the [facility]. Patient then has to take shuttle to appropriate area at the facility.  [employer] was unsure if shuttles are handicap accessible.  BSW conducted call to [facility] who confirmed that shuttle has wheelchair lift.  The cost for this service would be $20 per day Patient is currently receiving home health services including an aide three times per week.  Patient reports needing an aide five times per week in order to go to treatment.  BSW informed her that these services would have to be paid for out of pocket.  BSW offered to mail a list of providers but patient declined. BSW will follow up with patient before the end of the week regarding information gathered about transportation.  Ronn Melena, BSW Social Worker [phone]

## 2018-05-17 NOTE — Patient Outreach (Signed)
[place] [facility]) Care Management  05/17/2018  Heidi Hess [birth year] [id number]   Referral Date: 05/17/2018 Referral Source: [place] Referral Reason: HHA and help with transportation  Spoke with [name] with [place].  She states that patient quadriplegic and has endometrial cancer and patient is to have a procedure on Friday but patient is to have radiation treatments scheduled 5/wk for 3 weeks.  Patient will be utilizing all of her allotted transportation through [facility] on Friday and needs help with transportation.  Patient now has [place] in and has a HHA 3/wk and needing care 2/wk and wondering if [facility] could help.  Discussed [facility] services with [name] and advised that we have resources but do not provide HHA services.  However, I advised that I would call patient and complete referral for social work for any resources.     Outreach Attempt: spoke with patient.  She is able to verify HIPAA.  Advised reason for referral.  Patient states that she is quadriplegic and needs assist with all aspects of care.  She states she now has endometrial cancer and needing treatment but needs care and rides for appointments.  Patient lives with her 48 year old mother and her sister who cannot assist with her care.  Patient states that she does not have the resources to pay for care.  Patient states she applied for Medicaid last year and she made too much with [employer].  She states she makes $1104/month.    Discussed [facility] services with patient. She is agreeable for social work referral for possible resources.    Plan: RN CM will refer patient to social work for possible HHA and transportation resources.   RN CM will sign off case.     Jone Baseman, RN, MSN [facility] Care Management Care Management Coordinator Direct Line [phone] Toll Free: [phone]  Fax: [phone]

## 2018-05-18 ENCOUNTER — Other Ambulatory Visit: Payer: Self-pay

## 2018-05-18 NOTE — Patient Outreach (Signed)
[place] [facility]) Care Management  05/18/2018  [name] Jan 20, 1961 [id number]   Incoming call from [place], [place], regarding patient's need for transportation to radiation treatments in [place]. Per [place], patient's case was discussed today during the [facility] Case Conference facilitated by [place].  The team decided that [facility] Social Worker will call [facility] to determine if patient can receive treatment at [facility] since transportation is a barrier.  Chrystal agreed to follow up with BSW about the location of treatment.    Ronn Melena, BSW Social Worker [phone]

## 2018-05-19 ENCOUNTER — Ambulatory Visit: Payer: Self-pay

## 2018-05-19 DIAGNOSIS — Z791 Long term (current) use of non-steroidal anti-inflammatories (NSAID): Secondary | ICD-10-CM | POA: Diagnosis not present

## 2018-05-19 DIAGNOSIS — G834 Cauda equina syndrome: Secondary | ICD-10-CM | POA: Diagnosis not present

## 2018-05-19 DIAGNOSIS — Z7982 Long term (current) use of aspirin: Secondary | ICD-10-CM | POA: Diagnosis not present

## 2018-05-19 DIAGNOSIS — Z466 Encounter for fitting and adjustment of urinary device: Secondary | ICD-10-CM | POA: Diagnosis not present

## 2018-05-19 DIAGNOSIS — G8250 Quadriplegia, unspecified: Secondary | ICD-10-CM | POA: Diagnosis not present

## 2018-05-22 DIAGNOSIS — Z466 Encounter for fitting and adjustment of urinary device: Secondary | ICD-10-CM | POA: Diagnosis not present

## 2018-05-22 DIAGNOSIS — G8250 Quadriplegia, unspecified: Secondary | ICD-10-CM | POA: Diagnosis not present

## 2018-05-22 DIAGNOSIS — Z7982 Long term (current) use of aspirin: Secondary | ICD-10-CM | POA: Diagnosis not present

## 2018-05-22 DIAGNOSIS — Z791 Long term (current) use of non-steroidal anti-inflammatories (NSAID): Secondary | ICD-10-CM | POA: Diagnosis not present

## 2018-05-22 DIAGNOSIS — G834 Cauda equina syndrome: Secondary | ICD-10-CM | POA: Diagnosis not present

## 2018-05-23 DIAGNOSIS — Z791 Long term (current) use of non-steroidal anti-inflammatories (NSAID): Secondary | ICD-10-CM | POA: Diagnosis not present

## 2018-05-23 DIAGNOSIS — G834 Cauda equina syndrome: Secondary | ICD-10-CM | POA: Diagnosis not present

## 2018-05-23 DIAGNOSIS — Z466 Encounter for fitting and adjustment of urinary device: Secondary | ICD-10-CM | POA: Diagnosis not present

## 2018-05-23 DIAGNOSIS — Z7982 Long term (current) use of aspirin: Secondary | ICD-10-CM | POA: Diagnosis not present

## 2018-05-23 DIAGNOSIS — G8250 Quadriplegia, unspecified: Secondary | ICD-10-CM | POA: Diagnosis not present

## 2018-05-24 DIAGNOSIS — Z7982 Long term (current) use of aspirin: Secondary | ICD-10-CM | POA: Diagnosis not present

## 2018-05-24 DIAGNOSIS — Z466 Encounter for fitting and adjustment of urinary device: Secondary | ICD-10-CM | POA: Diagnosis not present

## 2018-05-24 DIAGNOSIS — Z791 Long term (current) use of non-steroidal anti-inflammatories (NSAID): Secondary | ICD-10-CM | POA: Diagnosis not present

## 2018-05-24 DIAGNOSIS — G8250 Quadriplegia, unspecified: Secondary | ICD-10-CM | POA: Diagnosis not present

## 2018-05-24 DIAGNOSIS — G834 Cauda equina syndrome: Secondary | ICD-10-CM | POA: Diagnosis not present

## 2018-05-26 DIAGNOSIS — G8250 Quadriplegia, unspecified: Secondary | ICD-10-CM | POA: Diagnosis not present

## 2018-05-26 DIAGNOSIS — Z7982 Long term (current) use of aspirin: Secondary | ICD-10-CM | POA: Diagnosis not present

## 2018-05-26 DIAGNOSIS — G834 Cauda equina syndrome: Secondary | ICD-10-CM | POA: Diagnosis not present

## 2018-05-26 DIAGNOSIS — Z466 Encounter for fitting and adjustment of urinary device: Secondary | ICD-10-CM | POA: Diagnosis not present

## 2018-05-26 DIAGNOSIS — Z791 Long term (current) use of non-steroidal anti-inflammatories (NSAID): Secondary | ICD-10-CM | POA: Diagnosis not present

## 2018-05-29 DIAGNOSIS — G834 Cauda equina syndrome: Secondary | ICD-10-CM | POA: Diagnosis not present

## 2018-05-29 DIAGNOSIS — Z791 Long term (current) use of non-steroidal anti-inflammatories (NSAID): Secondary | ICD-10-CM | POA: Diagnosis not present

## 2018-05-29 DIAGNOSIS — Z7982 Long term (current) use of aspirin: Secondary | ICD-10-CM | POA: Diagnosis not present

## 2018-05-29 DIAGNOSIS — Z466 Encounter for fitting and adjustment of urinary device: Secondary | ICD-10-CM | POA: Diagnosis not present

## 2018-05-29 DIAGNOSIS — G8250 Quadriplegia, unspecified: Secondary | ICD-10-CM | POA: Diagnosis not present

## 2018-05-31 DIAGNOSIS — G8250 Quadriplegia, unspecified: Secondary | ICD-10-CM | POA: Diagnosis not present

## 2018-05-31 DIAGNOSIS — Z791 Long term (current) use of non-steroidal anti-inflammatories (NSAID): Secondary | ICD-10-CM | POA: Diagnosis not present

## 2018-05-31 DIAGNOSIS — Z466 Encounter for fitting and adjustment of urinary device: Secondary | ICD-10-CM | POA: Diagnosis not present

## 2018-05-31 DIAGNOSIS — G834 Cauda equina syndrome: Secondary | ICD-10-CM | POA: Diagnosis not present

## 2018-05-31 DIAGNOSIS — Z7982 Long term (current) use of aspirin: Secondary | ICD-10-CM | POA: Diagnosis not present

## 2018-06-02 DIAGNOSIS — Z791 Long term (current) use of non-steroidal anti-inflammatories (NSAID): Secondary | ICD-10-CM | POA: Diagnosis not present

## 2018-06-02 DIAGNOSIS — G834 Cauda equina syndrome: Secondary | ICD-10-CM | POA: Diagnosis not present

## 2018-06-02 DIAGNOSIS — G8250 Quadriplegia, unspecified: Secondary | ICD-10-CM | POA: Diagnosis not present

## 2018-06-02 DIAGNOSIS — Z7982 Long term (current) use of aspirin: Secondary | ICD-10-CM | POA: Diagnosis not present

## 2018-06-02 DIAGNOSIS — Z466 Encounter for fitting and adjustment of urinary device: Secondary | ICD-10-CM | POA: Diagnosis not present

## 2018-06-05 DIAGNOSIS — G834 Cauda equina syndrome: Secondary | ICD-10-CM | POA: Diagnosis not present

## 2018-06-05 DIAGNOSIS — Z7982 Long term (current) use of aspirin: Secondary | ICD-10-CM | POA: Diagnosis not present

## 2018-06-05 DIAGNOSIS — Z791 Long term (current) use of non-steroidal anti-inflammatories (NSAID): Secondary | ICD-10-CM | POA: Diagnosis not present

## 2018-06-05 DIAGNOSIS — G8250 Quadriplegia, unspecified: Secondary | ICD-10-CM | POA: Diagnosis not present

## 2018-06-05 DIAGNOSIS — Z466 Encounter for fitting and adjustment of urinary device: Secondary | ICD-10-CM | POA: Diagnosis not present

## 2018-06-06 DIAGNOSIS — G834 Cauda equina syndrome: Secondary | ICD-10-CM | POA: Diagnosis not present

## 2018-06-06 DIAGNOSIS — Z466 Encounter for fitting and adjustment of urinary device: Secondary | ICD-10-CM | POA: Diagnosis not present

## 2018-06-06 DIAGNOSIS — G8250 Quadriplegia, unspecified: Secondary | ICD-10-CM | POA: Diagnosis not present

## 2018-06-06 DIAGNOSIS — Z791 Long term (current) use of non-steroidal anti-inflammatories (NSAID): Secondary | ICD-10-CM | POA: Diagnosis not present

## 2018-06-06 DIAGNOSIS — Z7982 Long term (current) use of aspirin: Secondary | ICD-10-CM | POA: Diagnosis not present

## 2018-06-07 DIAGNOSIS — Z7982 Long term (current) use of aspirin: Secondary | ICD-10-CM | POA: Diagnosis not present

## 2018-06-07 DIAGNOSIS — G834 Cauda equina syndrome: Secondary | ICD-10-CM | POA: Diagnosis not present

## 2018-06-07 DIAGNOSIS — Z466 Encounter for fitting and adjustment of urinary device: Secondary | ICD-10-CM | POA: Diagnosis not present

## 2018-06-07 DIAGNOSIS — G8250 Quadriplegia, unspecified: Secondary | ICD-10-CM | POA: Diagnosis not present

## 2018-06-07 DIAGNOSIS — Z791 Long term (current) use of non-steroidal anti-inflammatories (NSAID): Secondary | ICD-10-CM | POA: Diagnosis not present

## 2018-06-08 ENCOUNTER — Encounter: Payer: Self-pay | Admitting: *Deleted

## 2018-06-08 NOTE — Telephone Encounter (Signed)
This encounter was created in error - please disregard.

## 2018-06-08 NOTE — Patient Outreach (Signed)
[facility]) Care Management  06/08/2018  Heidi Hess [birth year] [id number]   Late Entry-patient discussed in [facility] Home case conference regarding transportation to radiation treatments in [place]. Per the social worker [name] with the [facility], it has not been determined if patient will require travel to [facility] for treatments or the possibility of receiving treatments locally. Per [name], once this is determined, she will call this social worker back with any transportation needs the patient may have to treatments.  [name] [facility] Care Management [phone]

## 2018-06-09 ENCOUNTER — Other Ambulatory Visit: Payer: Self-pay

## 2018-06-09 ENCOUNTER — Other Ambulatory Visit: Payer: Self-pay | Admitting: *Deleted

## 2018-06-09 DIAGNOSIS — G8250 Quadriplegia, unspecified: Secondary | ICD-10-CM | POA: Diagnosis not present

## 2018-06-09 DIAGNOSIS — L89159 Pressure ulcer of sacral region, unspecified stage: Secondary | ICD-10-CM | POA: Diagnosis not present

## 2018-06-09 DIAGNOSIS — G834 Cauda equina syndrome: Secondary | ICD-10-CM | POA: Diagnosis not present

## 2018-06-09 DIAGNOSIS — Z466 Encounter for fitting and adjustment of urinary device: Secondary | ICD-10-CM | POA: Diagnosis not present

## 2018-06-09 DIAGNOSIS — Z791 Long term (current) use of non-steroidal anti-inflammatories (NSAID): Secondary | ICD-10-CM | POA: Diagnosis not present

## 2018-06-09 DIAGNOSIS — Z7982 Long term (current) use of aspirin: Secondary | ICD-10-CM | POA: Diagnosis not present

## 2018-06-09 DIAGNOSIS — Z993 Dependence on wheelchair: Secondary | ICD-10-CM | POA: Diagnosis not present

## 2018-06-09 NOTE — Patient Outreach (Signed)
[place]) Care Management  06/09/2018  Heidi Hess 1960-06-18 [id number]   Patient discussed in [facility] case conference yesterday regarding transportation to radiation treatments in [place]. Per the social worker [name] with the [facility], it is recommending that patient receive her radiation treatment at [facility]. [name] has spoken to patient who confirms that her treatments have been postponed at this time, however she has applied for a income based program with [facility]. If approved they will assist in providing her with transportation to her treatments in [place] as well as in home care. Per [name], patient received this information through her insurance company.  This Education officer, museum will sign off at this time.   [name] [facility] Care Management [phone]

## 2018-06-09 NOTE — Patient Outreach (Signed)
[place] [facility]) Care Management  06/09/2018  HARLI ENGELKEN 05/26/1960 [id number]   Communication form [facility] CSW, [place] that patient's radiation treatments have been postponed and she has applied for a program recommended by her insurance company for [employer]. They will provide transportation and in home care. [facility] BSW closing case at this time.   Ronn Melena, BSW Social Worker [phone]

## 2018-06-12 DIAGNOSIS — Z7982 Long term (current) use of aspirin: Secondary | ICD-10-CM | POA: Diagnosis not present

## 2018-06-12 DIAGNOSIS — G834 Cauda equina syndrome: Secondary | ICD-10-CM | POA: Diagnosis not present

## 2018-06-12 DIAGNOSIS — G8250 Quadriplegia, unspecified: Secondary | ICD-10-CM | POA: Diagnosis not present

## 2018-06-12 DIAGNOSIS — Z791 Long term (current) use of non-steroidal anti-inflammatories (NSAID): Secondary | ICD-10-CM | POA: Diagnosis not present

## 2018-06-12 DIAGNOSIS — Z466 Encounter for fitting and adjustment of urinary device: Secondary | ICD-10-CM | POA: Diagnosis not present

## 2018-06-13 DIAGNOSIS — G8250 Quadriplegia, unspecified: Secondary | ICD-10-CM | POA: Diagnosis not present

## 2018-06-13 DIAGNOSIS — G834 Cauda equina syndrome: Secondary | ICD-10-CM | POA: Diagnosis not present

## 2018-06-13 DIAGNOSIS — Z466 Encounter for fitting and adjustment of urinary device: Secondary | ICD-10-CM | POA: Diagnosis not present

## 2018-06-13 DIAGNOSIS — Z791 Long term (current) use of non-steroidal anti-inflammatories (NSAID): Secondary | ICD-10-CM | POA: Diagnosis not present

## 2018-06-13 DIAGNOSIS — Z7982 Long term (current) use of aspirin: Secondary | ICD-10-CM | POA: Diagnosis not present

## 2018-06-14 DIAGNOSIS — G834 Cauda equina syndrome: Secondary | ICD-10-CM | POA: Diagnosis not present

## 2018-06-14 DIAGNOSIS — Z7982 Long term (current) use of aspirin: Secondary | ICD-10-CM | POA: Diagnosis not present

## 2018-06-14 DIAGNOSIS — Z791 Long term (current) use of non-steroidal anti-inflammatories (NSAID): Secondary | ICD-10-CM | POA: Diagnosis not present

## 2018-06-14 DIAGNOSIS — G8250 Quadriplegia, unspecified: Secondary | ICD-10-CM | POA: Diagnosis not present

## 2018-06-14 DIAGNOSIS — Z466 Encounter for fitting and adjustment of urinary device: Secondary | ICD-10-CM | POA: Diagnosis not present

## 2018-06-16 DIAGNOSIS — Z466 Encounter for fitting and adjustment of urinary device: Secondary | ICD-10-CM | POA: Diagnosis not present

## 2018-06-16 DIAGNOSIS — G834 Cauda equina syndrome: Secondary | ICD-10-CM | POA: Diagnosis not present

## 2018-06-16 DIAGNOSIS — Z7982 Long term (current) use of aspirin: Secondary | ICD-10-CM | POA: Diagnosis not present

## 2018-06-16 DIAGNOSIS — G8250 Quadriplegia, unspecified: Secondary | ICD-10-CM | POA: Diagnosis not present

## 2018-06-16 DIAGNOSIS — Z791 Long term (current) use of non-steroidal anti-inflammatories (NSAID): Secondary | ICD-10-CM | POA: Diagnosis not present

## 2018-06-19 DIAGNOSIS — G8250 Quadriplegia, unspecified: Secondary | ICD-10-CM | POA: Diagnosis not present

## 2018-06-19 DIAGNOSIS — Z791 Long term (current) use of non-steroidal anti-inflammatories (NSAID): Secondary | ICD-10-CM | POA: Diagnosis not present

## 2018-06-19 DIAGNOSIS — Z7982 Long term (current) use of aspirin: Secondary | ICD-10-CM | POA: Diagnosis not present

## 2018-06-19 DIAGNOSIS — G834 Cauda equina syndrome: Secondary | ICD-10-CM | POA: Diagnosis not present

## 2018-06-19 DIAGNOSIS — Z466 Encounter for fitting and adjustment of urinary device: Secondary | ICD-10-CM | POA: Diagnosis not present

## 2018-06-21 DIAGNOSIS — G8250 Quadriplegia, unspecified: Secondary | ICD-10-CM | POA: Diagnosis not present

## 2018-06-21 DIAGNOSIS — G834 Cauda equina syndrome: Secondary | ICD-10-CM | POA: Diagnosis not present

## 2018-06-21 DIAGNOSIS — Z7982 Long term (current) use of aspirin: Secondary | ICD-10-CM | POA: Diagnosis not present

## 2018-06-21 DIAGNOSIS — Z466 Encounter for fitting and adjustment of urinary device: Secondary | ICD-10-CM | POA: Diagnosis not present

## 2018-06-21 DIAGNOSIS — Z791 Long term (current) use of non-steroidal anti-inflammatories (NSAID): Secondary | ICD-10-CM | POA: Diagnosis not present

## 2018-06-23 DIAGNOSIS — Z791 Long term (current) use of non-steroidal anti-inflammatories (NSAID): Secondary | ICD-10-CM | POA: Diagnosis not present

## 2018-06-23 DIAGNOSIS — G8250 Quadriplegia, unspecified: Secondary | ICD-10-CM | POA: Diagnosis not present

## 2018-06-23 DIAGNOSIS — G834 Cauda equina syndrome: Secondary | ICD-10-CM | POA: Diagnosis not present

## 2018-06-23 DIAGNOSIS — Z466 Encounter for fitting and adjustment of urinary device: Secondary | ICD-10-CM | POA: Diagnosis not present

## 2018-06-23 DIAGNOSIS — Z7982 Long term (current) use of aspirin: Secondary | ICD-10-CM | POA: Diagnosis not present

## 2018-06-26 DIAGNOSIS — G834 Cauda equina syndrome: Secondary | ICD-10-CM | POA: Diagnosis not present

## 2018-06-26 DIAGNOSIS — Z466 Encounter for fitting and adjustment of urinary device: Secondary | ICD-10-CM | POA: Diagnosis not present

## 2018-06-26 DIAGNOSIS — G8250 Quadriplegia, unspecified: Secondary | ICD-10-CM | POA: Diagnosis not present

## 2018-06-26 DIAGNOSIS — Z791 Long term (current) use of non-steroidal anti-inflammatories (NSAID): Secondary | ICD-10-CM | POA: Diagnosis not present

## 2018-06-26 DIAGNOSIS — Z7982 Long term (current) use of aspirin: Secondary | ICD-10-CM | POA: Diagnosis not present

## 2018-06-28 DIAGNOSIS — L89159 Pressure ulcer of sacral region, unspecified stage: Secondary | ICD-10-CM | POA: Diagnosis not present

## 2018-06-28 DIAGNOSIS — Z466 Encounter for fitting and adjustment of urinary device: Secondary | ICD-10-CM | POA: Diagnosis not present

## 2018-06-28 DIAGNOSIS — G834 Cauda equina syndrome: Secondary | ICD-10-CM | POA: Diagnosis not present

## 2018-06-28 DIAGNOSIS — G8250 Quadriplegia, unspecified: Secondary | ICD-10-CM | POA: Diagnosis not present

## 2018-06-28 DIAGNOSIS — Z791 Long term (current) use of non-steroidal anti-inflammatories (NSAID): Secondary | ICD-10-CM | POA: Diagnosis not present

## 2018-06-28 DIAGNOSIS — Z993 Dependence on wheelchair: Secondary | ICD-10-CM | POA: Diagnosis not present

## 2018-06-28 DIAGNOSIS — Z7982 Long term (current) use of aspirin: Secondary | ICD-10-CM | POA: Diagnosis not present

## 2018-06-30 DIAGNOSIS — Z791 Long term (current) use of non-steroidal anti-inflammatories (NSAID): Secondary | ICD-10-CM | POA: Diagnosis not present

## 2018-06-30 DIAGNOSIS — G834 Cauda equina syndrome: Secondary | ICD-10-CM | POA: Diagnosis not present

## 2018-06-30 DIAGNOSIS — G8250 Quadriplegia, unspecified: Secondary | ICD-10-CM | POA: Diagnosis not present

## 2018-06-30 DIAGNOSIS — Z7982 Long term (current) use of aspirin: Secondary | ICD-10-CM | POA: Diagnosis not present

## 2018-06-30 DIAGNOSIS — Z466 Encounter for fitting and adjustment of urinary device: Secondary | ICD-10-CM | POA: Diagnosis not present

## 2018-07-03 DIAGNOSIS — Z791 Long term (current) use of non-steroidal anti-inflammatories (NSAID): Secondary | ICD-10-CM | POA: Diagnosis not present

## 2018-07-03 DIAGNOSIS — Z7982 Long term (current) use of aspirin: Secondary | ICD-10-CM | POA: Diagnosis not present

## 2018-07-03 DIAGNOSIS — Z466 Encounter for fitting and adjustment of urinary device: Secondary | ICD-10-CM | POA: Diagnosis not present

## 2018-07-03 DIAGNOSIS — G8250 Quadriplegia, unspecified: Secondary | ICD-10-CM | POA: Diagnosis not present

## 2018-07-03 DIAGNOSIS — G834 Cauda equina syndrome: Secondary | ICD-10-CM | POA: Diagnosis not present

## 2018-07-03 DIAGNOSIS — Z993 Dependence on wheelchair: Secondary | ICD-10-CM | POA: Diagnosis not present

## 2018-07-03 DIAGNOSIS — L89159 Pressure ulcer of sacral region, unspecified stage: Secondary | ICD-10-CM | POA: Diagnosis not present

## 2018-07-05 DIAGNOSIS — Z7982 Long term (current) use of aspirin: Secondary | ICD-10-CM | POA: Diagnosis not present

## 2018-07-05 DIAGNOSIS — G834 Cauda equina syndrome: Secondary | ICD-10-CM | POA: Diagnosis not present

## 2018-07-05 DIAGNOSIS — Z466 Encounter for fitting and adjustment of urinary device: Secondary | ICD-10-CM | POA: Diagnosis not present

## 2018-07-05 DIAGNOSIS — G8250 Quadriplegia, unspecified: Secondary | ICD-10-CM | POA: Diagnosis not present

## 2018-07-05 DIAGNOSIS — Z791 Long term (current) use of non-steroidal anti-inflammatories (NSAID): Secondary | ICD-10-CM | POA: Diagnosis not present

## 2018-07-06 DIAGNOSIS — Z791 Long term (current) use of non-steroidal anti-inflammatories (NSAID): Secondary | ICD-10-CM | POA: Diagnosis not present

## 2018-07-06 DIAGNOSIS — G834 Cauda equina syndrome: Secondary | ICD-10-CM | POA: Diagnosis not present

## 2018-07-06 DIAGNOSIS — Z466 Encounter for fitting and adjustment of urinary device: Secondary | ICD-10-CM | POA: Diagnosis not present

## 2018-07-06 DIAGNOSIS — G8250 Quadriplegia, unspecified: Secondary | ICD-10-CM | POA: Diagnosis not present

## 2018-07-06 DIAGNOSIS — Z7982 Long term (current) use of aspirin: Secondary | ICD-10-CM | POA: Diagnosis not present

## 2018-07-07 DIAGNOSIS — Z7982 Long term (current) use of aspirin: Secondary | ICD-10-CM | POA: Diagnosis not present

## 2018-07-07 DIAGNOSIS — Z791 Long term (current) use of non-steroidal anti-inflammatories (NSAID): Secondary | ICD-10-CM | POA: Diagnosis not present

## 2018-07-07 DIAGNOSIS — Z466 Encounter for fitting and adjustment of urinary device: Secondary | ICD-10-CM | POA: Diagnosis not present

## 2018-07-07 DIAGNOSIS — G834 Cauda equina syndrome: Secondary | ICD-10-CM | POA: Diagnosis not present

## 2018-07-07 DIAGNOSIS — G8250 Quadriplegia, unspecified: Secondary | ICD-10-CM | POA: Diagnosis not present

## 2018-07-10 DIAGNOSIS — G8250 Quadriplegia, unspecified: Secondary | ICD-10-CM | POA: Diagnosis not present

## 2018-07-10 DIAGNOSIS — Z7982 Long term (current) use of aspirin: Secondary | ICD-10-CM | POA: Diagnosis not present

## 2018-07-10 DIAGNOSIS — Z791 Long term (current) use of non-steroidal anti-inflammatories (NSAID): Secondary | ICD-10-CM | POA: Diagnosis not present

## 2018-07-10 DIAGNOSIS — Z466 Encounter for fitting and adjustment of urinary device: Secondary | ICD-10-CM | POA: Diagnosis not present

## 2018-07-10 DIAGNOSIS — G834 Cauda equina syndrome: Secondary | ICD-10-CM | POA: Diagnosis not present

## 2018-07-12 DIAGNOSIS — Z791 Long term (current) use of non-steroidal anti-inflammatories (NSAID): Secondary | ICD-10-CM | POA: Diagnosis not present

## 2018-07-12 DIAGNOSIS — G8250 Quadriplegia, unspecified: Secondary | ICD-10-CM | POA: Diagnosis not present

## 2018-07-12 DIAGNOSIS — Z466 Encounter for fitting and adjustment of urinary device: Secondary | ICD-10-CM | POA: Diagnosis not present

## 2018-07-12 DIAGNOSIS — G834 Cauda equina syndrome: Secondary | ICD-10-CM | POA: Diagnosis not present

## 2018-07-12 DIAGNOSIS — Z7982 Long term (current) use of aspirin: Secondary | ICD-10-CM | POA: Diagnosis not present

## 2018-07-14 DIAGNOSIS — Z7982 Long term (current) use of aspirin: Secondary | ICD-10-CM | POA: Diagnosis not present

## 2018-07-14 DIAGNOSIS — Z791 Long term (current) use of non-steroidal anti-inflammatories (NSAID): Secondary | ICD-10-CM | POA: Diagnosis not present

## 2018-07-14 DIAGNOSIS — G8250 Quadriplegia, unspecified: Secondary | ICD-10-CM | POA: Diagnosis not present

## 2018-07-14 DIAGNOSIS — Z466 Encounter for fitting and adjustment of urinary device: Secondary | ICD-10-CM | POA: Diagnosis not present

## 2018-07-14 DIAGNOSIS — G834 Cauda equina syndrome: Secondary | ICD-10-CM | POA: Diagnosis not present

## 2018-07-17 DIAGNOSIS — Z466 Encounter for fitting and adjustment of urinary device: Secondary | ICD-10-CM | POA: Diagnosis not present

## 2018-07-17 DIAGNOSIS — G834 Cauda equina syndrome: Secondary | ICD-10-CM | POA: Diagnosis not present

## 2018-07-17 DIAGNOSIS — G8250 Quadriplegia, unspecified: Secondary | ICD-10-CM | POA: Diagnosis not present

## 2018-07-17 DIAGNOSIS — Z791 Long term (current) use of non-steroidal anti-inflammatories (NSAID): Secondary | ICD-10-CM | POA: Diagnosis not present

## 2018-07-17 DIAGNOSIS — Z7982 Long term (current) use of aspirin: Secondary | ICD-10-CM | POA: Diagnosis not present

## 2018-07-19 DIAGNOSIS — Z7982 Long term (current) use of aspirin: Secondary | ICD-10-CM | POA: Diagnosis not present

## 2018-07-19 DIAGNOSIS — Z791 Long term (current) use of non-steroidal anti-inflammatories (NSAID): Secondary | ICD-10-CM | POA: Diagnosis not present

## 2018-07-19 DIAGNOSIS — G8250 Quadriplegia, unspecified: Secondary | ICD-10-CM | POA: Diagnosis not present

## 2018-07-19 DIAGNOSIS — G834 Cauda equina syndrome: Secondary | ICD-10-CM | POA: Diagnosis not present

## 2018-07-19 DIAGNOSIS — Z466 Encounter for fitting and adjustment of urinary device: Secondary | ICD-10-CM | POA: Diagnosis not present

## 2018-07-21 DIAGNOSIS — G834 Cauda equina syndrome: Secondary | ICD-10-CM | POA: Diagnosis not present

## 2018-07-21 DIAGNOSIS — G8250 Quadriplegia, unspecified: Secondary | ICD-10-CM | POA: Diagnosis not present

## 2018-07-21 DIAGNOSIS — Z791 Long term (current) use of non-steroidal anti-inflammatories (NSAID): Secondary | ICD-10-CM | POA: Diagnosis not present

## 2018-07-21 DIAGNOSIS — Z7982 Long term (current) use of aspirin: Secondary | ICD-10-CM | POA: Diagnosis not present

## 2018-07-21 DIAGNOSIS — Z466 Encounter for fitting and adjustment of urinary device: Secondary | ICD-10-CM | POA: Diagnosis not present

## 2018-07-24 DIAGNOSIS — Z7982 Long term (current) use of aspirin: Secondary | ICD-10-CM | POA: Diagnosis not present

## 2018-07-24 DIAGNOSIS — G8250 Quadriplegia, unspecified: Secondary | ICD-10-CM | POA: Diagnosis not present

## 2018-07-24 DIAGNOSIS — G834 Cauda equina syndrome: Secondary | ICD-10-CM | POA: Diagnosis not present

## 2018-07-24 DIAGNOSIS — Z791 Long term (current) use of non-steroidal anti-inflammatories (NSAID): Secondary | ICD-10-CM | POA: Diagnosis not present

## 2018-07-24 DIAGNOSIS — Z466 Encounter for fitting and adjustment of urinary device: Secondary | ICD-10-CM | POA: Diagnosis not present

## 2018-07-25 DIAGNOSIS — Z791 Long term (current) use of non-steroidal anti-inflammatories (NSAID): Secondary | ICD-10-CM | POA: Diagnosis not present

## 2018-07-25 DIAGNOSIS — Z466 Encounter for fitting and adjustment of urinary device: Secondary | ICD-10-CM | POA: Diagnosis not present

## 2018-07-25 DIAGNOSIS — Z7982 Long term (current) use of aspirin: Secondary | ICD-10-CM | POA: Diagnosis not present

## 2018-07-25 DIAGNOSIS — G8250 Quadriplegia, unspecified: Secondary | ICD-10-CM | POA: Diagnosis not present

## 2018-07-25 DIAGNOSIS — G834 Cauda equina syndrome: Secondary | ICD-10-CM | POA: Diagnosis not present

## 2018-07-26 DIAGNOSIS — Z993 Dependence on wheelchair: Secondary | ICD-10-CM | POA: Diagnosis not present

## 2018-07-26 DIAGNOSIS — Z7982 Long term (current) use of aspirin: Secondary | ICD-10-CM | POA: Diagnosis not present

## 2018-07-26 DIAGNOSIS — G8250 Quadriplegia, unspecified: Secondary | ICD-10-CM | POA: Diagnosis not present

## 2018-07-26 DIAGNOSIS — L89159 Pressure ulcer of sacral region, unspecified stage: Secondary | ICD-10-CM | POA: Diagnosis not present

## 2018-07-26 DIAGNOSIS — Z466 Encounter for fitting and adjustment of urinary device: Secondary | ICD-10-CM | POA: Diagnosis not present

## 2018-07-26 DIAGNOSIS — Z791 Long term (current) use of non-steroidal anti-inflammatories (NSAID): Secondary | ICD-10-CM | POA: Diagnosis not present

## 2018-07-26 DIAGNOSIS — G834 Cauda equina syndrome: Secondary | ICD-10-CM | POA: Diagnosis not present

## 2018-07-28 DIAGNOSIS — Z466 Encounter for fitting and adjustment of urinary device: Secondary | ICD-10-CM | POA: Diagnosis not present

## 2018-07-28 DIAGNOSIS — Z791 Long term (current) use of non-steroidal anti-inflammatories (NSAID): Secondary | ICD-10-CM | POA: Diagnosis not present

## 2018-07-28 DIAGNOSIS — G834 Cauda equina syndrome: Secondary | ICD-10-CM | POA: Diagnosis not present

## 2018-07-28 DIAGNOSIS — G8250 Quadriplegia, unspecified: Secondary | ICD-10-CM | POA: Diagnosis not present

## 2018-07-28 DIAGNOSIS — Z7982 Long term (current) use of aspirin: Secondary | ICD-10-CM | POA: Diagnosis not present

## 2018-08-01 DIAGNOSIS — Z466 Encounter for fitting and adjustment of urinary device: Secondary | ICD-10-CM | POA: Diagnosis not present

## 2018-08-01 DIAGNOSIS — G834 Cauda equina syndrome: Secondary | ICD-10-CM | POA: Diagnosis not present

## 2018-08-01 DIAGNOSIS — Z7982 Long term (current) use of aspirin: Secondary | ICD-10-CM | POA: Diagnosis not present

## 2018-08-01 DIAGNOSIS — G8250 Quadriplegia, unspecified: Secondary | ICD-10-CM | POA: Diagnosis not present

## 2018-08-01 DIAGNOSIS — Z791 Long term (current) use of non-steroidal anti-inflammatories (NSAID): Secondary | ICD-10-CM | POA: Diagnosis not present

## 2018-08-02 DIAGNOSIS — G8250 Quadriplegia, unspecified: Secondary | ICD-10-CM | POA: Diagnosis not present

## 2018-08-02 DIAGNOSIS — Z791 Long term (current) use of non-steroidal anti-inflammatories (NSAID): Secondary | ICD-10-CM | POA: Diagnosis not present

## 2018-08-02 DIAGNOSIS — Z466 Encounter for fitting and adjustment of urinary device: Secondary | ICD-10-CM | POA: Diagnosis not present

## 2018-08-02 DIAGNOSIS — G834 Cauda equina syndrome: Secondary | ICD-10-CM | POA: Diagnosis not present

## 2018-08-02 DIAGNOSIS — Z7982 Long term (current) use of aspirin: Secondary | ICD-10-CM | POA: Diagnosis not present

## 2018-08-04 DIAGNOSIS — Z791 Long term (current) use of non-steroidal anti-inflammatories (NSAID): Secondary | ICD-10-CM | POA: Diagnosis not present

## 2018-08-04 DIAGNOSIS — Z466 Encounter for fitting and adjustment of urinary device: Secondary | ICD-10-CM | POA: Diagnosis not present

## 2018-08-04 DIAGNOSIS — Z7982 Long term (current) use of aspirin: Secondary | ICD-10-CM | POA: Diagnosis not present

## 2018-08-04 DIAGNOSIS — G834 Cauda equina syndrome: Secondary | ICD-10-CM | POA: Diagnosis not present

## 2018-08-04 DIAGNOSIS — G8250 Quadriplegia, unspecified: Secondary | ICD-10-CM | POA: Diagnosis not present

## 2018-08-07 DIAGNOSIS — Z791 Long term (current) use of non-steroidal anti-inflammatories (NSAID): Secondary | ICD-10-CM | POA: Diagnosis not present

## 2018-08-07 DIAGNOSIS — G834 Cauda equina syndrome: Secondary | ICD-10-CM | POA: Diagnosis not present

## 2018-08-07 DIAGNOSIS — Z466 Encounter for fitting and adjustment of urinary device: Secondary | ICD-10-CM | POA: Diagnosis not present

## 2018-08-07 DIAGNOSIS — Z7982 Long term (current) use of aspirin: Secondary | ICD-10-CM | POA: Diagnosis not present

## 2018-08-07 DIAGNOSIS — G8250 Quadriplegia, unspecified: Secondary | ICD-10-CM | POA: Diagnosis not present

## 2018-08-08 DIAGNOSIS — Z7982 Long term (current) use of aspirin: Secondary | ICD-10-CM | POA: Diagnosis not present

## 2018-08-08 DIAGNOSIS — Z791 Long term (current) use of non-steroidal anti-inflammatories (NSAID): Secondary | ICD-10-CM | POA: Diagnosis not present

## 2018-08-08 DIAGNOSIS — G834 Cauda equina syndrome: Secondary | ICD-10-CM | POA: Diagnosis not present

## 2018-08-08 DIAGNOSIS — Z466 Encounter for fitting and adjustment of urinary device: Secondary | ICD-10-CM | POA: Diagnosis not present

## 2018-08-08 DIAGNOSIS — G8250 Quadriplegia, unspecified: Secondary | ICD-10-CM | POA: Diagnosis not present

## 2018-08-09 DIAGNOSIS — Z466 Encounter for fitting and adjustment of urinary device: Secondary | ICD-10-CM | POA: Diagnosis not present

## 2018-08-09 DIAGNOSIS — G834 Cauda equina syndrome: Secondary | ICD-10-CM | POA: Diagnosis not present

## 2018-08-09 DIAGNOSIS — Z993 Dependence on wheelchair: Secondary | ICD-10-CM | POA: Diagnosis not present

## 2018-08-09 DIAGNOSIS — Z791 Long term (current) use of non-steroidal anti-inflammatories (NSAID): Secondary | ICD-10-CM | POA: Diagnosis not present

## 2018-08-09 DIAGNOSIS — Z7982 Long term (current) use of aspirin: Secondary | ICD-10-CM | POA: Diagnosis not present

## 2018-08-09 DIAGNOSIS — L89159 Pressure ulcer of sacral region, unspecified stage: Secondary | ICD-10-CM | POA: Diagnosis not present

## 2018-08-09 DIAGNOSIS — G8250 Quadriplegia, unspecified: Secondary | ICD-10-CM | POA: Diagnosis not present

## 2018-08-11 DIAGNOSIS — Z7982 Long term (current) use of aspirin: Secondary | ICD-10-CM | POA: Diagnosis not present

## 2018-08-11 DIAGNOSIS — Z466 Encounter for fitting and adjustment of urinary device: Secondary | ICD-10-CM | POA: Diagnosis not present

## 2018-08-11 DIAGNOSIS — G834 Cauda equina syndrome: Secondary | ICD-10-CM | POA: Diagnosis not present

## 2018-08-11 DIAGNOSIS — G8250 Quadriplegia, unspecified: Secondary | ICD-10-CM | POA: Diagnosis not present

## 2018-08-11 DIAGNOSIS — Z791 Long term (current) use of non-steroidal anti-inflammatories (NSAID): Secondary | ICD-10-CM | POA: Diagnosis not present

## 2018-08-13 DIAGNOSIS — Z7982 Long term (current) use of aspirin: Secondary | ICD-10-CM | POA: Diagnosis not present

## 2018-08-13 DIAGNOSIS — Z466 Encounter for fitting and adjustment of urinary device: Secondary | ICD-10-CM | POA: Diagnosis not present

## 2018-08-13 DIAGNOSIS — Z791 Long term (current) use of non-steroidal anti-inflammatories (NSAID): Secondary | ICD-10-CM | POA: Diagnosis not present

## 2018-08-13 DIAGNOSIS — G8250 Quadriplegia, unspecified: Secondary | ICD-10-CM | POA: Diagnosis not present

## 2018-08-13 DIAGNOSIS — G834 Cauda equina syndrome: Secondary | ICD-10-CM | POA: Diagnosis not present

## 2018-08-14 DIAGNOSIS — G8250 Quadriplegia, unspecified: Secondary | ICD-10-CM | POA: Diagnosis not present

## 2018-08-14 DIAGNOSIS — Z993 Dependence on wheelchair: Secondary | ICD-10-CM | POA: Diagnosis not present

## 2018-08-14 DIAGNOSIS — G834 Cauda equina syndrome: Secondary | ICD-10-CM | POA: Diagnosis not present

## 2018-08-14 DIAGNOSIS — L89159 Pressure ulcer of sacral region, unspecified stage: Secondary | ICD-10-CM | POA: Diagnosis not present

## 2018-08-14 DIAGNOSIS — Z791 Long term (current) use of non-steroidal anti-inflammatories (NSAID): Secondary | ICD-10-CM | POA: Diagnosis not present

## 2018-08-14 DIAGNOSIS — Z466 Encounter for fitting and adjustment of urinary device: Secondary | ICD-10-CM | POA: Diagnosis not present

## 2018-08-14 DIAGNOSIS — Z7982 Long term (current) use of aspirin: Secondary | ICD-10-CM | POA: Diagnosis not present

## 2018-08-16 DIAGNOSIS — Z791 Long term (current) use of non-steroidal anti-inflammatories (NSAID): Secondary | ICD-10-CM | POA: Diagnosis not present

## 2018-08-16 DIAGNOSIS — Z466 Encounter for fitting and adjustment of urinary device: Secondary | ICD-10-CM | POA: Diagnosis not present

## 2018-08-16 DIAGNOSIS — G8250 Quadriplegia, unspecified: Secondary | ICD-10-CM | POA: Diagnosis not present

## 2018-08-16 DIAGNOSIS — Z7982 Long term (current) use of aspirin: Secondary | ICD-10-CM | POA: Diagnosis not present

## 2018-08-16 DIAGNOSIS — G834 Cauda equina syndrome: Secondary | ICD-10-CM | POA: Diagnosis not present

## 2018-08-18 DIAGNOSIS — G834 Cauda equina syndrome: Secondary | ICD-10-CM | POA: Diagnosis not present

## 2018-08-18 DIAGNOSIS — G8250 Quadriplegia, unspecified: Secondary | ICD-10-CM | POA: Diagnosis not present

## 2018-08-18 DIAGNOSIS — Z791 Long term (current) use of non-steroidal anti-inflammatories (NSAID): Secondary | ICD-10-CM | POA: Diagnosis not present

## 2018-08-18 DIAGNOSIS — Z466 Encounter for fitting and adjustment of urinary device: Secondary | ICD-10-CM | POA: Diagnosis not present

## 2018-08-18 DIAGNOSIS — Z7982 Long term (current) use of aspirin: Secondary | ICD-10-CM | POA: Diagnosis not present

## 2018-08-21 DIAGNOSIS — G8250 Quadriplegia, unspecified: Secondary | ICD-10-CM | POA: Diagnosis not present

## 2018-08-21 DIAGNOSIS — Z7982 Long term (current) use of aspirin: Secondary | ICD-10-CM | POA: Diagnosis not present

## 2018-08-21 DIAGNOSIS — G834 Cauda equina syndrome: Secondary | ICD-10-CM | POA: Diagnosis not present

## 2018-08-21 DIAGNOSIS — Z466 Encounter for fitting and adjustment of urinary device: Secondary | ICD-10-CM | POA: Diagnosis not present

## 2018-08-21 DIAGNOSIS — Z791 Long term (current) use of non-steroidal anti-inflammatories (NSAID): Secondary | ICD-10-CM | POA: Diagnosis not present

## 2018-08-21 DIAGNOSIS — Z79891 Long term (current) use of opiate analgesic: Secondary | ICD-10-CM | POA: Diagnosis not present

## 2018-08-21 DIAGNOSIS — L89152 Pressure ulcer of sacral region, stage 2: Secondary | ICD-10-CM | POA: Diagnosis not present

## 2018-08-23 DIAGNOSIS — Z791 Long term (current) use of non-steroidal anti-inflammatories (NSAID): Secondary | ICD-10-CM | POA: Diagnosis not present

## 2018-08-23 DIAGNOSIS — G8250 Quadriplegia, unspecified: Secondary | ICD-10-CM | POA: Diagnosis not present

## 2018-08-23 DIAGNOSIS — Z7982 Long term (current) use of aspirin: Secondary | ICD-10-CM | POA: Diagnosis not present

## 2018-08-23 DIAGNOSIS — Z466 Encounter for fitting and adjustment of urinary device: Secondary | ICD-10-CM | POA: Diagnosis not present

## 2018-08-23 DIAGNOSIS — Z79891 Long term (current) use of opiate analgesic: Secondary | ICD-10-CM | POA: Diagnosis not present

## 2018-08-23 DIAGNOSIS — L89152 Pressure ulcer of sacral region, stage 2: Secondary | ICD-10-CM | POA: Diagnosis not present

## 2018-08-23 DIAGNOSIS — G834 Cauda equina syndrome: Secondary | ICD-10-CM | POA: Diagnosis not present

## 2018-08-25 DIAGNOSIS — Z466 Encounter for fitting and adjustment of urinary device: Secondary | ICD-10-CM | POA: Diagnosis not present

## 2018-08-25 DIAGNOSIS — G834 Cauda equina syndrome: Secondary | ICD-10-CM | POA: Diagnosis not present

## 2018-08-25 DIAGNOSIS — Z79891 Long term (current) use of opiate analgesic: Secondary | ICD-10-CM | POA: Diagnosis not present

## 2018-08-25 DIAGNOSIS — Z791 Long term (current) use of non-steroidal anti-inflammatories (NSAID): Secondary | ICD-10-CM | POA: Diagnosis not present

## 2018-08-25 DIAGNOSIS — G8250 Quadriplegia, unspecified: Secondary | ICD-10-CM | POA: Diagnosis not present

## 2018-08-25 DIAGNOSIS — Z7982 Long term (current) use of aspirin: Secondary | ICD-10-CM | POA: Diagnosis not present

## 2018-08-25 DIAGNOSIS — L89152 Pressure ulcer of sacral region, stage 2: Secondary | ICD-10-CM | POA: Diagnosis not present

## 2018-08-28 DIAGNOSIS — L89152 Pressure ulcer of sacral region, stage 2: Secondary | ICD-10-CM | POA: Diagnosis not present

## 2018-08-28 DIAGNOSIS — Z7982 Long term (current) use of aspirin: Secondary | ICD-10-CM | POA: Diagnosis not present

## 2018-08-28 DIAGNOSIS — Z791 Long term (current) use of non-steroidal anti-inflammatories (NSAID): Secondary | ICD-10-CM | POA: Diagnosis not present

## 2018-08-28 DIAGNOSIS — Z79891 Long term (current) use of opiate analgesic: Secondary | ICD-10-CM | POA: Diagnosis not present

## 2018-08-28 DIAGNOSIS — G834 Cauda equina syndrome: Secondary | ICD-10-CM | POA: Diagnosis not present

## 2018-08-28 DIAGNOSIS — G8250 Quadriplegia, unspecified: Secondary | ICD-10-CM | POA: Diagnosis not present

## 2018-08-28 DIAGNOSIS — Z466 Encounter for fitting and adjustment of urinary device: Secondary | ICD-10-CM | POA: Diagnosis not present

## 2018-08-30 DIAGNOSIS — L89152 Pressure ulcer of sacral region, stage 2: Secondary | ICD-10-CM | POA: Diagnosis not present

## 2018-08-30 DIAGNOSIS — Z466 Encounter for fitting and adjustment of urinary device: Secondary | ICD-10-CM | POA: Diagnosis not present

## 2018-08-30 DIAGNOSIS — Z79891 Long term (current) use of opiate analgesic: Secondary | ICD-10-CM | POA: Diagnosis not present

## 2018-08-30 DIAGNOSIS — G834 Cauda equina syndrome: Secondary | ICD-10-CM | POA: Diagnosis not present

## 2018-08-30 DIAGNOSIS — Z7982 Long term (current) use of aspirin: Secondary | ICD-10-CM | POA: Diagnosis not present

## 2018-08-30 DIAGNOSIS — Z791 Long term (current) use of non-steroidal anti-inflammatories (NSAID): Secondary | ICD-10-CM | POA: Diagnosis not present

## 2018-08-30 DIAGNOSIS — G8250 Quadriplegia, unspecified: Secondary | ICD-10-CM | POA: Diagnosis not present

## 2018-09-01 DIAGNOSIS — G8250 Quadriplegia, unspecified: Secondary | ICD-10-CM | POA: Diagnosis not present

## 2018-09-01 DIAGNOSIS — Z7982 Long term (current) use of aspirin: Secondary | ICD-10-CM | POA: Diagnosis not present

## 2018-09-01 DIAGNOSIS — Z791 Long term (current) use of non-steroidal anti-inflammatories (NSAID): Secondary | ICD-10-CM | POA: Diagnosis not present

## 2018-09-01 DIAGNOSIS — Z79891 Long term (current) use of opiate analgesic: Secondary | ICD-10-CM | POA: Diagnosis not present

## 2018-09-01 DIAGNOSIS — G834 Cauda equina syndrome: Secondary | ICD-10-CM | POA: Diagnosis not present

## 2018-09-01 DIAGNOSIS — Z466 Encounter for fitting and adjustment of urinary device: Secondary | ICD-10-CM | POA: Diagnosis not present

## 2018-09-01 DIAGNOSIS — L89152 Pressure ulcer of sacral region, stage 2: Secondary | ICD-10-CM | POA: Diagnosis not present

## 2018-09-04 DIAGNOSIS — Z7982 Long term (current) use of aspirin: Secondary | ICD-10-CM | POA: Diagnosis not present

## 2018-09-04 DIAGNOSIS — G834 Cauda equina syndrome: Secondary | ICD-10-CM | POA: Diagnosis not present

## 2018-09-04 DIAGNOSIS — Z79891 Long term (current) use of opiate analgesic: Secondary | ICD-10-CM | POA: Diagnosis not present

## 2018-09-04 DIAGNOSIS — Z791 Long term (current) use of non-steroidal anti-inflammatories (NSAID): Secondary | ICD-10-CM | POA: Diagnosis not present

## 2018-09-04 DIAGNOSIS — G8250 Quadriplegia, unspecified: Secondary | ICD-10-CM | POA: Diagnosis not present

## 2018-09-04 DIAGNOSIS — L89152 Pressure ulcer of sacral region, stage 2: Secondary | ICD-10-CM | POA: Diagnosis not present

## 2018-09-04 DIAGNOSIS — Z466 Encounter for fitting and adjustment of urinary device: Secondary | ICD-10-CM | POA: Diagnosis not present

## 2018-09-05 DIAGNOSIS — Z791 Long term (current) use of non-steroidal anti-inflammatories (NSAID): Secondary | ICD-10-CM | POA: Diagnosis not present

## 2018-09-05 DIAGNOSIS — L89152 Pressure ulcer of sacral region, stage 2: Secondary | ICD-10-CM | POA: Diagnosis not present

## 2018-09-05 DIAGNOSIS — Z7982 Long term (current) use of aspirin: Secondary | ICD-10-CM | POA: Diagnosis not present

## 2018-09-05 DIAGNOSIS — G8250 Quadriplegia, unspecified: Secondary | ICD-10-CM | POA: Diagnosis not present

## 2018-09-05 DIAGNOSIS — Z466 Encounter for fitting and adjustment of urinary device: Secondary | ICD-10-CM | POA: Diagnosis not present

## 2018-09-05 DIAGNOSIS — Z79891 Long term (current) use of opiate analgesic: Secondary | ICD-10-CM | POA: Diagnosis not present

## 2018-09-05 DIAGNOSIS — G834 Cauda equina syndrome: Secondary | ICD-10-CM | POA: Diagnosis not present

## 2018-09-06 DIAGNOSIS — Z79891 Long term (current) use of opiate analgesic: Secondary | ICD-10-CM | POA: Diagnosis not present

## 2018-09-06 DIAGNOSIS — Z791 Long term (current) use of non-steroidal anti-inflammatories (NSAID): Secondary | ICD-10-CM | POA: Diagnosis not present

## 2018-09-06 DIAGNOSIS — Z466 Encounter for fitting and adjustment of urinary device: Secondary | ICD-10-CM | POA: Diagnosis not present

## 2018-09-06 DIAGNOSIS — L89159 Pressure ulcer of sacral region, unspecified stage: Secondary | ICD-10-CM | POA: Diagnosis not present

## 2018-09-06 DIAGNOSIS — G834 Cauda equina syndrome: Secondary | ICD-10-CM | POA: Diagnosis not present

## 2018-09-06 DIAGNOSIS — Z7982 Long term (current) use of aspirin: Secondary | ICD-10-CM | POA: Diagnosis not present

## 2018-09-06 DIAGNOSIS — Z993 Dependence on wheelchair: Secondary | ICD-10-CM | POA: Diagnosis not present

## 2018-09-06 DIAGNOSIS — L89152 Pressure ulcer of sacral region, stage 2: Secondary | ICD-10-CM | POA: Diagnosis not present

## 2018-09-06 DIAGNOSIS — G8250 Quadriplegia, unspecified: Secondary | ICD-10-CM | POA: Diagnosis not present

## 2018-09-07 DIAGNOSIS — L89152 Pressure ulcer of sacral region, stage 2: Secondary | ICD-10-CM | POA: Diagnosis not present

## 2018-09-07 DIAGNOSIS — Z79891 Long term (current) use of opiate analgesic: Secondary | ICD-10-CM | POA: Diagnosis not present

## 2018-09-07 DIAGNOSIS — Z7982 Long term (current) use of aspirin: Secondary | ICD-10-CM | POA: Diagnosis not present

## 2018-09-07 DIAGNOSIS — G834 Cauda equina syndrome: Secondary | ICD-10-CM | POA: Diagnosis not present

## 2018-09-07 DIAGNOSIS — G8250 Quadriplegia, unspecified: Secondary | ICD-10-CM | POA: Diagnosis not present

## 2018-09-07 DIAGNOSIS — Z791 Long term (current) use of non-steroidal anti-inflammatories (NSAID): Secondary | ICD-10-CM | POA: Diagnosis not present

## 2018-09-07 DIAGNOSIS — Z466 Encounter for fitting and adjustment of urinary device: Secondary | ICD-10-CM | POA: Diagnosis not present

## 2018-09-11 DIAGNOSIS — L89152 Pressure ulcer of sacral region, stage 2: Secondary | ICD-10-CM | POA: Diagnosis not present

## 2018-09-11 DIAGNOSIS — Z791 Long term (current) use of non-steroidal anti-inflammatories (NSAID): Secondary | ICD-10-CM | POA: Diagnosis not present

## 2018-09-11 DIAGNOSIS — Z79891 Long term (current) use of opiate analgesic: Secondary | ICD-10-CM | POA: Diagnosis not present

## 2018-09-11 DIAGNOSIS — G8250 Quadriplegia, unspecified: Secondary | ICD-10-CM | POA: Diagnosis not present

## 2018-09-11 DIAGNOSIS — G834 Cauda equina syndrome: Secondary | ICD-10-CM | POA: Diagnosis not present

## 2018-09-11 DIAGNOSIS — Z7982 Long term (current) use of aspirin: Secondary | ICD-10-CM | POA: Diagnosis not present

## 2018-09-11 DIAGNOSIS — Z466 Encounter for fitting and adjustment of urinary device: Secondary | ICD-10-CM | POA: Diagnosis not present

## 2018-09-13 DIAGNOSIS — G8250 Quadriplegia, unspecified: Secondary | ICD-10-CM | POA: Diagnosis not present

## 2018-09-13 DIAGNOSIS — Z791 Long term (current) use of non-steroidal anti-inflammatories (NSAID): Secondary | ICD-10-CM | POA: Diagnosis not present

## 2018-09-13 DIAGNOSIS — G834 Cauda equina syndrome: Secondary | ICD-10-CM | POA: Diagnosis not present

## 2018-09-13 DIAGNOSIS — Z79891 Long term (current) use of opiate analgesic: Secondary | ICD-10-CM | POA: Diagnosis not present

## 2018-09-13 DIAGNOSIS — L89152 Pressure ulcer of sacral region, stage 2: Secondary | ICD-10-CM | POA: Diagnosis not present

## 2018-09-13 DIAGNOSIS — Z7982 Long term (current) use of aspirin: Secondary | ICD-10-CM | POA: Diagnosis not present

## 2018-09-13 DIAGNOSIS — Z466 Encounter for fitting and adjustment of urinary device: Secondary | ICD-10-CM | POA: Diagnosis not present

## 2018-09-15 DIAGNOSIS — G8250 Quadriplegia, unspecified: Secondary | ICD-10-CM | POA: Diagnosis not present

## 2018-09-15 DIAGNOSIS — L89152 Pressure ulcer of sacral region, stage 2: Secondary | ICD-10-CM | POA: Diagnosis not present

## 2018-09-15 DIAGNOSIS — Z466 Encounter for fitting and adjustment of urinary device: Secondary | ICD-10-CM | POA: Diagnosis not present

## 2018-09-15 DIAGNOSIS — Z79891 Long term (current) use of opiate analgesic: Secondary | ICD-10-CM | POA: Diagnosis not present

## 2018-09-15 DIAGNOSIS — G834 Cauda equina syndrome: Secondary | ICD-10-CM | POA: Diagnosis not present

## 2018-09-15 DIAGNOSIS — Z791 Long term (current) use of non-steroidal anti-inflammatories (NSAID): Secondary | ICD-10-CM | POA: Diagnosis not present

## 2018-09-15 DIAGNOSIS — Z7982 Long term (current) use of aspirin: Secondary | ICD-10-CM | POA: Diagnosis not present

## 2018-09-18 DIAGNOSIS — L89152 Pressure ulcer of sacral region, stage 2: Secondary | ICD-10-CM | POA: Diagnosis not present

## 2018-09-18 DIAGNOSIS — G834 Cauda equina syndrome: Secondary | ICD-10-CM | POA: Diagnosis not present

## 2018-09-18 DIAGNOSIS — Z466 Encounter for fitting and adjustment of urinary device: Secondary | ICD-10-CM | POA: Diagnosis not present

## 2018-09-18 DIAGNOSIS — Z79891 Long term (current) use of opiate analgesic: Secondary | ICD-10-CM | POA: Diagnosis not present

## 2018-09-18 DIAGNOSIS — Z7982 Long term (current) use of aspirin: Secondary | ICD-10-CM | POA: Diagnosis not present

## 2018-09-18 DIAGNOSIS — Z791 Long term (current) use of non-steroidal anti-inflammatories (NSAID): Secondary | ICD-10-CM | POA: Diagnosis not present

## 2018-09-18 DIAGNOSIS — G8250 Quadriplegia, unspecified: Secondary | ICD-10-CM | POA: Diagnosis not present

## 2018-09-20 DIAGNOSIS — G8250 Quadriplegia, unspecified: Secondary | ICD-10-CM | POA: Diagnosis not present

## 2018-09-20 DIAGNOSIS — L89152 Pressure ulcer of sacral region, stage 2: Secondary | ICD-10-CM | POA: Diagnosis not present

## 2018-09-20 DIAGNOSIS — Z791 Long term (current) use of non-steroidal anti-inflammatories (NSAID): Secondary | ICD-10-CM | POA: Diagnosis not present

## 2018-09-20 DIAGNOSIS — G834 Cauda equina syndrome: Secondary | ICD-10-CM | POA: Diagnosis not present

## 2018-09-20 DIAGNOSIS — Z79891 Long term (current) use of opiate analgesic: Secondary | ICD-10-CM | POA: Diagnosis not present

## 2018-09-20 DIAGNOSIS — Z466 Encounter for fitting and adjustment of urinary device: Secondary | ICD-10-CM | POA: Diagnosis not present

## 2018-09-20 DIAGNOSIS — Z7982 Long term (current) use of aspirin: Secondary | ICD-10-CM | POA: Diagnosis not present

## 2018-09-22 DIAGNOSIS — G8250 Quadriplegia, unspecified: Secondary | ICD-10-CM | POA: Diagnosis not present

## 2018-09-22 DIAGNOSIS — L89152 Pressure ulcer of sacral region, stage 2: Secondary | ICD-10-CM | POA: Diagnosis not present

## 2018-09-22 DIAGNOSIS — Z7982 Long term (current) use of aspirin: Secondary | ICD-10-CM | POA: Diagnosis not present

## 2018-09-22 DIAGNOSIS — Z466 Encounter for fitting and adjustment of urinary device: Secondary | ICD-10-CM | POA: Diagnosis not present

## 2018-09-22 DIAGNOSIS — G834 Cauda equina syndrome: Secondary | ICD-10-CM | POA: Diagnosis not present

## 2018-09-22 DIAGNOSIS — Z79891 Long term (current) use of opiate analgesic: Secondary | ICD-10-CM | POA: Diagnosis not present

## 2018-09-22 DIAGNOSIS — Z791 Long term (current) use of non-steroidal anti-inflammatories (NSAID): Secondary | ICD-10-CM | POA: Diagnosis not present

## 2018-09-25 DIAGNOSIS — G834 Cauda equina syndrome: Secondary | ICD-10-CM | POA: Diagnosis not present

## 2018-09-25 DIAGNOSIS — Z791 Long term (current) use of non-steroidal anti-inflammatories (NSAID): Secondary | ICD-10-CM | POA: Diagnosis not present

## 2018-09-25 DIAGNOSIS — Z79891 Long term (current) use of opiate analgesic: Secondary | ICD-10-CM | POA: Diagnosis not present

## 2018-09-25 DIAGNOSIS — Z466 Encounter for fitting and adjustment of urinary device: Secondary | ICD-10-CM | POA: Diagnosis not present

## 2018-09-25 DIAGNOSIS — G8250 Quadriplegia, unspecified: Secondary | ICD-10-CM | POA: Diagnosis not present

## 2018-09-25 DIAGNOSIS — L89152 Pressure ulcer of sacral region, stage 2: Secondary | ICD-10-CM | POA: Diagnosis not present

## 2018-09-25 DIAGNOSIS — Z7982 Long term (current) use of aspirin: Secondary | ICD-10-CM | POA: Diagnosis not present

## 2018-09-26 DIAGNOSIS — G8250 Quadriplegia, unspecified: Secondary | ICD-10-CM | POA: Diagnosis not present

## 2018-09-26 DIAGNOSIS — L89152 Pressure ulcer of sacral region, stage 2: Secondary | ICD-10-CM | POA: Diagnosis not present

## 2018-09-26 DIAGNOSIS — Z7982 Long term (current) use of aspirin: Secondary | ICD-10-CM | POA: Diagnosis not present

## 2018-09-26 DIAGNOSIS — Z79891 Long term (current) use of opiate analgesic: Secondary | ICD-10-CM | POA: Diagnosis not present

## 2018-09-26 DIAGNOSIS — Z791 Long term (current) use of non-steroidal anti-inflammatories (NSAID): Secondary | ICD-10-CM | POA: Diagnosis not present

## 2018-09-26 DIAGNOSIS — G834 Cauda equina syndrome: Secondary | ICD-10-CM | POA: Diagnosis not present

## 2018-09-26 DIAGNOSIS — Z466 Encounter for fitting and adjustment of urinary device: Secondary | ICD-10-CM | POA: Diagnosis not present

## 2018-09-27 DIAGNOSIS — Z791 Long term (current) use of non-steroidal anti-inflammatories (NSAID): Secondary | ICD-10-CM | POA: Diagnosis not present

## 2018-09-27 DIAGNOSIS — Z7982 Long term (current) use of aspirin: Secondary | ICD-10-CM | POA: Diagnosis not present

## 2018-09-27 DIAGNOSIS — Z466 Encounter for fitting and adjustment of urinary device: Secondary | ICD-10-CM | POA: Diagnosis not present

## 2018-09-27 DIAGNOSIS — Z993 Dependence on wheelchair: Secondary | ICD-10-CM | POA: Diagnosis not present

## 2018-09-27 DIAGNOSIS — Z79891 Long term (current) use of opiate analgesic: Secondary | ICD-10-CM | POA: Diagnosis not present

## 2018-09-27 DIAGNOSIS — G834 Cauda equina syndrome: Secondary | ICD-10-CM | POA: Diagnosis not present

## 2018-09-27 DIAGNOSIS — L89152 Pressure ulcer of sacral region, stage 2: Secondary | ICD-10-CM | POA: Diagnosis not present

## 2018-09-27 DIAGNOSIS — G8250 Quadriplegia, unspecified: Secondary | ICD-10-CM | POA: Diagnosis not present

## 2018-09-29 DIAGNOSIS — Z7982 Long term (current) use of aspirin: Secondary | ICD-10-CM | POA: Diagnosis not present

## 2018-09-29 DIAGNOSIS — Z466 Encounter for fitting and adjustment of urinary device: Secondary | ICD-10-CM | POA: Diagnosis not present

## 2018-09-29 DIAGNOSIS — G8250 Quadriplegia, unspecified: Secondary | ICD-10-CM | POA: Diagnosis not present

## 2018-09-29 DIAGNOSIS — L89152 Pressure ulcer of sacral region, stage 2: Secondary | ICD-10-CM | POA: Diagnosis not present

## 2018-09-29 DIAGNOSIS — Z791 Long term (current) use of non-steroidal anti-inflammatories (NSAID): Secondary | ICD-10-CM | POA: Diagnosis not present

## 2018-09-29 DIAGNOSIS — G834 Cauda equina syndrome: Secondary | ICD-10-CM | POA: Diagnosis not present

## 2018-09-29 DIAGNOSIS — Z79891 Long term (current) use of opiate analgesic: Secondary | ICD-10-CM | POA: Diagnosis not present

## 2018-10-02 DIAGNOSIS — Z7982 Long term (current) use of aspirin: Secondary | ICD-10-CM | POA: Diagnosis not present

## 2018-10-02 DIAGNOSIS — G8250 Quadriplegia, unspecified: Secondary | ICD-10-CM | POA: Diagnosis not present

## 2018-10-02 DIAGNOSIS — Z466 Encounter for fitting and adjustment of urinary device: Secondary | ICD-10-CM | POA: Diagnosis not present

## 2018-10-02 DIAGNOSIS — Z791 Long term (current) use of non-steroidal anti-inflammatories (NSAID): Secondary | ICD-10-CM | POA: Diagnosis not present

## 2018-10-02 DIAGNOSIS — Z79891 Long term (current) use of opiate analgesic: Secondary | ICD-10-CM | POA: Diagnosis not present

## 2018-10-02 DIAGNOSIS — L89152 Pressure ulcer of sacral region, stage 2: Secondary | ICD-10-CM | POA: Diagnosis not present

## 2018-10-02 DIAGNOSIS — G834 Cauda equina syndrome: Secondary | ICD-10-CM | POA: Diagnosis not present

## 2018-10-04 DIAGNOSIS — Z791 Long term (current) use of non-steroidal anti-inflammatories (NSAID): Secondary | ICD-10-CM | POA: Diagnosis not present

## 2018-10-04 DIAGNOSIS — G834 Cauda equina syndrome: Secondary | ICD-10-CM | POA: Diagnosis not present

## 2018-10-04 DIAGNOSIS — Z79891 Long term (current) use of opiate analgesic: Secondary | ICD-10-CM | POA: Diagnosis not present

## 2018-10-04 DIAGNOSIS — G8250 Quadriplegia, unspecified: Secondary | ICD-10-CM | POA: Diagnosis not present

## 2018-10-04 DIAGNOSIS — Z466 Encounter for fitting and adjustment of urinary device: Secondary | ICD-10-CM | POA: Diagnosis not present

## 2018-10-04 DIAGNOSIS — Z7982 Long term (current) use of aspirin: Secondary | ICD-10-CM | POA: Diagnosis not present

## 2018-10-04 DIAGNOSIS — L89152 Pressure ulcer of sacral region, stage 2: Secondary | ICD-10-CM | POA: Diagnosis not present

## 2018-10-06 DIAGNOSIS — Z79891 Long term (current) use of opiate analgesic: Secondary | ICD-10-CM | POA: Diagnosis not present

## 2018-10-06 DIAGNOSIS — L89152 Pressure ulcer of sacral region, stage 2: Secondary | ICD-10-CM | POA: Diagnosis not present

## 2018-10-06 DIAGNOSIS — Z791 Long term (current) use of non-steroidal anti-inflammatories (NSAID): Secondary | ICD-10-CM | POA: Diagnosis not present

## 2018-10-06 DIAGNOSIS — Z7982 Long term (current) use of aspirin: Secondary | ICD-10-CM | POA: Diagnosis not present

## 2018-10-06 DIAGNOSIS — Z466 Encounter for fitting and adjustment of urinary device: Secondary | ICD-10-CM | POA: Diagnosis not present

## 2018-10-06 DIAGNOSIS — G8250 Quadriplegia, unspecified: Secondary | ICD-10-CM | POA: Diagnosis not present

## 2018-10-06 DIAGNOSIS — G834 Cauda equina syndrome: Secondary | ICD-10-CM | POA: Diagnosis not present

## 2018-10-09 DIAGNOSIS — Z791 Long term (current) use of non-steroidal anti-inflammatories (NSAID): Secondary | ICD-10-CM | POA: Diagnosis not present

## 2018-10-09 DIAGNOSIS — Z466 Encounter for fitting and adjustment of urinary device: Secondary | ICD-10-CM | POA: Diagnosis not present

## 2018-10-09 DIAGNOSIS — Z79891 Long term (current) use of opiate analgesic: Secondary | ICD-10-CM | POA: Diagnosis not present

## 2018-10-09 DIAGNOSIS — Z7982 Long term (current) use of aspirin: Secondary | ICD-10-CM | POA: Diagnosis not present

## 2018-10-09 DIAGNOSIS — G8250 Quadriplegia, unspecified: Secondary | ICD-10-CM | POA: Diagnosis not present

## 2018-10-09 DIAGNOSIS — L89152 Pressure ulcer of sacral region, stage 2: Secondary | ICD-10-CM | POA: Diagnosis not present

## 2018-10-09 DIAGNOSIS — G834 Cauda equina syndrome: Secondary | ICD-10-CM | POA: Diagnosis not present

## 2018-10-11 DIAGNOSIS — G834 Cauda equina syndrome: Secondary | ICD-10-CM | POA: Diagnosis not present

## 2018-10-11 DIAGNOSIS — L89152 Pressure ulcer of sacral region, stage 2: Secondary | ICD-10-CM | POA: Diagnosis not present

## 2018-10-11 DIAGNOSIS — Z7982 Long term (current) use of aspirin: Secondary | ICD-10-CM | POA: Diagnosis not present

## 2018-10-11 DIAGNOSIS — Z791 Long term (current) use of non-steroidal anti-inflammatories (NSAID): Secondary | ICD-10-CM | POA: Diagnosis not present

## 2018-10-11 DIAGNOSIS — Z466 Encounter for fitting and adjustment of urinary device: Secondary | ICD-10-CM | POA: Diagnosis not present

## 2018-10-11 DIAGNOSIS — Z79891 Long term (current) use of opiate analgesic: Secondary | ICD-10-CM | POA: Diagnosis not present

## 2018-10-11 DIAGNOSIS — G8250 Quadriplegia, unspecified: Secondary | ICD-10-CM | POA: Diagnosis not present

## 2018-10-13 DIAGNOSIS — G8250 Quadriplegia, unspecified: Secondary | ICD-10-CM | POA: Diagnosis not present

## 2018-10-13 DIAGNOSIS — L89152 Pressure ulcer of sacral region, stage 2: Secondary | ICD-10-CM | POA: Diagnosis not present

## 2018-10-13 DIAGNOSIS — Z79891 Long term (current) use of opiate analgesic: Secondary | ICD-10-CM | POA: Diagnosis not present

## 2018-10-13 DIAGNOSIS — Z791 Long term (current) use of non-steroidal anti-inflammatories (NSAID): Secondary | ICD-10-CM | POA: Diagnosis not present

## 2018-10-13 DIAGNOSIS — G834 Cauda equina syndrome: Secondary | ICD-10-CM | POA: Diagnosis not present

## 2018-10-13 DIAGNOSIS — Z7982 Long term (current) use of aspirin: Secondary | ICD-10-CM | POA: Diagnosis not present

## 2018-10-13 DIAGNOSIS — Z466 Encounter for fitting and adjustment of urinary device: Secondary | ICD-10-CM | POA: Diagnosis not present

## 2018-10-16 DIAGNOSIS — Z466 Encounter for fitting and adjustment of urinary device: Secondary | ICD-10-CM | POA: Diagnosis not present

## 2018-10-16 DIAGNOSIS — Z791 Long term (current) use of non-steroidal anti-inflammatories (NSAID): Secondary | ICD-10-CM | POA: Diagnosis not present

## 2018-10-16 DIAGNOSIS — Z79891 Long term (current) use of opiate analgesic: Secondary | ICD-10-CM | POA: Diagnosis not present

## 2018-10-16 DIAGNOSIS — G834 Cauda equina syndrome: Secondary | ICD-10-CM | POA: Diagnosis not present

## 2018-10-16 DIAGNOSIS — Z7982 Long term (current) use of aspirin: Secondary | ICD-10-CM | POA: Diagnosis not present

## 2018-10-16 DIAGNOSIS — L89152 Pressure ulcer of sacral region, stage 2: Secondary | ICD-10-CM | POA: Diagnosis not present

## 2018-10-16 DIAGNOSIS — G8250 Quadriplegia, unspecified: Secondary | ICD-10-CM | POA: Diagnosis not present

## 2018-10-17 DIAGNOSIS — L89152 Pressure ulcer of sacral region, stage 2: Secondary | ICD-10-CM | POA: Diagnosis not present

## 2018-10-17 DIAGNOSIS — Z466 Encounter for fitting and adjustment of urinary device: Secondary | ICD-10-CM | POA: Diagnosis not present

## 2018-10-17 DIAGNOSIS — Z7982 Long term (current) use of aspirin: Secondary | ICD-10-CM | POA: Diagnosis not present

## 2018-10-17 DIAGNOSIS — G834 Cauda equina syndrome: Secondary | ICD-10-CM | POA: Diagnosis not present

## 2018-10-17 DIAGNOSIS — G8250 Quadriplegia, unspecified: Secondary | ICD-10-CM | POA: Diagnosis not present

## 2018-10-17 DIAGNOSIS — Z791 Long term (current) use of non-steroidal anti-inflammatories (NSAID): Secondary | ICD-10-CM | POA: Diagnosis not present

## 2018-10-17 DIAGNOSIS — Z79891 Long term (current) use of opiate analgesic: Secondary | ICD-10-CM | POA: Diagnosis not present

## 2018-10-18 DIAGNOSIS — Z7982 Long term (current) use of aspirin: Secondary | ICD-10-CM | POA: Diagnosis not present

## 2018-10-18 DIAGNOSIS — Z79891 Long term (current) use of opiate analgesic: Secondary | ICD-10-CM | POA: Diagnosis not present

## 2018-10-18 DIAGNOSIS — Z791 Long term (current) use of non-steroidal anti-inflammatories (NSAID): Secondary | ICD-10-CM | POA: Diagnosis not present

## 2018-10-18 DIAGNOSIS — Z466 Encounter for fitting and adjustment of urinary device: Secondary | ICD-10-CM | POA: Diagnosis not present

## 2018-10-18 DIAGNOSIS — G834 Cauda equina syndrome: Secondary | ICD-10-CM | POA: Diagnosis not present

## 2018-10-18 DIAGNOSIS — G8250 Quadriplegia, unspecified: Secondary | ICD-10-CM | POA: Diagnosis not present

## 2018-10-18 DIAGNOSIS — L89152 Pressure ulcer of sacral region, stage 2: Secondary | ICD-10-CM | POA: Diagnosis not present

## 2018-10-20 DIAGNOSIS — Z466 Encounter for fitting and adjustment of urinary device: Secondary | ICD-10-CM | POA: Diagnosis not present

## 2018-10-20 DIAGNOSIS — Z79891 Long term (current) use of opiate analgesic: Secondary | ICD-10-CM | POA: Diagnosis not present

## 2018-10-20 DIAGNOSIS — Z993 Dependence on wheelchair: Secondary | ICD-10-CM | POA: Diagnosis not present

## 2018-10-20 DIAGNOSIS — G8250 Quadriplegia, unspecified: Secondary | ICD-10-CM | POA: Diagnosis not present

## 2018-10-20 DIAGNOSIS — L89152 Pressure ulcer of sacral region, stage 2: Secondary | ICD-10-CM | POA: Diagnosis not present

## 2018-10-20 DIAGNOSIS — Z791 Long term (current) use of non-steroidal anti-inflammatories (NSAID): Secondary | ICD-10-CM | POA: Diagnosis not present

## 2018-10-20 DIAGNOSIS — Z7982 Long term (current) use of aspirin: Secondary | ICD-10-CM | POA: Diagnosis not present

## 2018-10-20 DIAGNOSIS — L89159 Pressure ulcer of sacral region, unspecified stage: Secondary | ICD-10-CM | POA: Diagnosis not present

## 2018-10-20 DIAGNOSIS — G834 Cauda equina syndrome: Secondary | ICD-10-CM | POA: Diagnosis not present

## 2018-10-30 DIAGNOSIS — G8250 Quadriplegia, unspecified: Secondary | ICD-10-CM | POA: Diagnosis not present

## 2018-10-30 DIAGNOSIS — G834 Cauda equina syndrome: Secondary | ICD-10-CM | POA: Diagnosis not present

## 2018-10-30 DIAGNOSIS — Z79891 Long term (current) use of opiate analgesic: Secondary | ICD-10-CM | POA: Diagnosis not present

## 2018-10-30 DIAGNOSIS — Z7982 Long term (current) use of aspirin: Secondary | ICD-10-CM | POA: Diagnosis not present

## 2018-10-30 DIAGNOSIS — L89152 Pressure ulcer of sacral region, stage 2: Secondary | ICD-10-CM | POA: Diagnosis not present

## 2018-10-30 DIAGNOSIS — Z791 Long term (current) use of non-steroidal anti-inflammatories (NSAID): Secondary | ICD-10-CM | POA: Diagnosis not present

## 2018-10-30 DIAGNOSIS — Z466 Encounter for fitting and adjustment of urinary device: Secondary | ICD-10-CM | POA: Diagnosis not present

## 2018-11-01 DIAGNOSIS — Z7982 Long term (current) use of aspirin: Secondary | ICD-10-CM | POA: Diagnosis not present

## 2018-11-01 DIAGNOSIS — G834 Cauda equina syndrome: Secondary | ICD-10-CM | POA: Diagnosis not present

## 2018-11-01 DIAGNOSIS — Z79891 Long term (current) use of opiate analgesic: Secondary | ICD-10-CM | POA: Diagnosis not present

## 2018-11-01 DIAGNOSIS — L89152 Pressure ulcer of sacral region, stage 2: Secondary | ICD-10-CM | POA: Diagnosis not present

## 2018-11-01 DIAGNOSIS — Z466 Encounter for fitting and adjustment of urinary device: Secondary | ICD-10-CM | POA: Diagnosis not present

## 2018-11-01 DIAGNOSIS — G8250 Quadriplegia, unspecified: Secondary | ICD-10-CM | POA: Diagnosis not present

## 2018-11-01 DIAGNOSIS — Z791 Long term (current) use of non-steroidal anti-inflammatories (NSAID): Secondary | ICD-10-CM | POA: Diagnosis not present

## 2018-11-03 DIAGNOSIS — Z79891 Long term (current) use of opiate analgesic: Secondary | ICD-10-CM | POA: Diagnosis not present

## 2018-11-03 DIAGNOSIS — G8250 Quadriplegia, unspecified: Secondary | ICD-10-CM | POA: Diagnosis not present

## 2018-11-03 DIAGNOSIS — Z791 Long term (current) use of non-steroidal anti-inflammatories (NSAID): Secondary | ICD-10-CM | POA: Diagnosis not present

## 2018-11-03 DIAGNOSIS — G834 Cauda equina syndrome: Secondary | ICD-10-CM | POA: Diagnosis not present

## 2018-11-03 DIAGNOSIS — Z466 Encounter for fitting and adjustment of urinary device: Secondary | ICD-10-CM | POA: Diagnosis not present

## 2018-11-03 DIAGNOSIS — L89152 Pressure ulcer of sacral region, stage 2: Secondary | ICD-10-CM | POA: Diagnosis not present

## 2018-11-03 DIAGNOSIS — Z7982 Long term (current) use of aspirin: Secondary | ICD-10-CM | POA: Diagnosis not present

## 2018-11-06 DIAGNOSIS — Z791 Long term (current) use of non-steroidal anti-inflammatories (NSAID): Secondary | ICD-10-CM | POA: Diagnosis not present

## 2018-11-06 DIAGNOSIS — Z79891 Long term (current) use of opiate analgesic: Secondary | ICD-10-CM | POA: Diagnosis not present

## 2018-11-06 DIAGNOSIS — L89152 Pressure ulcer of sacral region, stage 2: Secondary | ICD-10-CM | POA: Diagnosis not present

## 2018-11-06 DIAGNOSIS — Z7982 Long term (current) use of aspirin: Secondary | ICD-10-CM | POA: Diagnosis not present

## 2018-11-06 DIAGNOSIS — G8250 Quadriplegia, unspecified: Secondary | ICD-10-CM | POA: Diagnosis not present

## 2018-11-06 DIAGNOSIS — Z466 Encounter for fitting and adjustment of urinary device: Secondary | ICD-10-CM | POA: Diagnosis not present

## 2018-11-06 DIAGNOSIS — G834 Cauda equina syndrome: Secondary | ICD-10-CM | POA: Diagnosis not present

## 2018-11-07 DIAGNOSIS — G834 Cauda equina syndrome: Secondary | ICD-10-CM | POA: Diagnosis not present

## 2018-11-07 DIAGNOSIS — Z79891 Long term (current) use of opiate analgesic: Secondary | ICD-10-CM | POA: Diagnosis not present

## 2018-11-07 DIAGNOSIS — G8250 Quadriplegia, unspecified: Secondary | ICD-10-CM | POA: Diagnosis not present

## 2018-11-07 DIAGNOSIS — L89152 Pressure ulcer of sacral region, stage 2: Secondary | ICD-10-CM | POA: Diagnosis not present

## 2018-11-07 DIAGNOSIS — Z7982 Long term (current) use of aspirin: Secondary | ICD-10-CM | POA: Diagnosis not present

## 2018-11-07 DIAGNOSIS — Z791 Long term (current) use of non-steroidal anti-inflammatories (NSAID): Secondary | ICD-10-CM | POA: Diagnosis not present

## 2018-11-07 DIAGNOSIS — Z466 Encounter for fitting and adjustment of urinary device: Secondary | ICD-10-CM | POA: Diagnosis not present

## 2018-11-08 DIAGNOSIS — Z79891 Long term (current) use of opiate analgesic: Secondary | ICD-10-CM | POA: Diagnosis not present

## 2018-11-08 DIAGNOSIS — Z7982 Long term (current) use of aspirin: Secondary | ICD-10-CM | POA: Diagnosis not present

## 2018-11-08 DIAGNOSIS — Z466 Encounter for fitting and adjustment of urinary device: Secondary | ICD-10-CM | POA: Diagnosis not present

## 2018-11-08 DIAGNOSIS — G834 Cauda equina syndrome: Secondary | ICD-10-CM | POA: Diagnosis not present

## 2018-11-08 DIAGNOSIS — L89152 Pressure ulcer of sacral region, stage 2: Secondary | ICD-10-CM | POA: Diagnosis not present

## 2018-11-08 DIAGNOSIS — Z791 Long term (current) use of non-steroidal anti-inflammatories (NSAID): Secondary | ICD-10-CM | POA: Diagnosis not present

## 2018-11-08 DIAGNOSIS — G8250 Quadriplegia, unspecified: Secondary | ICD-10-CM | POA: Diagnosis not present

## 2018-11-10 DIAGNOSIS — G834 Cauda equina syndrome: Secondary | ICD-10-CM | POA: Diagnosis not present

## 2018-11-10 DIAGNOSIS — Z466 Encounter for fitting and adjustment of urinary device: Secondary | ICD-10-CM | POA: Diagnosis not present

## 2018-11-10 DIAGNOSIS — Z791 Long term (current) use of non-steroidal anti-inflammatories (NSAID): Secondary | ICD-10-CM | POA: Diagnosis not present

## 2018-11-10 DIAGNOSIS — Z79891 Long term (current) use of opiate analgesic: Secondary | ICD-10-CM | POA: Diagnosis not present

## 2018-11-10 DIAGNOSIS — G8250 Quadriplegia, unspecified: Secondary | ICD-10-CM | POA: Diagnosis not present

## 2018-11-10 DIAGNOSIS — Z7982 Long term (current) use of aspirin: Secondary | ICD-10-CM | POA: Diagnosis not present

## 2018-11-10 DIAGNOSIS — L89152 Pressure ulcer of sacral region, stage 2: Secondary | ICD-10-CM | POA: Diagnosis not present

## 2018-11-14 DIAGNOSIS — Z7982 Long term (current) use of aspirin: Secondary | ICD-10-CM | POA: Diagnosis not present

## 2018-11-14 DIAGNOSIS — Z79891 Long term (current) use of opiate analgesic: Secondary | ICD-10-CM | POA: Diagnosis not present

## 2018-11-14 DIAGNOSIS — Z791 Long term (current) use of non-steroidal anti-inflammatories (NSAID): Secondary | ICD-10-CM | POA: Diagnosis not present

## 2018-11-14 DIAGNOSIS — L89152 Pressure ulcer of sacral region, stage 2: Secondary | ICD-10-CM | POA: Diagnosis not present

## 2018-11-14 DIAGNOSIS — Z466 Encounter for fitting and adjustment of urinary device: Secondary | ICD-10-CM | POA: Diagnosis not present

## 2018-11-14 DIAGNOSIS — G8250 Quadriplegia, unspecified: Secondary | ICD-10-CM | POA: Diagnosis not present

## 2018-11-14 DIAGNOSIS — G834 Cauda equina syndrome: Secondary | ICD-10-CM | POA: Diagnosis not present

## 2018-11-15 DIAGNOSIS — G8250 Quadriplegia, unspecified: Secondary | ICD-10-CM | POA: Diagnosis not present

## 2018-11-15 DIAGNOSIS — Z7982 Long term (current) use of aspirin: Secondary | ICD-10-CM | POA: Diagnosis not present

## 2018-11-15 DIAGNOSIS — L89152 Pressure ulcer of sacral region, stage 2: Secondary | ICD-10-CM | POA: Diagnosis not present

## 2018-11-15 DIAGNOSIS — G834 Cauda equina syndrome: Secondary | ICD-10-CM | POA: Diagnosis not present

## 2018-11-15 DIAGNOSIS — Z466 Encounter for fitting and adjustment of urinary device: Secondary | ICD-10-CM | POA: Diagnosis not present

## 2018-11-15 DIAGNOSIS — Z79891 Long term (current) use of opiate analgesic: Secondary | ICD-10-CM | POA: Diagnosis not present

## 2018-11-15 DIAGNOSIS — Z791 Long term (current) use of non-steroidal anti-inflammatories (NSAID): Secondary | ICD-10-CM | POA: Diagnosis not present

## 2018-11-16 DIAGNOSIS — G8250 Quadriplegia, unspecified: Secondary | ICD-10-CM | POA: Diagnosis not present

## 2018-11-16 DIAGNOSIS — G834 Cauda equina syndrome: Secondary | ICD-10-CM | POA: Diagnosis not present

## 2018-11-16 DIAGNOSIS — Z466 Encounter for fitting and adjustment of urinary device: Secondary | ICD-10-CM | POA: Diagnosis not present

## 2018-11-16 DIAGNOSIS — Z79891 Long term (current) use of opiate analgesic: Secondary | ICD-10-CM | POA: Diagnosis not present

## 2018-11-16 DIAGNOSIS — L89152 Pressure ulcer of sacral region, stage 2: Secondary | ICD-10-CM | POA: Diagnosis not present

## 2018-11-16 DIAGNOSIS — Z791 Long term (current) use of non-steroidal anti-inflammatories (NSAID): Secondary | ICD-10-CM | POA: Diagnosis not present

## 2018-11-16 DIAGNOSIS — Z7982 Long term (current) use of aspirin: Secondary | ICD-10-CM | POA: Diagnosis not present

## 2018-11-17 DIAGNOSIS — G8250 Quadriplegia, unspecified: Secondary | ICD-10-CM | POA: Diagnosis not present

## 2018-11-17 DIAGNOSIS — Z993 Dependence on wheelchair: Secondary | ICD-10-CM | POA: Diagnosis not present

## 2018-11-17 DIAGNOSIS — Z791 Long term (current) use of non-steroidal anti-inflammatories (NSAID): Secondary | ICD-10-CM | POA: Diagnosis not present

## 2018-11-17 DIAGNOSIS — G834 Cauda equina syndrome: Secondary | ICD-10-CM | POA: Diagnosis not present

## 2018-11-17 DIAGNOSIS — L89152 Pressure ulcer of sacral region, stage 2: Secondary | ICD-10-CM | POA: Diagnosis not present

## 2018-11-17 DIAGNOSIS — Z7982 Long term (current) use of aspirin: Secondary | ICD-10-CM | POA: Diagnosis not present

## 2018-11-17 DIAGNOSIS — Z79891 Long term (current) use of opiate analgesic: Secondary | ICD-10-CM | POA: Diagnosis not present

## 2018-11-17 DIAGNOSIS — L89159 Pressure ulcer of sacral region, unspecified stage: Secondary | ICD-10-CM | POA: Diagnosis not present

## 2018-11-17 DIAGNOSIS — Z466 Encounter for fitting and adjustment of urinary device: Secondary | ICD-10-CM | POA: Diagnosis not present

## 2018-11-20 DIAGNOSIS — Z79891 Long term (current) use of opiate analgesic: Secondary | ICD-10-CM | POA: Diagnosis not present

## 2018-11-20 DIAGNOSIS — Z791 Long term (current) use of non-steroidal anti-inflammatories (NSAID): Secondary | ICD-10-CM | POA: Diagnosis not present

## 2018-11-20 DIAGNOSIS — G8250 Quadriplegia, unspecified: Secondary | ICD-10-CM | POA: Diagnosis not present

## 2018-11-20 DIAGNOSIS — Z466 Encounter for fitting and adjustment of urinary device: Secondary | ICD-10-CM | POA: Diagnosis not present

## 2018-11-20 DIAGNOSIS — L89152 Pressure ulcer of sacral region, stage 2: Secondary | ICD-10-CM | POA: Diagnosis not present

## 2018-11-20 DIAGNOSIS — Z7982 Long term (current) use of aspirin: Secondary | ICD-10-CM | POA: Diagnosis not present

## 2018-11-20 DIAGNOSIS — G834 Cauda equina syndrome: Secondary | ICD-10-CM | POA: Diagnosis not present

## 2018-11-22 DIAGNOSIS — Z466 Encounter for fitting and adjustment of urinary device: Secondary | ICD-10-CM | POA: Diagnosis not present

## 2018-11-22 DIAGNOSIS — G834 Cauda equina syndrome: Secondary | ICD-10-CM | POA: Diagnosis not present

## 2018-11-22 DIAGNOSIS — Z79891 Long term (current) use of opiate analgesic: Secondary | ICD-10-CM | POA: Diagnosis not present

## 2018-11-22 DIAGNOSIS — L89152 Pressure ulcer of sacral region, stage 2: Secondary | ICD-10-CM | POA: Diagnosis not present

## 2018-11-22 DIAGNOSIS — Z791 Long term (current) use of non-steroidal anti-inflammatories (NSAID): Secondary | ICD-10-CM | POA: Diagnosis not present

## 2018-11-22 DIAGNOSIS — G8250 Quadriplegia, unspecified: Secondary | ICD-10-CM | POA: Diagnosis not present

## 2018-11-22 DIAGNOSIS — Z7982 Long term (current) use of aspirin: Secondary | ICD-10-CM | POA: Diagnosis not present

## 2018-12-13 DIAGNOSIS — R339 Retention of urine, unspecified: Secondary | ICD-10-CM | POA: Diagnosis not present

## 2018-12-13 DIAGNOSIS — G8250 Quadriplegia, unspecified: Secondary | ICD-10-CM | POA: Diagnosis not present

## 2018-12-13 DIAGNOSIS — Z91040 Latex allergy status: Secondary | ICD-10-CM | POA: Diagnosis not present

## 2018-12-13 DIAGNOSIS — Z882 Allergy status to sulfonamides status: Secondary | ICD-10-CM | POA: Diagnosis not present

## 2018-12-13 DIAGNOSIS — Z87440 Personal history of urinary (tract) infections: Secondary | ICD-10-CM | POA: Diagnosis not present

## 2018-12-13 DIAGNOSIS — Z23 Encounter for immunization: Secondary | ICD-10-CM | POA: Diagnosis not present

## 2018-12-26 DIAGNOSIS — N390 Urinary tract infection, site not specified: Secondary | ICD-10-CM | POA: Diagnosis not present

## 2018-12-27 DIAGNOSIS — G8250 Quadriplegia, unspecified: Secondary | ICD-10-CM | POA: Diagnosis not present

## 2018-12-27 DIAGNOSIS — Z0000 Encounter for general adult medical examination without abnormal findings: Secondary | ICD-10-CM | POA: Diagnosis not present

## 2018-12-27 DIAGNOSIS — N3010 Interstitial cystitis (chronic) without hematuria: Secondary | ICD-10-CM | POA: Diagnosis not present

## 2018-12-27 DIAGNOSIS — K029 Dental caries, unspecified: Secondary | ICD-10-CM | POA: Diagnosis not present

## 2019-03-12 DIAGNOSIS — Z466 Encounter for fitting and adjustment of urinary device: Secondary | ICD-10-CM | POA: Diagnosis not present

## 2019-03-12 DIAGNOSIS — G8250 Quadriplegia, unspecified: Secondary | ICD-10-CM | POA: Diagnosis not present

## 2019-03-12 DIAGNOSIS — Z791 Long term (current) use of non-steroidal anti-inflammatories (NSAID): Secondary | ICD-10-CM | POA: Diagnosis not present

## 2019-03-12 DIAGNOSIS — Z993 Dependence on wheelchair: Secondary | ICD-10-CM | POA: Diagnosis not present

## 2019-03-12 DIAGNOSIS — Z792 Long term (current) use of antibiotics: Secondary | ICD-10-CM | POA: Diagnosis not present

## 2019-03-12 DIAGNOSIS — G834 Cauda equina syndrome: Secondary | ICD-10-CM | POA: Diagnosis not present

## 2019-03-14 DIAGNOSIS — Z993 Dependence on wheelchair: Secondary | ICD-10-CM | POA: Diagnosis not present

## 2019-03-14 DIAGNOSIS — Z792 Long term (current) use of antibiotics: Secondary | ICD-10-CM | POA: Diagnosis not present

## 2019-03-14 DIAGNOSIS — Z466 Encounter for fitting and adjustment of urinary device: Secondary | ICD-10-CM | POA: Diagnosis not present

## 2019-03-14 DIAGNOSIS — Z791 Long term (current) use of non-steroidal anti-inflammatories (NSAID): Secondary | ICD-10-CM | POA: Diagnosis not present

## 2019-03-14 DIAGNOSIS — G834 Cauda equina syndrome: Secondary | ICD-10-CM | POA: Diagnosis not present

## 2019-03-14 DIAGNOSIS — G8250 Quadriplegia, unspecified: Secondary | ICD-10-CM | POA: Diagnosis not present

## 2019-03-16 DIAGNOSIS — Z791 Long term (current) use of non-steroidal anti-inflammatories (NSAID): Secondary | ICD-10-CM | POA: Diagnosis not present

## 2019-03-16 DIAGNOSIS — Z792 Long term (current) use of antibiotics: Secondary | ICD-10-CM | POA: Diagnosis not present

## 2019-03-16 DIAGNOSIS — Z466 Encounter for fitting and adjustment of urinary device: Secondary | ICD-10-CM | POA: Diagnosis not present

## 2019-03-16 DIAGNOSIS — Z993 Dependence on wheelchair: Secondary | ICD-10-CM | POA: Diagnosis not present

## 2019-03-16 DIAGNOSIS — G8250 Quadriplegia, unspecified: Secondary | ICD-10-CM | POA: Diagnosis not present

## 2019-03-16 DIAGNOSIS — G834 Cauda equina syndrome: Secondary | ICD-10-CM | POA: Diagnosis not present

## 2019-03-19 DIAGNOSIS — Z791 Long term (current) use of non-steroidal anti-inflammatories (NSAID): Secondary | ICD-10-CM | POA: Diagnosis not present

## 2019-03-19 DIAGNOSIS — G834 Cauda equina syndrome: Secondary | ICD-10-CM | POA: Diagnosis not present

## 2019-03-19 DIAGNOSIS — Z993 Dependence on wheelchair: Secondary | ICD-10-CM | POA: Diagnosis not present

## 2019-03-19 DIAGNOSIS — Z792 Long term (current) use of antibiotics: Secondary | ICD-10-CM | POA: Diagnosis not present

## 2019-03-19 DIAGNOSIS — G8250 Quadriplegia, unspecified: Secondary | ICD-10-CM | POA: Diagnosis not present

## 2019-03-19 DIAGNOSIS — Z466 Encounter for fitting and adjustment of urinary device: Secondary | ICD-10-CM | POA: Diagnosis not present

## 2019-03-21 DIAGNOSIS — Z791 Long term (current) use of non-steroidal anti-inflammatories (NSAID): Secondary | ICD-10-CM | POA: Diagnosis not present

## 2019-03-21 DIAGNOSIS — G8250 Quadriplegia, unspecified: Secondary | ICD-10-CM | POA: Diagnosis not present

## 2019-03-21 DIAGNOSIS — Z792 Long term (current) use of antibiotics: Secondary | ICD-10-CM | POA: Diagnosis not present

## 2019-03-21 DIAGNOSIS — G834 Cauda equina syndrome: Secondary | ICD-10-CM | POA: Diagnosis not present

## 2019-03-21 DIAGNOSIS — Z993 Dependence on wheelchair: Secondary | ICD-10-CM | POA: Diagnosis not present

## 2019-03-21 DIAGNOSIS — Z466 Encounter for fitting and adjustment of urinary device: Secondary | ICD-10-CM | POA: Diagnosis not present

## 2019-03-22 DIAGNOSIS — G8250 Quadriplegia, unspecified: Secondary | ICD-10-CM | POA: Diagnosis not present

## 2019-03-22 DIAGNOSIS — Z792 Long term (current) use of antibiotics: Secondary | ICD-10-CM | POA: Diagnosis not present

## 2019-03-22 DIAGNOSIS — G834 Cauda equina syndrome: Secondary | ICD-10-CM | POA: Diagnosis not present

## 2019-03-22 DIAGNOSIS — Z466 Encounter for fitting and adjustment of urinary device: Secondary | ICD-10-CM | POA: Diagnosis not present

## 2019-03-22 DIAGNOSIS — Z791 Long term (current) use of non-steroidal anti-inflammatories (NSAID): Secondary | ICD-10-CM | POA: Diagnosis not present

## 2019-03-22 DIAGNOSIS — Z993 Dependence on wheelchair: Secondary | ICD-10-CM | POA: Diagnosis not present

## 2019-03-23 DIAGNOSIS — Z993 Dependence on wheelchair: Secondary | ICD-10-CM | POA: Diagnosis not present

## 2019-03-23 DIAGNOSIS — Z791 Long term (current) use of non-steroidal anti-inflammatories (NSAID): Secondary | ICD-10-CM | POA: Diagnosis not present

## 2019-03-23 DIAGNOSIS — G834 Cauda equina syndrome: Secondary | ICD-10-CM | POA: Diagnosis not present

## 2019-03-23 DIAGNOSIS — Z466 Encounter for fitting and adjustment of urinary device: Secondary | ICD-10-CM | POA: Diagnosis not present

## 2019-03-23 DIAGNOSIS — G8250 Quadriplegia, unspecified: Secondary | ICD-10-CM | POA: Diagnosis not present

## 2019-03-23 DIAGNOSIS — Z792 Long term (current) use of antibiotics: Secondary | ICD-10-CM | POA: Diagnosis not present

## 2019-03-26 DIAGNOSIS — Z791 Long term (current) use of non-steroidal anti-inflammatories (NSAID): Secondary | ICD-10-CM | POA: Diagnosis not present

## 2019-03-26 DIAGNOSIS — Z993 Dependence on wheelchair: Secondary | ICD-10-CM | POA: Diagnosis not present

## 2019-03-26 DIAGNOSIS — G8250 Quadriplegia, unspecified: Secondary | ICD-10-CM | POA: Diagnosis not present

## 2019-03-26 DIAGNOSIS — Z466 Encounter for fitting and adjustment of urinary device: Secondary | ICD-10-CM | POA: Diagnosis not present

## 2019-03-26 DIAGNOSIS — G834 Cauda equina syndrome: Secondary | ICD-10-CM | POA: Diagnosis not present

## 2019-03-26 DIAGNOSIS — Z792 Long term (current) use of antibiotics: Secondary | ICD-10-CM | POA: Diagnosis not present

## 2019-03-28 DIAGNOSIS — Z993 Dependence on wheelchair: Secondary | ICD-10-CM | POA: Diagnosis not present

## 2019-03-28 DIAGNOSIS — G834 Cauda equina syndrome: Secondary | ICD-10-CM | POA: Diagnosis not present

## 2019-03-28 DIAGNOSIS — Z87440 Personal history of urinary (tract) infections: Secondary | ICD-10-CM | POA: Diagnosis not present

## 2019-03-28 DIAGNOSIS — Z466 Encounter for fitting and adjustment of urinary device: Secondary | ICD-10-CM | POA: Diagnosis not present

## 2019-03-28 DIAGNOSIS — G8250 Quadriplegia, unspecified: Secondary | ICD-10-CM | POA: Diagnosis not present

## 2019-03-28 DIAGNOSIS — Z792 Long term (current) use of antibiotics: Secondary | ICD-10-CM | POA: Diagnosis not present

## 2019-03-28 DIAGNOSIS — Z791 Long term (current) use of non-steroidal anti-inflammatories (NSAID): Secondary | ICD-10-CM | POA: Diagnosis not present

## 2019-03-28 DIAGNOSIS — R339 Retention of urine, unspecified: Secondary | ICD-10-CM | POA: Diagnosis not present

## 2019-03-30 DIAGNOSIS — G834 Cauda equina syndrome: Secondary | ICD-10-CM | POA: Diagnosis not present

## 2019-03-30 DIAGNOSIS — Z466 Encounter for fitting and adjustment of urinary device: Secondary | ICD-10-CM | POA: Diagnosis not present

## 2019-03-30 DIAGNOSIS — Z993 Dependence on wheelchair: Secondary | ICD-10-CM | POA: Diagnosis not present

## 2019-03-30 DIAGNOSIS — G8250 Quadriplegia, unspecified: Secondary | ICD-10-CM | POA: Diagnosis not present

## 2019-03-30 DIAGNOSIS — Z792 Long term (current) use of antibiotics: Secondary | ICD-10-CM | POA: Diagnosis not present

## 2019-03-30 DIAGNOSIS — Z791 Long term (current) use of non-steroidal anti-inflammatories (NSAID): Secondary | ICD-10-CM | POA: Diagnosis not present

## 2019-04-02 DIAGNOSIS — G8250 Quadriplegia, unspecified: Secondary | ICD-10-CM | POA: Diagnosis not present

## 2019-04-02 DIAGNOSIS — Z791 Long term (current) use of non-steroidal anti-inflammatories (NSAID): Secondary | ICD-10-CM | POA: Diagnosis not present

## 2019-04-02 DIAGNOSIS — Z466 Encounter for fitting and adjustment of urinary device: Secondary | ICD-10-CM | POA: Diagnosis not present

## 2019-04-02 DIAGNOSIS — Z993 Dependence on wheelchair: Secondary | ICD-10-CM | POA: Diagnosis not present

## 2019-04-02 DIAGNOSIS — Z792 Long term (current) use of antibiotics: Secondary | ICD-10-CM | POA: Diagnosis not present

## 2019-04-02 DIAGNOSIS — G834 Cauda equina syndrome: Secondary | ICD-10-CM | POA: Diagnosis not present

## 2019-04-04 DIAGNOSIS — Z466 Encounter for fitting and adjustment of urinary device: Secondary | ICD-10-CM | POA: Diagnosis not present

## 2019-04-04 DIAGNOSIS — Z791 Long term (current) use of non-steroidal anti-inflammatories (NSAID): Secondary | ICD-10-CM | POA: Diagnosis not present

## 2019-04-04 DIAGNOSIS — G8250 Quadriplegia, unspecified: Secondary | ICD-10-CM | POA: Diagnosis not present

## 2019-04-04 DIAGNOSIS — Z792 Long term (current) use of antibiotics: Secondary | ICD-10-CM | POA: Diagnosis not present

## 2019-04-04 DIAGNOSIS — Z993 Dependence on wheelchair: Secondary | ICD-10-CM | POA: Diagnosis not present

## 2019-04-04 DIAGNOSIS — G834 Cauda equina syndrome: Secondary | ICD-10-CM | POA: Diagnosis not present

## 2019-04-06 DIAGNOSIS — Z791 Long term (current) use of non-steroidal anti-inflammatories (NSAID): Secondary | ICD-10-CM | POA: Diagnosis not present

## 2019-04-06 DIAGNOSIS — G834 Cauda equina syndrome: Secondary | ICD-10-CM | POA: Diagnosis not present

## 2019-04-06 DIAGNOSIS — Z993 Dependence on wheelchair: Secondary | ICD-10-CM | POA: Diagnosis not present

## 2019-04-06 DIAGNOSIS — Z466 Encounter for fitting and adjustment of urinary device: Secondary | ICD-10-CM | POA: Diagnosis not present

## 2019-04-06 DIAGNOSIS — G8250 Quadriplegia, unspecified: Secondary | ICD-10-CM | POA: Diagnosis not present

## 2019-04-06 DIAGNOSIS — Z792 Long term (current) use of antibiotics: Secondary | ICD-10-CM | POA: Diagnosis not present

## 2019-04-09 DIAGNOSIS — G8250 Quadriplegia, unspecified: Secondary | ICD-10-CM | POA: Diagnosis not present

## 2019-04-09 DIAGNOSIS — G834 Cauda equina syndrome: Secondary | ICD-10-CM | POA: Diagnosis not present

## 2019-04-09 DIAGNOSIS — Z792 Long term (current) use of antibiotics: Secondary | ICD-10-CM | POA: Diagnosis not present

## 2019-04-09 DIAGNOSIS — Z993 Dependence on wheelchair: Secondary | ICD-10-CM | POA: Diagnosis not present

## 2019-04-09 DIAGNOSIS — Z466 Encounter for fitting and adjustment of urinary device: Secondary | ICD-10-CM | POA: Diagnosis not present

## 2019-04-09 DIAGNOSIS — Z791 Long term (current) use of non-steroidal anti-inflammatories (NSAID): Secondary | ICD-10-CM | POA: Diagnosis not present

## 2019-04-11 DIAGNOSIS — Z466 Encounter for fitting and adjustment of urinary device: Secondary | ICD-10-CM | POA: Diagnosis not present

## 2019-04-11 DIAGNOSIS — G834 Cauda equina syndrome: Secondary | ICD-10-CM | POA: Diagnosis not present

## 2019-04-11 DIAGNOSIS — Z791 Long term (current) use of non-steroidal anti-inflammatories (NSAID): Secondary | ICD-10-CM | POA: Diagnosis not present

## 2019-04-11 DIAGNOSIS — G8250 Quadriplegia, unspecified: Secondary | ICD-10-CM | POA: Diagnosis not present

## 2019-04-11 DIAGNOSIS — Z792 Long term (current) use of antibiotics: Secondary | ICD-10-CM | POA: Diagnosis not present

## 2019-04-11 DIAGNOSIS — Z993 Dependence on wheelchair: Secondary | ICD-10-CM | POA: Diagnosis not present

## 2019-04-12 DIAGNOSIS — Z466 Encounter for fitting and adjustment of urinary device: Secondary | ICD-10-CM | POA: Diagnosis not present

## 2019-04-12 DIAGNOSIS — Z791 Long term (current) use of non-steroidal anti-inflammatories (NSAID): Secondary | ICD-10-CM | POA: Diagnosis not present

## 2019-04-12 DIAGNOSIS — Z993 Dependence on wheelchair: Secondary | ICD-10-CM | POA: Diagnosis not present

## 2019-04-12 DIAGNOSIS — G8250 Quadriplegia, unspecified: Secondary | ICD-10-CM | POA: Diagnosis not present

## 2019-04-12 DIAGNOSIS — G834 Cauda equina syndrome: Secondary | ICD-10-CM | POA: Diagnosis not present

## 2019-04-12 DIAGNOSIS — Z792 Long term (current) use of antibiotics: Secondary | ICD-10-CM | POA: Diagnosis not present

## 2019-04-13 DIAGNOSIS — Z466 Encounter for fitting and adjustment of urinary device: Secondary | ICD-10-CM | POA: Diagnosis not present

## 2019-04-13 DIAGNOSIS — G8250 Quadriplegia, unspecified: Secondary | ICD-10-CM | POA: Diagnosis not present

## 2019-04-13 DIAGNOSIS — G834 Cauda equina syndrome: Secondary | ICD-10-CM | POA: Diagnosis not present

## 2019-04-13 DIAGNOSIS — Z792 Long term (current) use of antibiotics: Secondary | ICD-10-CM | POA: Diagnosis not present

## 2019-04-13 DIAGNOSIS — Z993 Dependence on wheelchair: Secondary | ICD-10-CM | POA: Diagnosis not present

## 2019-04-13 DIAGNOSIS — Z791 Long term (current) use of non-steroidal anti-inflammatories (NSAID): Secondary | ICD-10-CM | POA: Diagnosis not present

## 2019-04-16 DIAGNOSIS — Z792 Long term (current) use of antibiotics: Secondary | ICD-10-CM | POA: Diagnosis not present

## 2019-04-16 DIAGNOSIS — Z466 Encounter for fitting and adjustment of urinary device: Secondary | ICD-10-CM | POA: Diagnosis not present

## 2019-04-16 DIAGNOSIS — G834 Cauda equina syndrome: Secondary | ICD-10-CM | POA: Diagnosis not present

## 2019-04-16 DIAGNOSIS — Z791 Long term (current) use of non-steroidal anti-inflammatories (NSAID): Secondary | ICD-10-CM | POA: Diagnosis not present

## 2019-04-16 DIAGNOSIS — G8250 Quadriplegia, unspecified: Secondary | ICD-10-CM | POA: Diagnosis not present

## 2019-04-16 DIAGNOSIS — Z993 Dependence on wheelchair: Secondary | ICD-10-CM | POA: Diagnosis not present

## 2019-04-18 DIAGNOSIS — Z993 Dependence on wheelchair: Secondary | ICD-10-CM | POA: Diagnosis not present

## 2019-04-18 DIAGNOSIS — Z466 Encounter for fitting and adjustment of urinary device: Secondary | ICD-10-CM | POA: Diagnosis not present

## 2019-04-18 DIAGNOSIS — Z792 Long term (current) use of antibiotics: Secondary | ICD-10-CM | POA: Diagnosis not present

## 2019-04-18 DIAGNOSIS — G834 Cauda equina syndrome: Secondary | ICD-10-CM | POA: Diagnosis not present

## 2019-04-18 DIAGNOSIS — Z791 Long term (current) use of non-steroidal anti-inflammatories (NSAID): Secondary | ICD-10-CM | POA: Diagnosis not present

## 2019-04-18 DIAGNOSIS — G8250 Quadriplegia, unspecified: Secondary | ICD-10-CM | POA: Diagnosis not present

## 2019-04-20 DIAGNOSIS — Z466 Encounter for fitting and adjustment of urinary device: Secondary | ICD-10-CM | POA: Diagnosis not present

## 2019-04-20 DIAGNOSIS — Z792 Long term (current) use of antibiotics: Secondary | ICD-10-CM | POA: Diagnosis not present

## 2019-04-20 DIAGNOSIS — G834 Cauda equina syndrome: Secondary | ICD-10-CM | POA: Diagnosis not present

## 2019-04-20 DIAGNOSIS — Z993 Dependence on wheelchair: Secondary | ICD-10-CM | POA: Diagnosis not present

## 2019-04-20 DIAGNOSIS — Z791 Long term (current) use of non-steroidal anti-inflammatories (NSAID): Secondary | ICD-10-CM | POA: Diagnosis not present

## 2019-04-20 DIAGNOSIS — G8250 Quadriplegia, unspecified: Secondary | ICD-10-CM | POA: Diagnosis not present

## 2019-04-23 DIAGNOSIS — Z791 Long term (current) use of non-steroidal anti-inflammatories (NSAID): Secondary | ICD-10-CM | POA: Diagnosis not present

## 2019-04-23 DIAGNOSIS — Z792 Long term (current) use of antibiotics: Secondary | ICD-10-CM | POA: Diagnosis not present

## 2019-04-23 DIAGNOSIS — Z466 Encounter for fitting and adjustment of urinary device: Secondary | ICD-10-CM | POA: Diagnosis not present

## 2019-04-23 DIAGNOSIS — G8250 Quadriplegia, unspecified: Secondary | ICD-10-CM | POA: Diagnosis not present

## 2019-04-23 DIAGNOSIS — Z993 Dependence on wheelchair: Secondary | ICD-10-CM | POA: Diagnosis not present

## 2019-04-23 DIAGNOSIS — G834 Cauda equina syndrome: Secondary | ICD-10-CM | POA: Diagnosis not present

## 2019-04-25 DIAGNOSIS — G834 Cauda equina syndrome: Secondary | ICD-10-CM | POA: Diagnosis not present

## 2019-04-25 DIAGNOSIS — Z792 Long term (current) use of antibiotics: Secondary | ICD-10-CM | POA: Diagnosis not present

## 2019-04-25 DIAGNOSIS — Z466 Encounter for fitting and adjustment of urinary device: Secondary | ICD-10-CM | POA: Diagnosis not present

## 2019-04-25 DIAGNOSIS — G8250 Quadriplegia, unspecified: Secondary | ICD-10-CM | POA: Diagnosis not present

## 2019-04-25 DIAGNOSIS — Z791 Long term (current) use of non-steroidal anti-inflammatories (NSAID): Secondary | ICD-10-CM | POA: Diagnosis not present

## 2019-04-25 DIAGNOSIS — Z993 Dependence on wheelchair: Secondary | ICD-10-CM | POA: Diagnosis not present

## 2019-04-27 DIAGNOSIS — Z993 Dependence on wheelchair: Secondary | ICD-10-CM | POA: Diagnosis not present

## 2019-04-27 DIAGNOSIS — Z466 Encounter for fitting and adjustment of urinary device: Secondary | ICD-10-CM | POA: Diagnosis not present

## 2019-04-27 DIAGNOSIS — Z792 Long term (current) use of antibiotics: Secondary | ICD-10-CM | POA: Diagnosis not present

## 2019-04-27 DIAGNOSIS — G8250 Quadriplegia, unspecified: Secondary | ICD-10-CM | POA: Diagnosis not present

## 2019-04-27 DIAGNOSIS — G834 Cauda equina syndrome: Secondary | ICD-10-CM | POA: Diagnosis not present

## 2019-04-27 DIAGNOSIS — Z791 Long term (current) use of non-steroidal anti-inflammatories (NSAID): Secondary | ICD-10-CM | POA: Diagnosis not present

## 2019-04-29 DIAGNOSIS — Z993 Dependence on wheelchair: Secondary | ICD-10-CM | POA: Diagnosis not present

## 2019-04-29 DIAGNOSIS — G834 Cauda equina syndrome: Secondary | ICD-10-CM | POA: Diagnosis not present

## 2019-04-29 DIAGNOSIS — Z466 Encounter for fitting and adjustment of urinary device: Secondary | ICD-10-CM | POA: Diagnosis not present

## 2019-04-29 DIAGNOSIS — Z792 Long term (current) use of antibiotics: Secondary | ICD-10-CM | POA: Diagnosis not present

## 2019-04-29 DIAGNOSIS — Z791 Long term (current) use of non-steroidal anti-inflammatories (NSAID): Secondary | ICD-10-CM | POA: Diagnosis not present

## 2019-04-29 DIAGNOSIS — G8250 Quadriplegia, unspecified: Secondary | ICD-10-CM | POA: Diagnosis not present

## 2019-04-30 DIAGNOSIS — Z993 Dependence on wheelchair: Secondary | ICD-10-CM | POA: Diagnosis not present

## 2019-04-30 DIAGNOSIS — G834 Cauda equina syndrome: Secondary | ICD-10-CM | POA: Diagnosis not present

## 2019-04-30 DIAGNOSIS — Z792 Long term (current) use of antibiotics: Secondary | ICD-10-CM | POA: Diagnosis not present

## 2019-04-30 DIAGNOSIS — Z791 Long term (current) use of non-steroidal anti-inflammatories (NSAID): Secondary | ICD-10-CM | POA: Diagnosis not present

## 2019-04-30 DIAGNOSIS — G8250 Quadriplegia, unspecified: Secondary | ICD-10-CM | POA: Diagnosis not present

## 2019-04-30 DIAGNOSIS — Z466 Encounter for fitting and adjustment of urinary device: Secondary | ICD-10-CM | POA: Diagnosis not present

## 2019-05-02 DIAGNOSIS — G8250 Quadriplegia, unspecified: Secondary | ICD-10-CM | POA: Diagnosis not present

## 2019-05-02 DIAGNOSIS — Z466 Encounter for fitting and adjustment of urinary device: Secondary | ICD-10-CM | POA: Diagnosis not present

## 2019-05-02 DIAGNOSIS — G834 Cauda equina syndrome: Secondary | ICD-10-CM | POA: Diagnosis not present

## 2019-05-02 DIAGNOSIS — Z993 Dependence on wheelchair: Secondary | ICD-10-CM | POA: Diagnosis not present

## 2019-05-02 DIAGNOSIS — Z792 Long term (current) use of antibiotics: Secondary | ICD-10-CM | POA: Diagnosis not present

## 2019-05-02 DIAGNOSIS — Z791 Long term (current) use of non-steroidal anti-inflammatories (NSAID): Secondary | ICD-10-CM | POA: Diagnosis not present

## 2019-05-04 DIAGNOSIS — Z993 Dependence on wheelchair: Secondary | ICD-10-CM | POA: Diagnosis not present

## 2019-05-04 DIAGNOSIS — G8250 Quadriplegia, unspecified: Secondary | ICD-10-CM | POA: Diagnosis not present

## 2019-05-04 DIAGNOSIS — G834 Cauda equina syndrome: Secondary | ICD-10-CM | POA: Diagnosis not present

## 2019-05-04 DIAGNOSIS — Z466 Encounter for fitting and adjustment of urinary device: Secondary | ICD-10-CM | POA: Diagnosis not present

## 2019-05-04 DIAGNOSIS — Z792 Long term (current) use of antibiotics: Secondary | ICD-10-CM | POA: Diagnosis not present

## 2019-05-04 DIAGNOSIS — Z791 Long term (current) use of non-steroidal anti-inflammatories (NSAID): Secondary | ICD-10-CM | POA: Diagnosis not present

## 2019-05-07 DIAGNOSIS — G834 Cauda equina syndrome: Secondary | ICD-10-CM | POA: Diagnosis not present

## 2019-05-07 DIAGNOSIS — Z466 Encounter for fitting and adjustment of urinary device: Secondary | ICD-10-CM | POA: Diagnosis not present

## 2019-05-07 DIAGNOSIS — Z993 Dependence on wheelchair: Secondary | ICD-10-CM | POA: Diagnosis not present

## 2019-05-07 DIAGNOSIS — Z791 Long term (current) use of non-steroidal anti-inflammatories (NSAID): Secondary | ICD-10-CM | POA: Diagnosis not present

## 2019-05-07 DIAGNOSIS — Z792 Long term (current) use of antibiotics: Secondary | ICD-10-CM | POA: Diagnosis not present

## 2019-05-07 DIAGNOSIS — G8250 Quadriplegia, unspecified: Secondary | ICD-10-CM | POA: Diagnosis not present

## 2019-05-09 DIAGNOSIS — Z466 Encounter for fitting and adjustment of urinary device: Secondary | ICD-10-CM | POA: Diagnosis not present

## 2019-05-09 DIAGNOSIS — G8250 Quadriplegia, unspecified: Secondary | ICD-10-CM | POA: Diagnosis not present

## 2019-05-09 DIAGNOSIS — G834 Cauda equina syndrome: Secondary | ICD-10-CM | POA: Diagnosis not present

## 2019-05-09 DIAGNOSIS — Z993 Dependence on wheelchair: Secondary | ICD-10-CM | POA: Diagnosis not present

## 2019-05-09 DIAGNOSIS — Z791 Long term (current) use of non-steroidal anti-inflammatories (NSAID): Secondary | ICD-10-CM | POA: Diagnosis not present

## 2019-05-09 DIAGNOSIS — Z792 Long term (current) use of antibiotics: Secondary | ICD-10-CM | POA: Diagnosis not present

## 2019-05-09 DIAGNOSIS — N3010 Interstitial cystitis (chronic) without hematuria: Secondary | ICD-10-CM | POA: Diagnosis not present

## 2019-05-09 DIAGNOSIS — R3911 Hesitancy of micturition: Secondary | ICD-10-CM | POA: Diagnosis not present

## 2019-05-11 DIAGNOSIS — G8250 Quadriplegia, unspecified: Secondary | ICD-10-CM | POA: Diagnosis not present

## 2019-05-11 DIAGNOSIS — Z791 Long term (current) use of non-steroidal anti-inflammatories (NSAID): Secondary | ICD-10-CM | POA: Diagnosis not present

## 2019-05-11 DIAGNOSIS — Z466 Encounter for fitting and adjustment of urinary device: Secondary | ICD-10-CM | POA: Diagnosis not present

## 2019-05-11 DIAGNOSIS — G834 Cauda equina syndrome: Secondary | ICD-10-CM | POA: Diagnosis not present

## 2019-05-11 DIAGNOSIS — Z792 Long term (current) use of antibiotics: Secondary | ICD-10-CM | POA: Diagnosis not present

## 2019-05-11 DIAGNOSIS — Z993 Dependence on wheelchair: Secondary | ICD-10-CM | POA: Diagnosis not present

## 2019-05-14 DIAGNOSIS — Z791 Long term (current) use of non-steroidal anti-inflammatories (NSAID): Secondary | ICD-10-CM | POA: Diagnosis not present

## 2019-05-14 DIAGNOSIS — Z466 Encounter for fitting and adjustment of urinary device: Secondary | ICD-10-CM | POA: Diagnosis not present

## 2019-05-14 DIAGNOSIS — Z993 Dependence on wheelchair: Secondary | ICD-10-CM | POA: Diagnosis not present

## 2019-05-14 DIAGNOSIS — Z792 Long term (current) use of antibiotics: Secondary | ICD-10-CM | POA: Diagnosis not present

## 2019-05-14 DIAGNOSIS — G834 Cauda equina syndrome: Secondary | ICD-10-CM | POA: Diagnosis not present

## 2019-05-14 DIAGNOSIS — G8250 Quadriplegia, unspecified: Secondary | ICD-10-CM | POA: Diagnosis not present

## 2019-05-16 DIAGNOSIS — Z792 Long term (current) use of antibiotics: Secondary | ICD-10-CM | POA: Diagnosis not present

## 2019-05-16 DIAGNOSIS — G834 Cauda equina syndrome: Secondary | ICD-10-CM | POA: Diagnosis not present

## 2019-05-16 DIAGNOSIS — Z791 Long term (current) use of non-steroidal anti-inflammatories (NSAID): Secondary | ICD-10-CM | POA: Diagnosis not present

## 2019-05-16 DIAGNOSIS — Z993 Dependence on wheelchair: Secondary | ICD-10-CM | POA: Diagnosis not present

## 2019-05-16 DIAGNOSIS — G8250 Quadriplegia, unspecified: Secondary | ICD-10-CM | POA: Diagnosis not present

## 2019-05-16 DIAGNOSIS — Z466 Encounter for fitting and adjustment of urinary device: Secondary | ICD-10-CM | POA: Diagnosis not present

## 2019-05-18 DIAGNOSIS — G834 Cauda equina syndrome: Secondary | ICD-10-CM | POA: Diagnosis not present

## 2019-05-18 DIAGNOSIS — Z993 Dependence on wheelchair: Secondary | ICD-10-CM | POA: Diagnosis not present

## 2019-05-18 DIAGNOSIS — Z792 Long term (current) use of antibiotics: Secondary | ICD-10-CM | POA: Diagnosis not present

## 2019-05-18 DIAGNOSIS — G8250 Quadriplegia, unspecified: Secondary | ICD-10-CM | POA: Diagnosis not present

## 2019-05-18 DIAGNOSIS — Z466 Encounter for fitting and adjustment of urinary device: Secondary | ICD-10-CM | POA: Diagnosis not present

## 2019-05-18 DIAGNOSIS — Z791 Long term (current) use of non-steroidal anti-inflammatories (NSAID): Secondary | ICD-10-CM | POA: Diagnosis not present

## 2019-05-21 DIAGNOSIS — G834 Cauda equina syndrome: Secondary | ICD-10-CM | POA: Diagnosis not present

## 2019-05-21 DIAGNOSIS — Z466 Encounter for fitting and adjustment of urinary device: Secondary | ICD-10-CM | POA: Diagnosis not present

## 2019-05-21 DIAGNOSIS — G8250 Quadriplegia, unspecified: Secondary | ICD-10-CM | POA: Diagnosis not present

## 2019-05-21 DIAGNOSIS — Z993 Dependence on wheelchair: Secondary | ICD-10-CM | POA: Diagnosis not present

## 2019-05-21 DIAGNOSIS — Z791 Long term (current) use of non-steroidal anti-inflammatories (NSAID): Secondary | ICD-10-CM | POA: Diagnosis not present

## 2019-05-21 DIAGNOSIS — Z792 Long term (current) use of antibiotics: Secondary | ICD-10-CM | POA: Diagnosis not present

## 2019-05-22 DIAGNOSIS — G8250 Quadriplegia, unspecified: Secondary | ICD-10-CM | POA: Diagnosis not present

## 2019-05-22 DIAGNOSIS — Z466 Encounter for fitting and adjustment of urinary device: Secondary | ICD-10-CM | POA: Diagnosis not present

## 2019-05-22 DIAGNOSIS — Z791 Long term (current) use of non-steroidal anti-inflammatories (NSAID): Secondary | ICD-10-CM | POA: Diagnosis not present

## 2019-05-22 DIAGNOSIS — G834 Cauda equina syndrome: Secondary | ICD-10-CM | POA: Diagnosis not present

## 2019-05-22 DIAGNOSIS — Z993 Dependence on wheelchair: Secondary | ICD-10-CM | POA: Diagnosis not present

## 2019-05-22 DIAGNOSIS — Z792 Long term (current) use of antibiotics: Secondary | ICD-10-CM | POA: Diagnosis not present

## 2019-05-23 DIAGNOSIS — Z791 Long term (current) use of non-steroidal anti-inflammatories (NSAID): Secondary | ICD-10-CM | POA: Diagnosis not present

## 2019-05-23 DIAGNOSIS — Z792 Long term (current) use of antibiotics: Secondary | ICD-10-CM | POA: Diagnosis not present

## 2019-05-23 DIAGNOSIS — Z993 Dependence on wheelchair: Secondary | ICD-10-CM | POA: Diagnosis not present

## 2019-05-23 DIAGNOSIS — G834 Cauda equina syndrome: Secondary | ICD-10-CM | POA: Diagnosis not present

## 2019-05-23 DIAGNOSIS — G8250 Quadriplegia, unspecified: Secondary | ICD-10-CM | POA: Diagnosis not present

## 2019-05-23 DIAGNOSIS — Z466 Encounter for fitting and adjustment of urinary device: Secondary | ICD-10-CM | POA: Diagnosis not present

## 2019-05-25 DIAGNOSIS — G8250 Quadriplegia, unspecified: Secondary | ICD-10-CM | POA: Diagnosis not present

## 2019-05-25 DIAGNOSIS — Z993 Dependence on wheelchair: Secondary | ICD-10-CM | POA: Diagnosis not present

## 2019-05-25 DIAGNOSIS — Z792 Long term (current) use of antibiotics: Secondary | ICD-10-CM | POA: Diagnosis not present

## 2019-05-25 DIAGNOSIS — Z791 Long term (current) use of non-steroidal anti-inflammatories (NSAID): Secondary | ICD-10-CM | POA: Diagnosis not present

## 2019-05-25 DIAGNOSIS — G834 Cauda equina syndrome: Secondary | ICD-10-CM | POA: Diagnosis not present

## 2019-05-25 DIAGNOSIS — Z466 Encounter for fitting and adjustment of urinary device: Secondary | ICD-10-CM | POA: Diagnosis not present

## 2019-05-28 DIAGNOSIS — G8250 Quadriplegia, unspecified: Secondary | ICD-10-CM | POA: Diagnosis not present

## 2019-05-28 DIAGNOSIS — Z792 Long term (current) use of antibiotics: Secondary | ICD-10-CM | POA: Diagnosis not present

## 2019-05-28 DIAGNOSIS — R339 Retention of urine, unspecified: Secondary | ICD-10-CM | POA: Diagnosis not present

## 2019-05-28 DIAGNOSIS — G834 Cauda equina syndrome: Secondary | ICD-10-CM | POA: Diagnosis not present

## 2019-05-28 DIAGNOSIS — Z466 Encounter for fitting and adjustment of urinary device: Secondary | ICD-10-CM | POA: Diagnosis not present

## 2019-05-28 DIAGNOSIS — Z87440 Personal history of urinary (tract) infections: Secondary | ICD-10-CM | POA: Diagnosis not present

## 2019-05-28 DIAGNOSIS — Z791 Long term (current) use of non-steroidal anti-inflammatories (NSAID): Secondary | ICD-10-CM | POA: Diagnosis not present

## 2019-05-28 DIAGNOSIS — Z993 Dependence on wheelchair: Secondary | ICD-10-CM | POA: Diagnosis not present

## 2019-05-30 DIAGNOSIS — R339 Retention of urine, unspecified: Secondary | ICD-10-CM | POA: Diagnosis not present

## 2019-05-30 DIAGNOSIS — Z87440 Personal history of urinary (tract) infections: Secondary | ICD-10-CM | POA: Diagnosis not present

## 2019-05-30 DIAGNOSIS — G834 Cauda equina syndrome: Secondary | ICD-10-CM | POA: Diagnosis not present

## 2019-05-30 DIAGNOSIS — Z993 Dependence on wheelchair: Secondary | ICD-10-CM | POA: Diagnosis not present

## 2019-05-30 DIAGNOSIS — G8250 Quadriplegia, unspecified: Secondary | ICD-10-CM | POA: Diagnosis not present

## 2019-05-30 DIAGNOSIS — Z792 Long term (current) use of antibiotics: Secondary | ICD-10-CM | POA: Diagnosis not present

## 2019-05-30 DIAGNOSIS — Z791 Long term (current) use of non-steroidal anti-inflammatories (NSAID): Secondary | ICD-10-CM | POA: Diagnosis not present

## 2019-05-30 DIAGNOSIS — Z466 Encounter for fitting and adjustment of urinary device: Secondary | ICD-10-CM | POA: Diagnosis not present

## 2019-06-01 DIAGNOSIS — G834 Cauda equina syndrome: Secondary | ICD-10-CM | POA: Diagnosis not present

## 2019-06-01 DIAGNOSIS — G8250 Quadriplegia, unspecified: Secondary | ICD-10-CM | POA: Diagnosis not present

## 2019-06-01 DIAGNOSIS — Z466 Encounter for fitting and adjustment of urinary device: Secondary | ICD-10-CM | POA: Diagnosis not present

## 2019-06-01 DIAGNOSIS — Z791 Long term (current) use of non-steroidal anti-inflammatories (NSAID): Secondary | ICD-10-CM | POA: Diagnosis not present

## 2019-06-01 DIAGNOSIS — Z993 Dependence on wheelchair: Secondary | ICD-10-CM | POA: Diagnosis not present

## 2019-06-01 DIAGNOSIS — Z792 Long term (current) use of antibiotics: Secondary | ICD-10-CM | POA: Diagnosis not present

## 2019-06-03 IMAGING — MG MM DIGITAL SCREENING BILAT W/ CAD
5 series · 5 of 5 positions shown · non-contrast
Comparison: Previous exam(s).

CLINICAL DATA: Screening.

EXAM:
DIGITAL SCREENING BILATERAL MAMMOGRAM WITH CAD

[L XCCL]
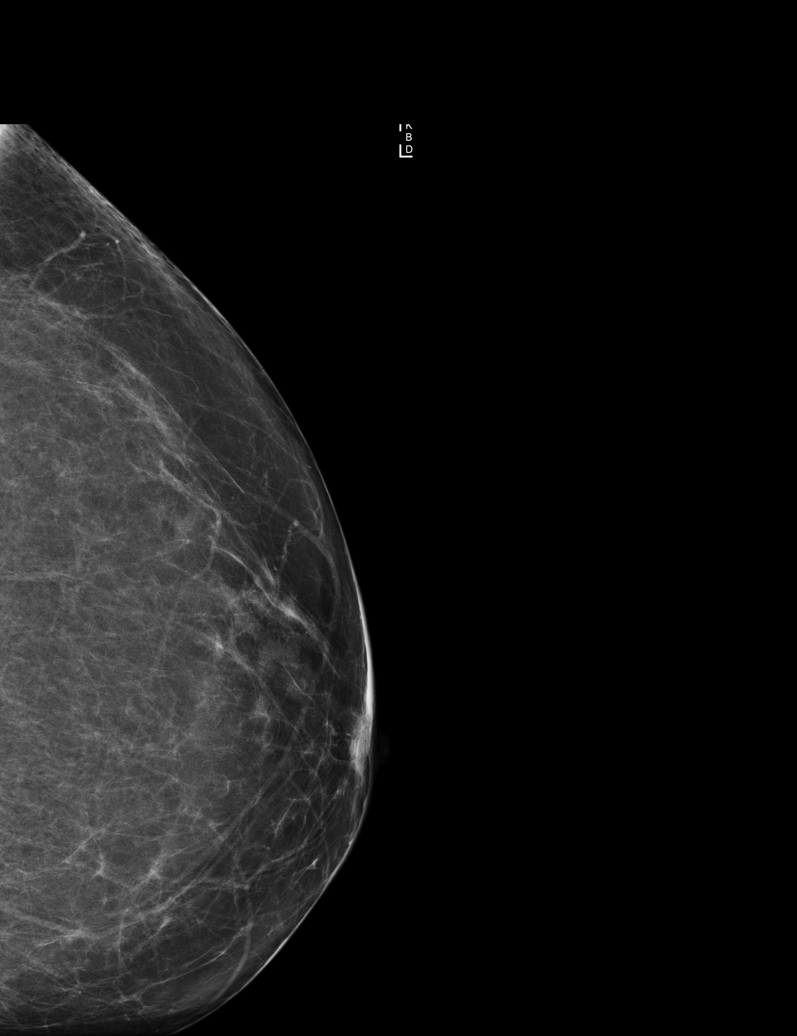

[R CC]
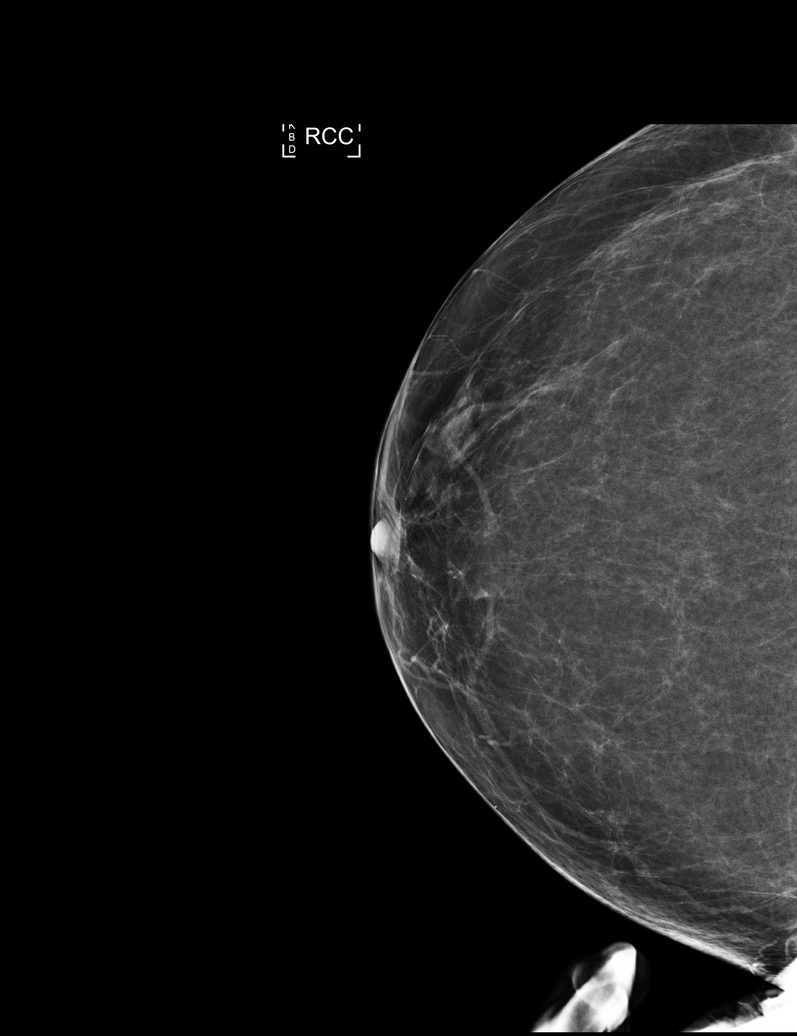

[R MLO]
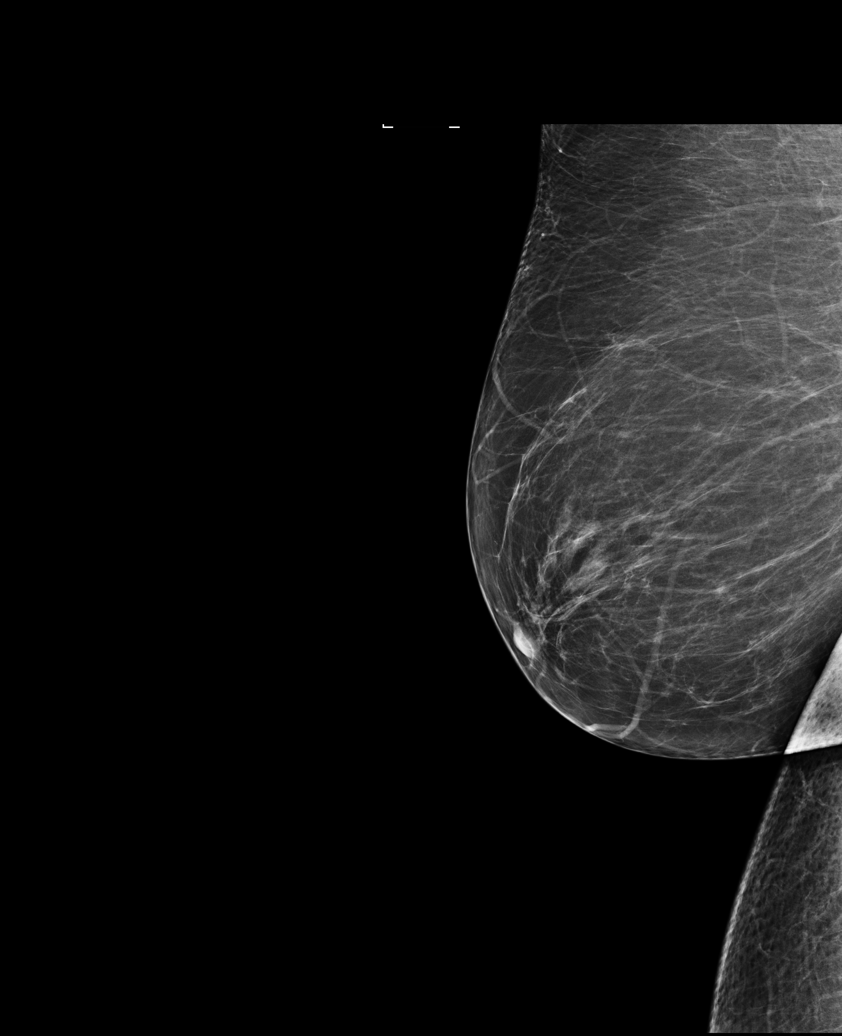

[L MLO]
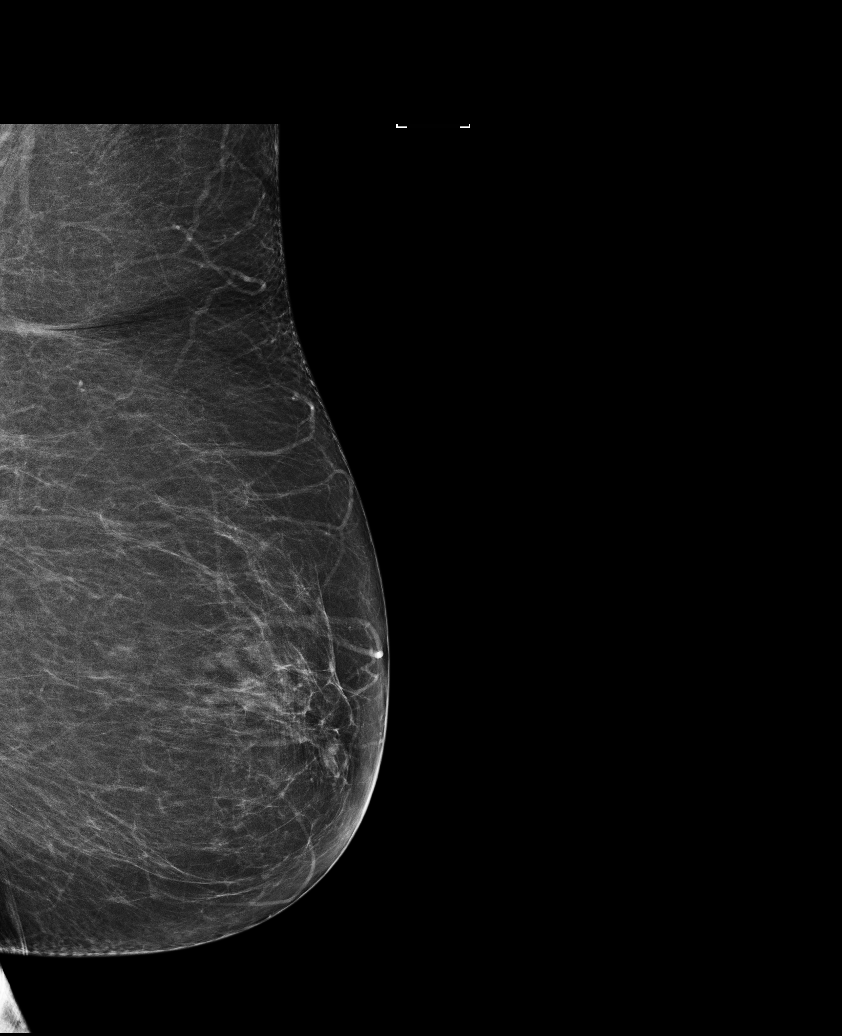

[L CC]
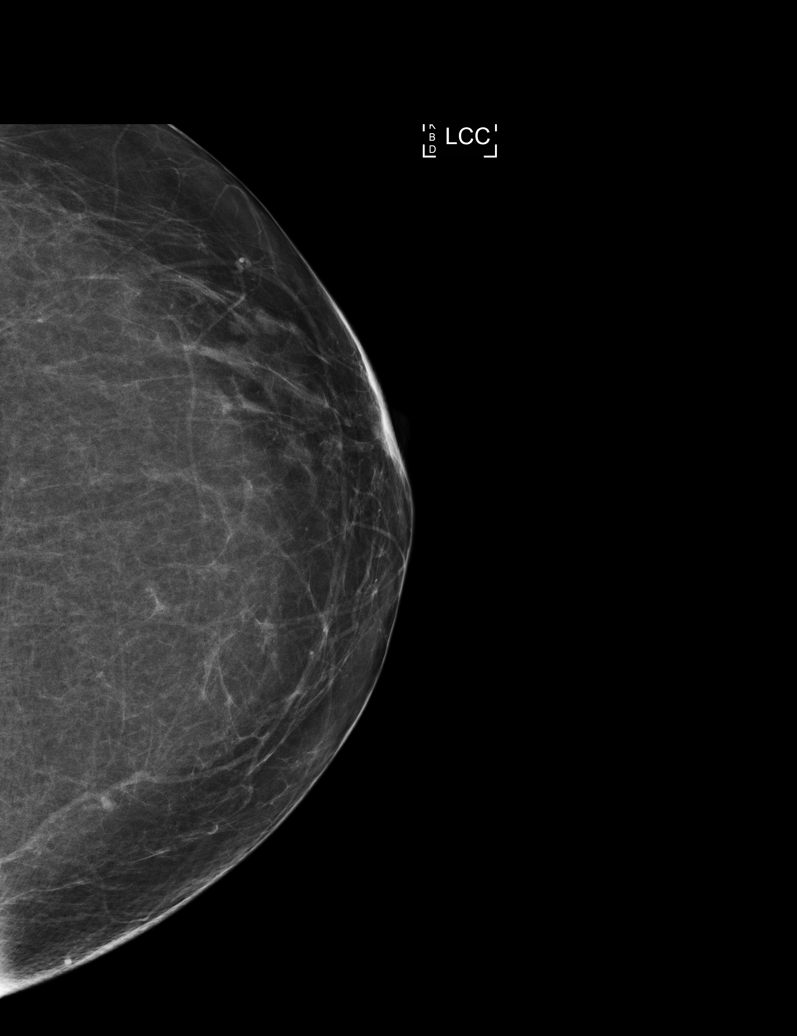

[5 of 5 positions shown; findings below may reference images not displayed]

ACR Breast Density Category b: There are scattered areas of
fibroglandular density.
FINDINGS: There are no findings suspicious for malignancy. Images were
processed with CAD.
IMPRESSION: No mammographic evidence of malignancy. A result letter of this
screening mammogram will be mailed directly to the patient.

RECOMMENDATION:
Screening mammogram in one year. (Code:AS-G-LCT)

BI-RADS CATEGORY  1: Negative.

## 2019-06-04 ENCOUNTER — Ambulatory Visit: Payer: Medicare Other | Attending: Internal Medicine

## 2019-06-04 ENCOUNTER — Other Ambulatory Visit: Payer: Self-pay

## 2019-06-04 DIAGNOSIS — Z23 Encounter for immunization: Secondary | ICD-10-CM

## 2019-06-04 DIAGNOSIS — Z993 Dependence on wheelchair: Secondary | ICD-10-CM | POA: Diagnosis not present

## 2019-06-04 DIAGNOSIS — Z466 Encounter for fitting and adjustment of urinary device: Secondary | ICD-10-CM | POA: Diagnosis not present

## 2019-06-04 DIAGNOSIS — Z791 Long term (current) use of non-steroidal anti-inflammatories (NSAID): Secondary | ICD-10-CM | POA: Diagnosis not present

## 2019-06-04 DIAGNOSIS — Z792 Long term (current) use of antibiotics: Secondary | ICD-10-CM | POA: Diagnosis not present

## 2019-06-04 DIAGNOSIS — G8250 Quadriplegia, unspecified: Secondary | ICD-10-CM | POA: Diagnosis not present

## 2019-06-04 DIAGNOSIS — G834 Cauda equina syndrome: Secondary | ICD-10-CM | POA: Diagnosis not present

## 2019-06-04 NOTE — Progress Notes (Signed)
   Covid-19 Vaccination Clinic  Name:  GENIKA TSUTSUMI    MRN: [id number] DOB: 1960-04-09  06/04/2019  Ms. Mullan was observed post Covid-19 immunization for 15 minutes without incident. She was provided with Vaccine Information Sheet and instruction to access the V-Safe system.   Ms. Splitt was instructed to call 911 with any severe reactions post vaccine: Marland Kitchen Difficulty breathing  . Swelling of face and throat  . A fast heartbeat  . A bad rash all over body  . Dizziness and weakness   Immunizations Administered    Name Date Dose VIS Date Route   Pfizer COVID-19 Vaccine 06/04/2019 11:04 AM 0.3 mL 02/16/2019 Intramuscular   Manufacturer: [place]   Lot: [id number]   [place]: [id number]

## 2019-06-06 DIAGNOSIS — Z791 Long term (current) use of non-steroidal anti-inflammatories (NSAID): Secondary | ICD-10-CM | POA: Diagnosis not present

## 2019-06-06 DIAGNOSIS — Z466 Encounter for fitting and adjustment of urinary device: Secondary | ICD-10-CM | POA: Diagnosis not present

## 2019-06-06 DIAGNOSIS — Z792 Long term (current) use of antibiotics: Secondary | ICD-10-CM | POA: Diagnosis not present

## 2019-06-06 DIAGNOSIS — G8250 Quadriplegia, unspecified: Secondary | ICD-10-CM | POA: Diagnosis not present

## 2019-06-06 DIAGNOSIS — G834 Cauda equina syndrome: Secondary | ICD-10-CM | POA: Diagnosis not present

## 2019-06-06 DIAGNOSIS — Z993 Dependence on wheelchair: Secondary | ICD-10-CM | POA: Diagnosis not present

## 2019-06-08 DIAGNOSIS — Z993 Dependence on wheelchair: Secondary | ICD-10-CM | POA: Diagnosis not present

## 2019-06-08 DIAGNOSIS — G834 Cauda equina syndrome: Secondary | ICD-10-CM | POA: Diagnosis not present

## 2019-06-08 DIAGNOSIS — Z792 Long term (current) use of antibiotics: Secondary | ICD-10-CM | POA: Diagnosis not present

## 2019-06-08 DIAGNOSIS — G8250 Quadriplegia, unspecified: Secondary | ICD-10-CM | POA: Diagnosis not present

## 2019-06-08 DIAGNOSIS — Z791 Long term (current) use of non-steroidal anti-inflammatories (NSAID): Secondary | ICD-10-CM | POA: Diagnosis not present

## 2019-06-08 DIAGNOSIS — Z466 Encounter for fitting and adjustment of urinary device: Secondary | ICD-10-CM | POA: Diagnosis not present

## 2019-06-11 DIAGNOSIS — Z993 Dependence on wheelchair: Secondary | ICD-10-CM | POA: Diagnosis not present

## 2019-06-11 DIAGNOSIS — G8250 Quadriplegia, unspecified: Secondary | ICD-10-CM | POA: Diagnosis not present

## 2019-06-11 DIAGNOSIS — Z791 Long term (current) use of non-steroidal anti-inflammatories (NSAID): Secondary | ICD-10-CM | POA: Diagnosis not present

## 2019-06-11 DIAGNOSIS — Z792 Long term (current) use of antibiotics: Secondary | ICD-10-CM | POA: Diagnosis not present

## 2019-06-11 DIAGNOSIS — Z466 Encounter for fitting and adjustment of urinary device: Secondary | ICD-10-CM | POA: Diagnosis not present

## 2019-06-11 DIAGNOSIS — G834 Cauda equina syndrome: Secondary | ICD-10-CM | POA: Diagnosis not present

## 2019-06-12 DIAGNOSIS — Z792 Long term (current) use of antibiotics: Secondary | ICD-10-CM | POA: Diagnosis not present

## 2019-06-12 DIAGNOSIS — G834 Cauda equina syndrome: Secondary | ICD-10-CM | POA: Diagnosis not present

## 2019-06-12 DIAGNOSIS — Z993 Dependence on wheelchair: Secondary | ICD-10-CM | POA: Diagnosis not present

## 2019-06-12 DIAGNOSIS — G8250 Quadriplegia, unspecified: Secondary | ICD-10-CM | POA: Diagnosis not present

## 2019-06-12 DIAGNOSIS — Z466 Encounter for fitting and adjustment of urinary device: Secondary | ICD-10-CM | POA: Diagnosis not present

## 2019-06-12 DIAGNOSIS — Z791 Long term (current) use of non-steroidal anti-inflammatories (NSAID): Secondary | ICD-10-CM | POA: Diagnosis not present

## 2019-06-13 DIAGNOSIS — G8250 Quadriplegia, unspecified: Secondary | ICD-10-CM | POA: Diagnosis not present

## 2019-06-13 DIAGNOSIS — Z466 Encounter for fitting and adjustment of urinary device: Secondary | ICD-10-CM | POA: Diagnosis not present

## 2019-06-13 DIAGNOSIS — G834 Cauda equina syndrome: Secondary | ICD-10-CM | POA: Diagnosis not present

## 2019-06-13 DIAGNOSIS — R6889 Other general symptoms and signs: Secondary | ICD-10-CM | POA: Diagnosis not present

## 2019-06-13 DIAGNOSIS — Z792 Long term (current) use of antibiotics: Secondary | ICD-10-CM | POA: Diagnosis not present

## 2019-06-13 DIAGNOSIS — Z791 Long term (current) use of non-steroidal anti-inflammatories (NSAID): Secondary | ICD-10-CM | POA: Diagnosis not present

## 2019-06-13 DIAGNOSIS — Z993 Dependence on wheelchair: Secondary | ICD-10-CM | POA: Diagnosis not present

## 2019-06-15 DIAGNOSIS — Z466 Encounter for fitting and adjustment of urinary device: Secondary | ICD-10-CM | POA: Diagnosis not present

## 2019-06-15 DIAGNOSIS — Z792 Long term (current) use of antibiotics: Secondary | ICD-10-CM | POA: Diagnosis not present

## 2019-06-15 DIAGNOSIS — G834 Cauda equina syndrome: Secondary | ICD-10-CM | POA: Diagnosis not present

## 2019-06-15 DIAGNOSIS — Z791 Long term (current) use of non-steroidal anti-inflammatories (NSAID): Secondary | ICD-10-CM | POA: Diagnosis not present

## 2019-06-15 DIAGNOSIS — Z993 Dependence on wheelchair: Secondary | ICD-10-CM | POA: Diagnosis not present

## 2019-06-15 DIAGNOSIS — G8250 Quadriplegia, unspecified: Secondary | ICD-10-CM | POA: Diagnosis not present

## 2019-06-18 DIAGNOSIS — Z466 Encounter for fitting and adjustment of urinary device: Secondary | ICD-10-CM | POA: Diagnosis not present

## 2019-06-18 DIAGNOSIS — G834 Cauda equina syndrome: Secondary | ICD-10-CM | POA: Diagnosis not present

## 2019-06-18 DIAGNOSIS — Z792 Long term (current) use of antibiotics: Secondary | ICD-10-CM | POA: Diagnosis not present

## 2019-06-18 DIAGNOSIS — G8250 Quadriplegia, unspecified: Secondary | ICD-10-CM | POA: Diagnosis not present

## 2019-06-18 DIAGNOSIS — Z791 Long term (current) use of non-steroidal anti-inflammatories (NSAID): Secondary | ICD-10-CM | POA: Diagnosis not present

## 2019-06-18 DIAGNOSIS — Z993 Dependence on wheelchair: Secondary | ICD-10-CM | POA: Diagnosis not present

## 2019-06-20 DIAGNOSIS — G8250 Quadriplegia, unspecified: Secondary | ICD-10-CM | POA: Diagnosis not present

## 2019-06-20 DIAGNOSIS — Z792 Long term (current) use of antibiotics: Secondary | ICD-10-CM | POA: Diagnosis not present

## 2019-06-20 DIAGNOSIS — Z466 Encounter for fitting and adjustment of urinary device: Secondary | ICD-10-CM | POA: Diagnosis not present

## 2019-06-20 DIAGNOSIS — Z993 Dependence on wheelchair: Secondary | ICD-10-CM | POA: Diagnosis not present

## 2019-06-20 DIAGNOSIS — Z791 Long term (current) use of non-steroidal anti-inflammatories (NSAID): Secondary | ICD-10-CM | POA: Diagnosis not present

## 2019-06-20 DIAGNOSIS — G834 Cauda equina syndrome: Secondary | ICD-10-CM | POA: Diagnosis not present

## 2019-06-21 DIAGNOSIS — Z791 Long term (current) use of non-steroidal anti-inflammatories (NSAID): Secondary | ICD-10-CM | POA: Diagnosis not present

## 2019-06-21 DIAGNOSIS — Z792 Long term (current) use of antibiotics: Secondary | ICD-10-CM | POA: Diagnosis not present

## 2019-06-21 DIAGNOSIS — Z993 Dependence on wheelchair: Secondary | ICD-10-CM | POA: Diagnosis not present

## 2019-06-21 DIAGNOSIS — G834 Cauda equina syndrome: Secondary | ICD-10-CM | POA: Diagnosis not present

## 2019-06-21 DIAGNOSIS — G8250 Quadriplegia, unspecified: Secondary | ICD-10-CM | POA: Diagnosis not present

## 2019-06-21 DIAGNOSIS — Z466 Encounter for fitting and adjustment of urinary device: Secondary | ICD-10-CM | POA: Diagnosis not present

## 2019-06-22 DIAGNOSIS — G834 Cauda equina syndrome: Secondary | ICD-10-CM | POA: Diagnosis not present

## 2019-06-22 DIAGNOSIS — G8250 Quadriplegia, unspecified: Secondary | ICD-10-CM | POA: Diagnosis not present

## 2019-06-22 DIAGNOSIS — Z792 Long term (current) use of antibiotics: Secondary | ICD-10-CM | POA: Diagnosis not present

## 2019-06-22 DIAGNOSIS — Z993 Dependence on wheelchair: Secondary | ICD-10-CM | POA: Diagnosis not present

## 2019-06-22 DIAGNOSIS — Z791 Long term (current) use of non-steroidal anti-inflammatories (NSAID): Secondary | ICD-10-CM | POA: Diagnosis not present

## 2019-06-22 DIAGNOSIS — Z466 Encounter for fitting and adjustment of urinary device: Secondary | ICD-10-CM | POA: Diagnosis not present

## 2019-06-25 DIAGNOSIS — Z791 Long term (current) use of non-steroidal anti-inflammatories (NSAID): Secondary | ICD-10-CM | POA: Diagnosis not present

## 2019-06-25 DIAGNOSIS — G834 Cauda equina syndrome: Secondary | ICD-10-CM | POA: Diagnosis not present

## 2019-06-25 DIAGNOSIS — Z792 Long term (current) use of antibiotics: Secondary | ICD-10-CM | POA: Diagnosis not present

## 2019-06-25 DIAGNOSIS — G8250 Quadriplegia, unspecified: Secondary | ICD-10-CM | POA: Diagnosis not present

## 2019-06-25 DIAGNOSIS — Z466 Encounter for fitting and adjustment of urinary device: Secondary | ICD-10-CM | POA: Diagnosis not present

## 2019-06-25 DIAGNOSIS — Z993 Dependence on wheelchair: Secondary | ICD-10-CM | POA: Diagnosis not present

## 2019-06-27 ENCOUNTER — Ambulatory Visit: Payer: Medicare Other | Attending: Internal Medicine

## 2019-06-27 DIAGNOSIS — Z466 Encounter for fitting and adjustment of urinary device: Secondary | ICD-10-CM | POA: Diagnosis not present

## 2019-06-27 DIAGNOSIS — Z791 Long term (current) use of non-steroidal anti-inflammatories (NSAID): Secondary | ICD-10-CM | POA: Diagnosis not present

## 2019-06-27 DIAGNOSIS — Z993 Dependence on wheelchair: Secondary | ICD-10-CM | POA: Diagnosis not present

## 2019-06-27 DIAGNOSIS — Z792 Long term (current) use of antibiotics: Secondary | ICD-10-CM | POA: Diagnosis not present

## 2019-06-27 DIAGNOSIS — G8250 Quadriplegia, unspecified: Secondary | ICD-10-CM | POA: Diagnosis not present

## 2019-06-27 DIAGNOSIS — G834 Cauda equina syndrome: Secondary | ICD-10-CM | POA: Diagnosis not present

## 2019-06-27 DIAGNOSIS — Z23 Encounter for immunization: Secondary | ICD-10-CM

## 2019-06-27 NOTE — Progress Notes (Signed)
   Covid-19 Vaccination Clinic  Name:  Heidi Hess    MRN: [id number] DOB: March 10, 1960  06/27/2019  Heidi Hess was observed post Covid-19 immunization for 15 minutes without incident. She was provided with Vaccine Information Sheet and instruction to access the V-Safe system.   Heidi Hess was instructed to call 911 with any severe reactions post vaccine: Marland Kitchen Difficulty breathing  . Swelling of face and throat  . A fast heartbeat  . A bad rash all over body  . Dizziness and weakness   Immunizations Administered    Name Date Dose VIS Date Route   Pfizer COVID-19 Vaccine 06/27/2019 12:42 PM 0.3 mL 05/02/2018 Intramuscular   Manufacturer: [place]   Lot: [id number]   [place]: [id number]

## 2019-06-28 DIAGNOSIS — R339 Retention of urine, unspecified: Secondary | ICD-10-CM | POA: Diagnosis not present

## 2019-06-28 DIAGNOSIS — G8250 Quadriplegia, unspecified: Secondary | ICD-10-CM | POA: Diagnosis not present

## 2019-06-28 DIAGNOSIS — Z87440 Personal history of urinary (tract) infections: Secondary | ICD-10-CM | POA: Diagnosis not present

## 2019-06-29 DIAGNOSIS — Z993 Dependence on wheelchair: Secondary | ICD-10-CM | POA: Diagnosis not present

## 2019-06-29 DIAGNOSIS — Z792 Long term (current) use of antibiotics: Secondary | ICD-10-CM | POA: Diagnosis not present

## 2019-06-29 DIAGNOSIS — G834 Cauda equina syndrome: Secondary | ICD-10-CM | POA: Diagnosis not present

## 2019-06-29 DIAGNOSIS — G8250 Quadriplegia, unspecified: Secondary | ICD-10-CM | POA: Diagnosis not present

## 2019-06-29 DIAGNOSIS — Z466 Encounter for fitting and adjustment of urinary device: Secondary | ICD-10-CM | POA: Diagnosis not present

## 2019-06-29 DIAGNOSIS — Z791 Long term (current) use of non-steroidal anti-inflammatories (NSAID): Secondary | ICD-10-CM | POA: Diagnosis not present

## 2019-07-02 DIAGNOSIS — G834 Cauda equina syndrome: Secondary | ICD-10-CM | POA: Diagnosis not present

## 2019-07-02 DIAGNOSIS — G8250 Quadriplegia, unspecified: Secondary | ICD-10-CM | POA: Diagnosis not present

## 2019-07-02 DIAGNOSIS — Z792 Long term (current) use of antibiotics: Secondary | ICD-10-CM | POA: Diagnosis not present

## 2019-07-02 DIAGNOSIS — Z791 Long term (current) use of non-steroidal anti-inflammatories (NSAID): Secondary | ICD-10-CM | POA: Diagnosis not present

## 2019-07-02 DIAGNOSIS — Z993 Dependence on wheelchair: Secondary | ICD-10-CM | POA: Diagnosis not present

## 2019-07-02 DIAGNOSIS — Z466 Encounter for fitting and adjustment of urinary device: Secondary | ICD-10-CM | POA: Diagnosis not present

## 2019-07-04 DIAGNOSIS — G8250 Quadriplegia, unspecified: Secondary | ICD-10-CM | POA: Diagnosis not present

## 2019-07-04 DIAGNOSIS — Z791 Long term (current) use of non-steroidal anti-inflammatories (NSAID): Secondary | ICD-10-CM | POA: Diagnosis not present

## 2019-07-04 DIAGNOSIS — Z466 Encounter for fitting and adjustment of urinary device: Secondary | ICD-10-CM | POA: Diagnosis not present

## 2019-07-04 DIAGNOSIS — Z792 Long term (current) use of antibiotics: Secondary | ICD-10-CM | POA: Diagnosis not present

## 2019-07-04 DIAGNOSIS — G834 Cauda equina syndrome: Secondary | ICD-10-CM | POA: Diagnosis not present

## 2019-07-04 DIAGNOSIS — Z993 Dependence on wheelchair: Secondary | ICD-10-CM | POA: Diagnosis not present

## 2019-07-05 DIAGNOSIS — G8250 Quadriplegia, unspecified: Secondary | ICD-10-CM | POA: Diagnosis not present

## 2019-07-05 DIAGNOSIS — Z791 Long term (current) use of non-steroidal anti-inflammatories (NSAID): Secondary | ICD-10-CM | POA: Diagnosis not present

## 2019-07-05 DIAGNOSIS — Z466 Encounter for fitting and adjustment of urinary device: Secondary | ICD-10-CM | POA: Diagnosis not present

## 2019-07-05 DIAGNOSIS — G834 Cauda equina syndrome: Secondary | ICD-10-CM | POA: Diagnosis not present

## 2019-07-05 DIAGNOSIS — Z993 Dependence on wheelchair: Secondary | ICD-10-CM | POA: Diagnosis not present

## 2019-07-05 DIAGNOSIS — Z792 Long term (current) use of antibiotics: Secondary | ICD-10-CM | POA: Diagnosis not present

## 2019-07-06 DIAGNOSIS — Z791 Long term (current) use of non-steroidal anti-inflammatories (NSAID): Secondary | ICD-10-CM | POA: Diagnosis not present

## 2019-07-06 DIAGNOSIS — G834 Cauda equina syndrome: Secondary | ICD-10-CM | POA: Diagnosis not present

## 2019-07-06 DIAGNOSIS — Z466 Encounter for fitting and adjustment of urinary device: Secondary | ICD-10-CM | POA: Diagnosis not present

## 2019-07-06 DIAGNOSIS — G8250 Quadriplegia, unspecified: Secondary | ICD-10-CM | POA: Diagnosis not present

## 2019-07-06 DIAGNOSIS — Z993 Dependence on wheelchair: Secondary | ICD-10-CM | POA: Diagnosis not present

## 2019-07-06 DIAGNOSIS — Z792 Long term (current) use of antibiotics: Secondary | ICD-10-CM | POA: Diagnosis not present

## 2019-07-09 DIAGNOSIS — Z791 Long term (current) use of non-steroidal anti-inflammatories (NSAID): Secondary | ICD-10-CM | POA: Diagnosis not present

## 2019-07-09 DIAGNOSIS — G834 Cauda equina syndrome: Secondary | ICD-10-CM | POA: Diagnosis not present

## 2019-07-09 DIAGNOSIS — Z792 Long term (current) use of antibiotics: Secondary | ICD-10-CM | POA: Diagnosis not present

## 2019-07-09 DIAGNOSIS — G8250 Quadriplegia, unspecified: Secondary | ICD-10-CM | POA: Diagnosis not present

## 2019-07-09 DIAGNOSIS — Z993 Dependence on wheelchair: Secondary | ICD-10-CM | POA: Diagnosis not present

## 2019-07-09 DIAGNOSIS — Z466 Encounter for fitting and adjustment of urinary device: Secondary | ICD-10-CM | POA: Diagnosis not present

## 2019-07-11 DIAGNOSIS — G834 Cauda equina syndrome: Secondary | ICD-10-CM | POA: Diagnosis not present

## 2019-07-11 DIAGNOSIS — Z792 Long term (current) use of antibiotics: Secondary | ICD-10-CM | POA: Diagnosis not present

## 2019-07-11 DIAGNOSIS — Z791 Long term (current) use of non-steroidal anti-inflammatories (NSAID): Secondary | ICD-10-CM | POA: Diagnosis not present

## 2019-07-11 DIAGNOSIS — Z993 Dependence on wheelchair: Secondary | ICD-10-CM | POA: Diagnosis not present

## 2019-07-11 DIAGNOSIS — G8250 Quadriplegia, unspecified: Secondary | ICD-10-CM | POA: Diagnosis not present

## 2019-07-11 DIAGNOSIS — Z466 Encounter for fitting and adjustment of urinary device: Secondary | ICD-10-CM | POA: Diagnosis not present

## 2019-07-13 DIAGNOSIS — G834 Cauda equina syndrome: Secondary | ICD-10-CM | POA: Diagnosis not present

## 2019-07-13 DIAGNOSIS — Z993 Dependence on wheelchair: Secondary | ICD-10-CM | POA: Diagnosis not present

## 2019-07-13 DIAGNOSIS — Z466 Encounter for fitting and adjustment of urinary device: Secondary | ICD-10-CM | POA: Diagnosis not present

## 2019-07-13 DIAGNOSIS — Z792 Long term (current) use of antibiotics: Secondary | ICD-10-CM | POA: Diagnosis not present

## 2019-07-13 DIAGNOSIS — Z791 Long term (current) use of non-steroidal anti-inflammatories (NSAID): Secondary | ICD-10-CM | POA: Diagnosis not present

## 2019-07-13 DIAGNOSIS — G8250 Quadriplegia, unspecified: Secondary | ICD-10-CM | POA: Diagnosis not present

## 2019-07-16 DIAGNOSIS — Z792 Long term (current) use of antibiotics: Secondary | ICD-10-CM | POA: Diagnosis not present

## 2019-07-16 DIAGNOSIS — G8250 Quadriplegia, unspecified: Secondary | ICD-10-CM | POA: Diagnosis not present

## 2019-07-16 DIAGNOSIS — G834 Cauda equina syndrome: Secondary | ICD-10-CM | POA: Diagnosis not present

## 2019-07-16 DIAGNOSIS — Z993 Dependence on wheelchair: Secondary | ICD-10-CM | POA: Diagnosis not present

## 2019-07-16 DIAGNOSIS — Z466 Encounter for fitting and adjustment of urinary device: Secondary | ICD-10-CM | POA: Diagnosis not present

## 2019-07-16 DIAGNOSIS — Z791 Long term (current) use of non-steroidal anti-inflammatories (NSAID): Secondary | ICD-10-CM | POA: Diagnosis not present

## 2019-07-17 DIAGNOSIS — Z791 Long term (current) use of non-steroidal anti-inflammatories (NSAID): Secondary | ICD-10-CM | POA: Diagnosis not present

## 2019-07-17 DIAGNOSIS — G834 Cauda equina syndrome: Secondary | ICD-10-CM | POA: Diagnosis not present

## 2019-07-17 DIAGNOSIS — Z993 Dependence on wheelchair: Secondary | ICD-10-CM | POA: Diagnosis not present

## 2019-07-17 DIAGNOSIS — Z466 Encounter for fitting and adjustment of urinary device: Secondary | ICD-10-CM | POA: Diagnosis not present

## 2019-07-17 DIAGNOSIS — G8250 Quadriplegia, unspecified: Secondary | ICD-10-CM | POA: Diagnosis not present

## 2019-07-17 DIAGNOSIS — Z792 Long term (current) use of antibiotics: Secondary | ICD-10-CM | POA: Diagnosis not present

## 2019-07-18 DIAGNOSIS — Z466 Encounter for fitting and adjustment of urinary device: Secondary | ICD-10-CM | POA: Diagnosis not present

## 2019-07-18 DIAGNOSIS — Z791 Long term (current) use of non-steroidal anti-inflammatories (NSAID): Secondary | ICD-10-CM | POA: Diagnosis not present

## 2019-07-18 DIAGNOSIS — Z993 Dependence on wheelchair: Secondary | ICD-10-CM | POA: Diagnosis not present

## 2019-07-18 DIAGNOSIS — G8250 Quadriplegia, unspecified: Secondary | ICD-10-CM | POA: Diagnosis not present

## 2019-07-18 DIAGNOSIS — G834 Cauda equina syndrome: Secondary | ICD-10-CM | POA: Diagnosis not present

## 2019-07-18 DIAGNOSIS — Z792 Long term (current) use of antibiotics: Secondary | ICD-10-CM | POA: Diagnosis not present

## 2019-07-19 ENCOUNTER — Telehealth: Payer: Self-pay | Admitting: Internal Medicine

## 2019-07-19 NOTE — Telephone Encounter (Signed)
Heidi Hess called and the home health nurse came out Tuesday and had trouble placing her catheter and wanted to see if she could have an ultrasound of the pelvic area She has left a message with Dr Clarene [facility] at [facility] he is the Oncologist from [place] and warned her that after her hysterectomy it could prolapse. Can she get a recommendation on what to do. She said the reason she called [facility] is because of transferring issues at [facility].

## 2019-07-20 NOTE — Telephone Encounter (Signed)
Spoke with patient this morning and she states that she has an apt at [facility].

## 2019-07-21 DIAGNOSIS — Z466 Encounter for fitting and adjustment of urinary device: Secondary | ICD-10-CM | POA: Diagnosis not present

## 2019-07-21 DIAGNOSIS — Z791 Long term (current) use of non-steroidal anti-inflammatories (NSAID): Secondary | ICD-10-CM | POA: Diagnosis not present

## 2019-07-21 DIAGNOSIS — G834 Cauda equina syndrome: Secondary | ICD-10-CM | POA: Diagnosis not present

## 2019-07-21 DIAGNOSIS — Z993 Dependence on wheelchair: Secondary | ICD-10-CM | POA: Diagnosis not present

## 2019-07-21 DIAGNOSIS — G8250 Quadriplegia, unspecified: Secondary | ICD-10-CM | POA: Diagnosis not present

## 2019-07-21 DIAGNOSIS — Z792 Long term (current) use of antibiotics: Secondary | ICD-10-CM | POA: Diagnosis not present

## 2019-07-23 DIAGNOSIS — G834 Cauda equina syndrome: Secondary | ICD-10-CM | POA: Diagnosis not present

## 2019-07-23 DIAGNOSIS — Z466 Encounter for fitting and adjustment of urinary device: Secondary | ICD-10-CM | POA: Diagnosis not present

## 2019-07-23 DIAGNOSIS — Z791 Long term (current) use of non-steroidal anti-inflammatories (NSAID): Secondary | ICD-10-CM | POA: Diagnosis not present

## 2019-07-23 DIAGNOSIS — Z993 Dependence on wheelchair: Secondary | ICD-10-CM | POA: Diagnosis not present

## 2019-07-23 DIAGNOSIS — G8250 Quadriplegia, unspecified: Secondary | ICD-10-CM | POA: Diagnosis not present

## 2019-07-23 DIAGNOSIS — Z792 Long term (current) use of antibiotics: Secondary | ICD-10-CM | POA: Diagnosis not present

## 2019-07-25 DIAGNOSIS — R6889 Other general symptoms and signs: Secondary | ICD-10-CM | POA: Diagnosis not present

## 2019-07-25 DIAGNOSIS — Z993 Dependence on wheelchair: Secondary | ICD-10-CM | POA: Diagnosis not present

## 2019-07-25 DIAGNOSIS — Z791 Long term (current) use of non-steroidal anti-inflammatories (NSAID): Secondary | ICD-10-CM | POA: Diagnosis not present

## 2019-07-25 DIAGNOSIS — Z466 Encounter for fitting and adjustment of urinary device: Secondary | ICD-10-CM | POA: Diagnosis not present

## 2019-07-25 DIAGNOSIS — G8250 Quadriplegia, unspecified: Secondary | ICD-10-CM | POA: Diagnosis not present

## 2019-07-25 DIAGNOSIS — Z792 Long term (current) use of antibiotics: Secondary | ICD-10-CM | POA: Diagnosis not present

## 2019-07-25 DIAGNOSIS — G834 Cauda equina syndrome: Secondary | ICD-10-CM | POA: Diagnosis not present

## 2019-07-27 DIAGNOSIS — G8250 Quadriplegia, unspecified: Secondary | ICD-10-CM | POA: Diagnosis not present

## 2019-07-27 DIAGNOSIS — Z993 Dependence on wheelchair: Secondary | ICD-10-CM | POA: Diagnosis not present

## 2019-07-27 DIAGNOSIS — G834 Cauda equina syndrome: Secondary | ICD-10-CM | POA: Diagnosis not present

## 2019-07-27 DIAGNOSIS — Z791 Long term (current) use of non-steroidal anti-inflammatories (NSAID): Secondary | ICD-10-CM | POA: Diagnosis not present

## 2019-07-27 DIAGNOSIS — Z792 Long term (current) use of antibiotics: Secondary | ICD-10-CM | POA: Diagnosis not present

## 2019-07-27 DIAGNOSIS — Z466 Encounter for fitting and adjustment of urinary device: Secondary | ICD-10-CM | POA: Diagnosis not present

## 2019-07-30 DIAGNOSIS — Z87440 Personal history of urinary (tract) infections: Secondary | ICD-10-CM | POA: Diagnosis not present

## 2019-07-30 DIAGNOSIS — Z466 Encounter for fitting and adjustment of urinary device: Secondary | ICD-10-CM | POA: Diagnosis not present

## 2019-07-30 DIAGNOSIS — R339 Retention of urine, unspecified: Secondary | ICD-10-CM | POA: Diagnosis not present

## 2019-07-30 DIAGNOSIS — Z993 Dependence on wheelchair: Secondary | ICD-10-CM | POA: Diagnosis not present

## 2019-07-30 DIAGNOSIS — Z791 Long term (current) use of non-steroidal anti-inflammatories (NSAID): Secondary | ICD-10-CM | POA: Diagnosis not present

## 2019-07-30 DIAGNOSIS — Z792 Long term (current) use of antibiotics: Secondary | ICD-10-CM | POA: Diagnosis not present

## 2019-07-30 DIAGNOSIS — G8250 Quadriplegia, unspecified: Secondary | ICD-10-CM | POA: Diagnosis not present

## 2019-07-30 DIAGNOSIS — G834 Cauda equina syndrome: Secondary | ICD-10-CM | POA: Diagnosis not present

## 2019-07-31 DIAGNOSIS — G8250 Quadriplegia, unspecified: Secondary | ICD-10-CM | POA: Diagnosis not present

## 2019-07-31 DIAGNOSIS — Z87440 Personal history of urinary (tract) infections: Secondary | ICD-10-CM | POA: Diagnosis not present

## 2019-07-31 DIAGNOSIS — R339 Retention of urine, unspecified: Secondary | ICD-10-CM | POA: Diagnosis not present

## 2019-08-01 DIAGNOSIS — Z791 Long term (current) use of non-steroidal anti-inflammatories (NSAID): Secondary | ICD-10-CM | POA: Diagnosis not present

## 2019-08-01 DIAGNOSIS — G8250 Quadriplegia, unspecified: Secondary | ICD-10-CM | POA: Diagnosis not present

## 2019-08-01 DIAGNOSIS — Z792 Long term (current) use of antibiotics: Secondary | ICD-10-CM | POA: Diagnosis not present

## 2019-08-01 DIAGNOSIS — Z993 Dependence on wheelchair: Secondary | ICD-10-CM | POA: Diagnosis not present

## 2019-08-01 DIAGNOSIS — G834 Cauda equina syndrome: Secondary | ICD-10-CM | POA: Diagnosis not present

## 2019-08-01 DIAGNOSIS — Z466 Encounter for fitting and adjustment of urinary device: Secondary | ICD-10-CM | POA: Diagnosis not present

## 2019-08-03 DIAGNOSIS — G8250 Quadriplegia, unspecified: Secondary | ICD-10-CM | POA: Diagnosis not present

## 2019-08-03 DIAGNOSIS — Z993 Dependence on wheelchair: Secondary | ICD-10-CM | POA: Diagnosis not present

## 2019-08-03 DIAGNOSIS — Z466 Encounter for fitting and adjustment of urinary device: Secondary | ICD-10-CM | POA: Diagnosis not present

## 2019-08-03 DIAGNOSIS — Z792 Long term (current) use of antibiotics: Secondary | ICD-10-CM | POA: Diagnosis not present

## 2019-08-03 DIAGNOSIS — Z791 Long term (current) use of non-steroidal anti-inflammatories (NSAID): Secondary | ICD-10-CM | POA: Diagnosis not present

## 2019-08-03 DIAGNOSIS — G834 Cauda equina syndrome: Secondary | ICD-10-CM | POA: Diagnosis not present

## 2019-08-07 DIAGNOSIS — G8250 Quadriplegia, unspecified: Secondary | ICD-10-CM | POA: Diagnosis not present

## 2019-08-07 DIAGNOSIS — Z993 Dependence on wheelchair: Secondary | ICD-10-CM | POA: Diagnosis not present

## 2019-08-07 DIAGNOSIS — Z792 Long term (current) use of antibiotics: Secondary | ICD-10-CM | POA: Diagnosis not present

## 2019-08-07 DIAGNOSIS — G834 Cauda equina syndrome: Secondary | ICD-10-CM | POA: Diagnosis not present

## 2019-08-07 DIAGNOSIS — Z466 Encounter for fitting and adjustment of urinary device: Secondary | ICD-10-CM | POA: Diagnosis not present

## 2019-08-07 DIAGNOSIS — Z791 Long term (current) use of non-steroidal anti-inflammatories (NSAID): Secondary | ICD-10-CM | POA: Diagnosis not present

## 2019-08-08 ENCOUNTER — Ambulatory Visit: Payer: Medicare Other | Admitting: Internal Medicine

## 2019-08-08 DIAGNOSIS — Z466 Encounter for fitting and adjustment of urinary device: Secondary | ICD-10-CM | POA: Diagnosis not present

## 2019-08-08 DIAGNOSIS — Z791 Long term (current) use of non-steroidal anti-inflammatories (NSAID): Secondary | ICD-10-CM | POA: Diagnosis not present

## 2019-08-08 DIAGNOSIS — Z792 Long term (current) use of antibiotics: Secondary | ICD-10-CM | POA: Diagnosis not present

## 2019-08-08 DIAGNOSIS — Z993 Dependence on wheelchair: Secondary | ICD-10-CM | POA: Diagnosis not present

## 2019-08-08 DIAGNOSIS — G8250 Quadriplegia, unspecified: Secondary | ICD-10-CM | POA: Diagnosis not present

## 2019-08-08 DIAGNOSIS — G834 Cauda equina syndrome: Secondary | ICD-10-CM | POA: Diagnosis not present

## 2019-08-10 DIAGNOSIS — G8250 Quadriplegia, unspecified: Secondary | ICD-10-CM | POA: Diagnosis not present

## 2019-08-10 DIAGNOSIS — Z993 Dependence on wheelchair: Secondary | ICD-10-CM | POA: Diagnosis not present

## 2019-08-10 DIAGNOSIS — Z466 Encounter for fitting and adjustment of urinary device: Secondary | ICD-10-CM | POA: Diagnosis not present

## 2019-08-10 DIAGNOSIS — Z792 Long term (current) use of antibiotics: Secondary | ICD-10-CM | POA: Diagnosis not present

## 2019-08-10 DIAGNOSIS — Z791 Long term (current) use of non-steroidal anti-inflammatories (NSAID): Secondary | ICD-10-CM | POA: Diagnosis not present

## 2019-08-10 DIAGNOSIS — G834 Cauda equina syndrome: Secondary | ICD-10-CM | POA: Diagnosis not present

## 2019-08-13 DIAGNOSIS — Z993 Dependence on wheelchair: Secondary | ICD-10-CM | POA: Diagnosis not present

## 2019-08-13 DIAGNOSIS — Z466 Encounter for fitting and adjustment of urinary device: Secondary | ICD-10-CM | POA: Diagnosis not present

## 2019-08-13 DIAGNOSIS — Z791 Long term (current) use of non-steroidal anti-inflammatories (NSAID): Secondary | ICD-10-CM | POA: Diagnosis not present

## 2019-08-13 DIAGNOSIS — G834 Cauda equina syndrome: Secondary | ICD-10-CM | POA: Diagnosis not present

## 2019-08-13 DIAGNOSIS — G8250 Quadriplegia, unspecified: Secondary | ICD-10-CM | POA: Diagnosis not present

## 2019-08-13 DIAGNOSIS — Z792 Long term (current) use of antibiotics: Secondary | ICD-10-CM | POA: Diagnosis not present

## 2019-08-14 ENCOUNTER — Other Ambulatory Visit: Payer: Self-pay

## 2019-08-14 MED ORDER — BACLOFEN 10 MG PO TABS: 10.0000 mg | ORAL_TABLET | Freq: Two times a day (BID) | ORAL | 2 refills | Status: DC

## 2019-08-14 MED ORDER — FLUCONAZOLE 150 MG PO TABS: 150.0000 mg | ORAL_TABLET | Freq: Every day | ORAL | 3 refills | Status: DC

## 2019-08-15 DIAGNOSIS — Z466 Encounter for fitting and adjustment of urinary device: Secondary | ICD-10-CM | POA: Diagnosis not present

## 2019-08-15 DIAGNOSIS — Z792 Long term (current) use of antibiotics: Secondary | ICD-10-CM | POA: Diagnosis not present

## 2019-08-15 DIAGNOSIS — Z791 Long term (current) use of non-steroidal anti-inflammatories (NSAID): Secondary | ICD-10-CM | POA: Diagnosis not present

## 2019-08-15 DIAGNOSIS — Z993 Dependence on wheelchair: Secondary | ICD-10-CM | POA: Diagnosis not present

## 2019-08-15 DIAGNOSIS — G8250 Quadriplegia, unspecified: Secondary | ICD-10-CM | POA: Diagnosis not present

## 2019-08-15 DIAGNOSIS — G834 Cauda equina syndrome: Secondary | ICD-10-CM | POA: Diagnosis not present

## 2019-08-17 DIAGNOSIS — Z792 Long term (current) use of antibiotics: Secondary | ICD-10-CM | POA: Diagnosis not present

## 2019-08-17 DIAGNOSIS — Z993 Dependence on wheelchair: Secondary | ICD-10-CM | POA: Diagnosis not present

## 2019-08-17 DIAGNOSIS — G8250 Quadriplegia, unspecified: Secondary | ICD-10-CM | POA: Diagnosis not present

## 2019-08-17 DIAGNOSIS — G834 Cauda equina syndrome: Secondary | ICD-10-CM | POA: Diagnosis not present

## 2019-08-17 DIAGNOSIS — Z466 Encounter for fitting and adjustment of urinary device: Secondary | ICD-10-CM | POA: Diagnosis not present

## 2019-08-17 DIAGNOSIS — Z791 Long term (current) use of non-steroidal anti-inflammatories (NSAID): Secondary | ICD-10-CM | POA: Diagnosis not present

## 2019-08-20 DIAGNOSIS — Z792 Long term (current) use of antibiotics: Secondary | ICD-10-CM | POA: Diagnosis not present

## 2019-08-20 DIAGNOSIS — Z791 Long term (current) use of non-steroidal anti-inflammatories (NSAID): Secondary | ICD-10-CM | POA: Diagnosis not present

## 2019-08-20 DIAGNOSIS — G834 Cauda equina syndrome: Secondary | ICD-10-CM | POA: Diagnosis not present

## 2019-08-20 DIAGNOSIS — Z993 Dependence on wheelchair: Secondary | ICD-10-CM | POA: Diagnosis not present

## 2019-08-20 DIAGNOSIS — G8250 Quadriplegia, unspecified: Secondary | ICD-10-CM | POA: Diagnosis not present

## 2019-08-20 DIAGNOSIS — Z466 Encounter for fitting and adjustment of urinary device: Secondary | ICD-10-CM | POA: Diagnosis not present

## 2019-08-22 ENCOUNTER — Ambulatory Visit: Payer: Medicare Other | Admitting: Internal Medicine

## 2019-08-22 DIAGNOSIS — Z791 Long term (current) use of non-steroidal anti-inflammatories (NSAID): Secondary | ICD-10-CM | POA: Diagnosis not present

## 2019-08-22 DIAGNOSIS — Z993 Dependence on wheelchair: Secondary | ICD-10-CM | POA: Diagnosis not present

## 2019-08-22 DIAGNOSIS — G8250 Quadriplegia, unspecified: Secondary | ICD-10-CM | POA: Diagnosis not present

## 2019-08-22 DIAGNOSIS — G834 Cauda equina syndrome: Secondary | ICD-10-CM | POA: Diagnosis not present

## 2019-08-22 DIAGNOSIS — Z466 Encounter for fitting and adjustment of urinary device: Secondary | ICD-10-CM | POA: Diagnosis not present

## 2019-08-22 DIAGNOSIS — Z792 Long term (current) use of antibiotics: Secondary | ICD-10-CM | POA: Diagnosis not present

## 2019-08-23 DIAGNOSIS — Z792 Long term (current) use of antibiotics: Secondary | ICD-10-CM | POA: Diagnosis not present

## 2019-08-23 DIAGNOSIS — G834 Cauda equina syndrome: Secondary | ICD-10-CM | POA: Diagnosis not present

## 2019-08-23 DIAGNOSIS — G8250 Quadriplegia, unspecified: Secondary | ICD-10-CM | POA: Diagnosis not present

## 2019-08-23 DIAGNOSIS — Z791 Long term (current) use of non-steroidal anti-inflammatories (NSAID): Secondary | ICD-10-CM | POA: Diagnosis not present

## 2019-08-23 DIAGNOSIS — Z466 Encounter for fitting and adjustment of urinary device: Secondary | ICD-10-CM | POA: Diagnosis not present

## 2019-08-23 DIAGNOSIS — Z993 Dependence on wheelchair: Secondary | ICD-10-CM | POA: Diagnosis not present

## 2019-08-24 ENCOUNTER — Encounter: Payer: Self-pay | Admitting: Internal Medicine

## 2019-08-24 ENCOUNTER — Other Ambulatory Visit: Payer: Self-pay

## 2019-08-24 ENCOUNTER — Ambulatory Visit (INDEPENDENT_AMBULATORY_CARE_PROVIDER_SITE_OTHER): Payer: Medicare Other | Admitting: Internal Medicine

## 2019-08-24 VITALS — BP 109/64 | HR 87

## 2019-08-24 DIAGNOSIS — Z792 Long term (current) use of antibiotics: Secondary | ICD-10-CM | POA: Diagnosis not present

## 2019-08-24 DIAGNOSIS — Z466 Encounter for fitting and adjustment of urinary device: Secondary | ICD-10-CM | POA: Diagnosis not present

## 2019-08-24 DIAGNOSIS — Z791 Long term (current) use of non-steroidal anti-inflammatories (NSAID): Secondary | ICD-10-CM | POA: Diagnosis not present

## 2019-08-24 DIAGNOSIS — G834 Cauda equina syndrome: Secondary | ICD-10-CM

## 2019-08-24 DIAGNOSIS — Z993 Dependence on wheelchair: Secondary | ICD-10-CM | POA: Insufficient documentation

## 2019-08-24 DIAGNOSIS — R32 Unspecified urinary incontinence: Secondary | ICD-10-CM | POA: Diagnosis not present

## 2019-08-24 DIAGNOSIS — G8250 Quadriplegia, unspecified: Secondary | ICD-10-CM

## 2019-08-24 NOTE — Assessment & Plan Note (Signed)
Patient uses baclofen for spasmodic pain

## 2019-08-24 NOTE — Assessment & Plan Note (Signed)
Patient is dependent on wheelchair which is in good shape.

## 2019-08-24 NOTE — Assessment & Plan Note (Signed)
Chronic chronic problem.  She has to wear the Foley catheter because of incontinence
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
## 2019-08-24 NOTE — Assessment & Plan Note (Signed)
Patient is also.  And has an indwelling Foley catheter.  The catheter has to be changed every 3 months.  Patient is able to drive her wheelchair.

## 2019-08-27 DIAGNOSIS — Z792 Long term (current) use of antibiotics: Secondary | ICD-10-CM | POA: Diagnosis not present

## 2019-08-27 DIAGNOSIS — Z791 Long term (current) use of non-steroidal anti-inflammatories (NSAID): Secondary | ICD-10-CM | POA: Diagnosis not present

## 2019-08-27 DIAGNOSIS — G834 Cauda equina syndrome: Secondary | ICD-10-CM | POA: Diagnosis not present

## 2019-08-27 DIAGNOSIS — Z466 Encounter for fitting and adjustment of urinary device: Secondary | ICD-10-CM | POA: Diagnosis not present

## 2019-08-27 DIAGNOSIS — Z993 Dependence on wheelchair: Secondary | ICD-10-CM | POA: Diagnosis not present

## 2019-08-27 DIAGNOSIS — G8250 Quadriplegia, unspecified: Secondary | ICD-10-CM | POA: Diagnosis not present

## 2019-08-28 ENCOUNTER — Other Ambulatory Visit: Payer: Self-pay | Admitting: Internal Medicine

## 2019-08-28 DIAGNOSIS — G8250 Quadriplegia, unspecified: Secondary | ICD-10-CM | POA: Diagnosis not present

## 2019-08-28 DIAGNOSIS — Z993 Dependence on wheelchair: Secondary | ICD-10-CM | POA: Diagnosis not present

## 2019-08-28 DIAGNOSIS — Z466 Encounter for fitting and adjustment of urinary device: Secondary | ICD-10-CM | POA: Diagnosis not present

## 2019-08-28 DIAGNOSIS — G834 Cauda equina syndrome: Secondary | ICD-10-CM | POA: Diagnosis not present

## 2019-08-28 DIAGNOSIS — Z792 Long term (current) use of antibiotics: Secondary | ICD-10-CM | POA: Diagnosis not present

## 2019-08-28 DIAGNOSIS — Z1231 Encounter for screening mammogram for malignant neoplasm of breast: Secondary | ICD-10-CM

## 2019-08-28 DIAGNOSIS — Z791 Long term (current) use of non-steroidal anti-inflammatories (NSAID): Secondary | ICD-10-CM | POA: Diagnosis not present

## 2019-08-29 DIAGNOSIS — G8250 Quadriplegia, unspecified: Secondary | ICD-10-CM | POA: Diagnosis not present

## 2019-08-29 DIAGNOSIS — G834 Cauda equina syndrome: Secondary | ICD-10-CM | POA: Diagnosis not present

## 2019-08-29 DIAGNOSIS — Z466 Encounter for fitting and adjustment of urinary device: Secondary | ICD-10-CM | POA: Diagnosis not present

## 2019-08-29 DIAGNOSIS — Z993 Dependence on wheelchair: Secondary | ICD-10-CM | POA: Diagnosis not present

## 2019-08-29 DIAGNOSIS — Z791 Long term (current) use of non-steroidal anti-inflammatories (NSAID): Secondary | ICD-10-CM | POA: Diagnosis not present

## 2019-08-29 DIAGNOSIS — Z792 Long term (current) use of antibiotics: Secondary | ICD-10-CM | POA: Diagnosis not present

## 2019-08-30 DIAGNOSIS — R339 Retention of urine, unspecified: Secondary | ICD-10-CM | POA: Diagnosis not present

## 2019-08-30 DIAGNOSIS — Z87440 Personal history of urinary (tract) infections: Secondary | ICD-10-CM | POA: Diagnosis not present

## 2019-08-30 DIAGNOSIS — G8250 Quadriplegia, unspecified: Secondary | ICD-10-CM | POA: Diagnosis not present

## 2019-08-31 DIAGNOSIS — Z87440 Personal history of urinary (tract) infections: Secondary | ICD-10-CM | POA: Diagnosis not present

## 2019-08-31 DIAGNOSIS — G8250 Quadriplegia, unspecified: Secondary | ICD-10-CM | POA: Diagnosis not present

## 2019-08-31 DIAGNOSIS — R339 Retention of urine, unspecified: Secondary | ICD-10-CM | POA: Diagnosis not present

## 2019-08-31 DIAGNOSIS — Z791 Long term (current) use of non-steroidal anti-inflammatories (NSAID): Secondary | ICD-10-CM | POA: Diagnosis not present

## 2019-08-31 DIAGNOSIS — Z466 Encounter for fitting and adjustment of urinary device: Secondary | ICD-10-CM | POA: Diagnosis not present

## 2019-08-31 DIAGNOSIS — G834 Cauda equina syndrome: Secondary | ICD-10-CM | POA: Diagnosis not present

## 2019-08-31 DIAGNOSIS — Z792 Long term (current) use of antibiotics: Secondary | ICD-10-CM | POA: Diagnosis not present

## 2019-08-31 DIAGNOSIS — Z993 Dependence on wheelchair: Secondary | ICD-10-CM | POA: Diagnosis not present

## 2019-09-03 DIAGNOSIS — G834 Cauda equina syndrome: Secondary | ICD-10-CM | POA: Diagnosis not present

## 2019-09-03 DIAGNOSIS — Z466 Encounter for fitting and adjustment of urinary device: Secondary | ICD-10-CM | POA: Diagnosis not present

## 2019-09-03 DIAGNOSIS — Z792 Long term (current) use of antibiotics: Secondary | ICD-10-CM | POA: Diagnosis not present

## 2019-09-03 DIAGNOSIS — G8250 Quadriplegia, unspecified: Secondary | ICD-10-CM | POA: Diagnosis not present

## 2019-09-03 DIAGNOSIS — Z791 Long term (current) use of non-steroidal anti-inflammatories (NSAID): Secondary | ICD-10-CM | POA: Diagnosis not present

## 2019-09-03 DIAGNOSIS — Z993 Dependence on wheelchair: Secondary | ICD-10-CM | POA: Diagnosis not present

## 2019-09-05 ENCOUNTER — Ambulatory Visit
Admission: RE | Admit: 2019-09-05 | Discharge: 2019-09-05 | Disposition: A | Discharge status: Home / Self Care | Payer: Medicare Other | Source: Ambulatory Visit | Attending: Internal Medicine | Admitting: Internal Medicine

## 2019-09-05 DIAGNOSIS — Z791 Long term (current) use of non-steroidal anti-inflammatories (NSAID): Secondary | ICD-10-CM | POA: Diagnosis not present

## 2019-09-05 DIAGNOSIS — G834 Cauda equina syndrome: Secondary | ICD-10-CM | POA: Diagnosis not present

## 2019-09-05 DIAGNOSIS — Z993 Dependence on wheelchair: Secondary | ICD-10-CM | POA: Diagnosis not present

## 2019-09-05 DIAGNOSIS — Z466 Encounter for fitting and adjustment of urinary device: Secondary | ICD-10-CM | POA: Diagnosis not present

## 2019-09-05 DIAGNOSIS — B351 Tinea unguium: Secondary | ICD-10-CM | POA: Diagnosis not present

## 2019-09-05 DIAGNOSIS — I739 Peripheral vascular disease, unspecified: Secondary | ICD-10-CM | POA: Diagnosis not present

## 2019-09-05 DIAGNOSIS — G8250 Quadriplegia, unspecified: Secondary | ICD-10-CM | POA: Diagnosis not present

## 2019-09-05 DIAGNOSIS — Z792 Long term (current) use of antibiotics: Secondary | ICD-10-CM | POA: Diagnosis not present

## 2019-09-05 DIAGNOSIS — Z1231 Encounter for screening mammogram for malignant neoplasm of breast: Secondary | ICD-10-CM | POA: Insufficient documentation

## 2019-09-07 DIAGNOSIS — Z466 Encounter for fitting and adjustment of urinary device: Secondary | ICD-10-CM | POA: Diagnosis not present

## 2019-09-07 DIAGNOSIS — G8250 Quadriplegia, unspecified: Secondary | ICD-10-CM | POA: Diagnosis not present

## 2019-09-07 DIAGNOSIS — Z792 Long term (current) use of antibiotics: Secondary | ICD-10-CM | POA: Diagnosis not present

## 2019-09-07 DIAGNOSIS — Z791 Long term (current) use of non-steroidal anti-inflammatories (NSAID): Secondary | ICD-10-CM | POA: Diagnosis not present

## 2019-09-07 DIAGNOSIS — G834 Cauda equina syndrome: Secondary | ICD-10-CM | POA: Diagnosis not present

## 2019-09-07 DIAGNOSIS — Z993 Dependence on wheelchair: Secondary | ICD-10-CM | POA: Diagnosis not present

## 2019-09-11 DIAGNOSIS — Z466 Encounter for fitting and adjustment of urinary device: Secondary | ICD-10-CM | POA: Diagnosis not present

## 2019-09-11 DIAGNOSIS — G834 Cauda equina syndrome: Secondary | ICD-10-CM | POA: Diagnosis not present

## 2019-09-11 DIAGNOSIS — Z791 Long term (current) use of non-steroidal anti-inflammatories (NSAID): Secondary | ICD-10-CM | POA: Diagnosis not present

## 2019-09-11 DIAGNOSIS — Z993 Dependence on wheelchair: Secondary | ICD-10-CM | POA: Diagnosis not present

## 2019-09-11 DIAGNOSIS — G8250 Quadriplegia, unspecified: Secondary | ICD-10-CM | POA: Diagnosis not present

## 2019-09-11 DIAGNOSIS — Z792 Long term (current) use of antibiotics: Secondary | ICD-10-CM | POA: Diagnosis not present

## 2019-09-12 DIAGNOSIS — Z791 Long term (current) use of non-steroidal anti-inflammatories (NSAID): Secondary | ICD-10-CM | POA: Diagnosis not present

## 2019-09-12 DIAGNOSIS — Z792 Long term (current) use of antibiotics: Secondary | ICD-10-CM | POA: Diagnosis not present

## 2019-09-12 DIAGNOSIS — Z993 Dependence on wheelchair: Secondary | ICD-10-CM | POA: Diagnosis not present

## 2019-09-12 DIAGNOSIS — Z466 Encounter for fitting and adjustment of urinary device: Secondary | ICD-10-CM | POA: Diagnosis not present

## 2019-09-12 DIAGNOSIS — G834 Cauda equina syndrome: Secondary | ICD-10-CM | POA: Diagnosis not present

## 2019-09-12 DIAGNOSIS — G8250 Quadriplegia, unspecified: Secondary | ICD-10-CM | POA: Diagnosis not present

## 2019-09-14 DIAGNOSIS — Z993 Dependence on wheelchair: Secondary | ICD-10-CM | POA: Diagnosis not present

## 2019-09-14 DIAGNOSIS — G834 Cauda equina syndrome: Secondary | ICD-10-CM | POA: Diagnosis not present

## 2019-09-14 DIAGNOSIS — Z792 Long term (current) use of antibiotics: Secondary | ICD-10-CM | POA: Diagnosis not present

## 2019-09-14 DIAGNOSIS — G8250 Quadriplegia, unspecified: Secondary | ICD-10-CM | POA: Diagnosis not present

## 2019-09-14 DIAGNOSIS — Z466 Encounter for fitting and adjustment of urinary device: Secondary | ICD-10-CM | POA: Diagnosis not present

## 2019-09-14 DIAGNOSIS — Z791 Long term (current) use of non-steroidal anti-inflammatories (NSAID): Secondary | ICD-10-CM | POA: Diagnosis not present

## 2019-09-17 DIAGNOSIS — G8250 Quadriplegia, unspecified: Secondary | ICD-10-CM | POA: Diagnosis not present

## 2019-09-17 DIAGNOSIS — G834 Cauda equina syndrome: Secondary | ICD-10-CM | POA: Diagnosis not present

## 2019-09-17 DIAGNOSIS — Z792 Long term (current) use of antibiotics: Secondary | ICD-10-CM | POA: Diagnosis not present

## 2019-09-17 DIAGNOSIS — Z993 Dependence on wheelchair: Secondary | ICD-10-CM | POA: Diagnosis not present

## 2019-09-17 DIAGNOSIS — Z791 Long term (current) use of non-steroidal anti-inflammatories (NSAID): Secondary | ICD-10-CM | POA: Diagnosis not present

## 2019-09-17 DIAGNOSIS — Z466 Encounter for fitting and adjustment of urinary device: Secondary | ICD-10-CM | POA: Diagnosis not present

## 2019-09-18 DIAGNOSIS — Z792 Long term (current) use of antibiotics: Secondary | ICD-10-CM | POA: Diagnosis not present

## 2019-09-18 DIAGNOSIS — Z791 Long term (current) use of non-steroidal anti-inflammatories (NSAID): Secondary | ICD-10-CM | POA: Diagnosis not present

## 2019-09-18 DIAGNOSIS — G8250 Quadriplegia, unspecified: Secondary | ICD-10-CM | POA: Diagnosis not present

## 2019-09-18 DIAGNOSIS — Z466 Encounter for fitting and adjustment of urinary device: Secondary | ICD-10-CM | POA: Diagnosis not present

## 2019-09-18 DIAGNOSIS — G834 Cauda equina syndrome: Secondary | ICD-10-CM | POA: Diagnosis not present

## 2019-09-18 DIAGNOSIS — Z993 Dependence on wheelchair: Secondary | ICD-10-CM | POA: Diagnosis not present

## 2019-09-19 DIAGNOSIS — G834 Cauda equina syndrome: Secondary | ICD-10-CM | POA: Diagnosis not present

## 2019-09-19 DIAGNOSIS — Z792 Long term (current) use of antibiotics: Secondary | ICD-10-CM | POA: Diagnosis not present

## 2019-09-19 DIAGNOSIS — G8250 Quadriplegia, unspecified: Secondary | ICD-10-CM | POA: Diagnosis not present

## 2019-09-19 DIAGNOSIS — Z466 Encounter for fitting and adjustment of urinary device: Secondary | ICD-10-CM | POA: Diagnosis not present

## 2019-09-19 DIAGNOSIS — Z993 Dependence on wheelchair: Secondary | ICD-10-CM | POA: Diagnosis not present

## 2019-09-19 DIAGNOSIS — Z791 Long term (current) use of non-steroidal anti-inflammatories (NSAID): Secondary | ICD-10-CM | POA: Diagnosis not present

## 2019-09-21 DIAGNOSIS — Z466 Encounter for fitting and adjustment of urinary device: Secondary | ICD-10-CM | POA: Diagnosis not present

## 2019-09-21 DIAGNOSIS — Z791 Long term (current) use of non-steroidal anti-inflammatories (NSAID): Secondary | ICD-10-CM | POA: Diagnosis not present

## 2019-09-21 DIAGNOSIS — G834 Cauda equina syndrome: Secondary | ICD-10-CM | POA: Diagnosis not present

## 2019-09-21 DIAGNOSIS — G8250 Quadriplegia, unspecified: Secondary | ICD-10-CM | POA: Diagnosis not present

## 2019-09-21 DIAGNOSIS — Z993 Dependence on wheelchair: Secondary | ICD-10-CM | POA: Diagnosis not present

## 2019-09-21 DIAGNOSIS — Z792 Long term (current) use of antibiotics: Secondary | ICD-10-CM | POA: Diagnosis not present

## 2019-09-24 DIAGNOSIS — Z993 Dependence on wheelchair: Secondary | ICD-10-CM | POA: Diagnosis not present

## 2019-09-24 DIAGNOSIS — Z466 Encounter for fitting and adjustment of urinary device: Secondary | ICD-10-CM | POA: Diagnosis not present

## 2019-09-24 DIAGNOSIS — G8250 Quadriplegia, unspecified: Secondary | ICD-10-CM | POA: Diagnosis not present

## 2019-09-24 DIAGNOSIS — G834 Cauda equina syndrome: Secondary | ICD-10-CM | POA: Diagnosis not present

## 2019-09-24 DIAGNOSIS — Z792 Long term (current) use of antibiotics: Secondary | ICD-10-CM | POA: Diagnosis not present

## 2019-09-24 DIAGNOSIS — Z791 Long term (current) use of non-steroidal anti-inflammatories (NSAID): Secondary | ICD-10-CM | POA: Diagnosis not present

## 2019-09-26 DIAGNOSIS — Z792 Long term (current) use of antibiotics: Secondary | ICD-10-CM | POA: Diagnosis not present

## 2019-09-26 DIAGNOSIS — Z993 Dependence on wheelchair: Secondary | ICD-10-CM | POA: Diagnosis not present

## 2019-09-26 DIAGNOSIS — Z791 Long term (current) use of non-steroidal anti-inflammatories (NSAID): Secondary | ICD-10-CM | POA: Diagnosis not present

## 2019-09-26 DIAGNOSIS — G8250 Quadriplegia, unspecified: Secondary | ICD-10-CM | POA: Diagnosis not present

## 2019-09-26 DIAGNOSIS — G834 Cauda equina syndrome: Secondary | ICD-10-CM | POA: Diagnosis not present

## 2019-09-26 DIAGNOSIS — Z466 Encounter for fitting and adjustment of urinary device: Secondary | ICD-10-CM | POA: Diagnosis not present

## 2019-09-28 DIAGNOSIS — Z792 Long term (current) use of antibiotics: Secondary | ICD-10-CM | POA: Diagnosis not present

## 2019-09-28 DIAGNOSIS — Z993 Dependence on wheelchair: Secondary | ICD-10-CM | POA: Diagnosis not present

## 2019-09-28 DIAGNOSIS — Z466 Encounter for fitting and adjustment of urinary device: Secondary | ICD-10-CM | POA: Diagnosis not present

## 2019-09-28 DIAGNOSIS — G8250 Quadriplegia, unspecified: Secondary | ICD-10-CM | POA: Diagnosis not present

## 2019-09-28 DIAGNOSIS — Z791 Long term (current) use of non-steroidal anti-inflammatories (NSAID): Secondary | ICD-10-CM | POA: Diagnosis not present

## 2019-09-28 DIAGNOSIS — G834 Cauda equina syndrome: Secondary | ICD-10-CM | POA: Diagnosis not present

## 2019-10-01 DIAGNOSIS — Z993 Dependence on wheelchair: Secondary | ICD-10-CM | POA: Diagnosis not present

## 2019-10-01 DIAGNOSIS — Z87440 Personal history of urinary (tract) infections: Secondary | ICD-10-CM | POA: Diagnosis not present

## 2019-10-01 DIAGNOSIS — G834 Cauda equina syndrome: Secondary | ICD-10-CM | POA: Diagnosis not present

## 2019-10-01 DIAGNOSIS — Z466 Encounter for fitting and adjustment of urinary device: Secondary | ICD-10-CM | POA: Diagnosis not present

## 2019-10-01 DIAGNOSIS — Z791 Long term (current) use of non-steroidal anti-inflammatories (NSAID): Secondary | ICD-10-CM | POA: Diagnosis not present

## 2019-10-01 DIAGNOSIS — G8250 Quadriplegia, unspecified: Secondary | ICD-10-CM | POA: Diagnosis not present

## 2019-10-01 DIAGNOSIS — Z792 Long term (current) use of antibiotics: Secondary | ICD-10-CM | POA: Diagnosis not present

## 2019-10-01 DIAGNOSIS — R339 Retention of urine, unspecified: Secondary | ICD-10-CM | POA: Diagnosis not present

## 2019-10-03 DIAGNOSIS — G8250 Quadriplegia, unspecified: Secondary | ICD-10-CM | POA: Diagnosis not present

## 2019-10-03 DIAGNOSIS — G834 Cauda equina syndrome: Secondary | ICD-10-CM | POA: Diagnosis not present

## 2019-10-03 DIAGNOSIS — Z791 Long term (current) use of non-steroidal anti-inflammatories (NSAID): Secondary | ICD-10-CM | POA: Diagnosis not present

## 2019-10-03 DIAGNOSIS — Z466 Encounter for fitting and adjustment of urinary device: Secondary | ICD-10-CM | POA: Diagnosis not present

## 2019-10-03 DIAGNOSIS — Z993 Dependence on wheelchair: Secondary | ICD-10-CM | POA: Diagnosis not present

## 2019-10-03 DIAGNOSIS — Z792 Long term (current) use of antibiotics: Secondary | ICD-10-CM | POA: Diagnosis not present

## 2019-10-05 DIAGNOSIS — Z993 Dependence on wheelchair: Secondary | ICD-10-CM | POA: Diagnosis not present

## 2019-10-05 DIAGNOSIS — Z792 Long term (current) use of antibiotics: Secondary | ICD-10-CM | POA: Diagnosis not present

## 2019-10-05 DIAGNOSIS — Z791 Long term (current) use of non-steroidal anti-inflammatories (NSAID): Secondary | ICD-10-CM | POA: Diagnosis not present

## 2019-10-05 DIAGNOSIS — Z466 Encounter for fitting and adjustment of urinary device: Secondary | ICD-10-CM | POA: Diagnosis not present

## 2019-10-05 DIAGNOSIS — G8250 Quadriplegia, unspecified: Secondary | ICD-10-CM | POA: Diagnosis not present

## 2019-10-05 DIAGNOSIS — G834 Cauda equina syndrome: Secondary | ICD-10-CM | POA: Diagnosis not present

## 2019-10-08 DIAGNOSIS — Z792 Long term (current) use of antibiotics: Secondary | ICD-10-CM | POA: Diagnosis not present

## 2019-10-08 DIAGNOSIS — Z993 Dependence on wheelchair: Secondary | ICD-10-CM | POA: Diagnosis not present

## 2019-10-08 DIAGNOSIS — G8250 Quadriplegia, unspecified: Secondary | ICD-10-CM | POA: Diagnosis not present

## 2019-10-08 DIAGNOSIS — Z791 Long term (current) use of non-steroidal anti-inflammatories (NSAID): Secondary | ICD-10-CM | POA: Diagnosis not present

## 2019-10-08 DIAGNOSIS — Z466 Encounter for fitting and adjustment of urinary device: Secondary | ICD-10-CM | POA: Diagnosis not present

## 2019-10-08 DIAGNOSIS — G834 Cauda equina syndrome: Secondary | ICD-10-CM | POA: Diagnosis not present

## 2019-10-09 DIAGNOSIS — G834 Cauda equina syndrome: Secondary | ICD-10-CM | POA: Diagnosis not present

## 2019-10-09 DIAGNOSIS — Z792 Long term (current) use of antibiotics: Secondary | ICD-10-CM | POA: Diagnosis not present

## 2019-10-09 DIAGNOSIS — G8250 Quadriplegia, unspecified: Secondary | ICD-10-CM | POA: Diagnosis not present

## 2019-10-09 DIAGNOSIS — Z993 Dependence on wheelchair: Secondary | ICD-10-CM | POA: Diagnosis not present

## 2019-10-09 DIAGNOSIS — Z466 Encounter for fitting and adjustment of urinary device: Secondary | ICD-10-CM | POA: Diagnosis not present

## 2019-10-09 DIAGNOSIS — Z791 Long term (current) use of non-steroidal anti-inflammatories (NSAID): Secondary | ICD-10-CM | POA: Diagnosis not present

## 2019-10-10 DIAGNOSIS — Z791 Long term (current) use of non-steroidal anti-inflammatories (NSAID): Secondary | ICD-10-CM | POA: Diagnosis not present

## 2019-10-10 DIAGNOSIS — G834 Cauda equina syndrome: Secondary | ICD-10-CM | POA: Diagnosis not present

## 2019-10-10 DIAGNOSIS — Z792 Long term (current) use of antibiotics: Secondary | ICD-10-CM | POA: Diagnosis not present

## 2019-10-10 DIAGNOSIS — Z466 Encounter for fitting and adjustment of urinary device: Secondary | ICD-10-CM | POA: Diagnosis not present

## 2019-10-10 DIAGNOSIS — G8250 Quadriplegia, unspecified: Secondary | ICD-10-CM | POA: Diagnosis not present

## 2019-10-10 DIAGNOSIS — Z993 Dependence on wheelchair: Secondary | ICD-10-CM | POA: Diagnosis not present

## 2019-10-12 DIAGNOSIS — G8250 Quadriplegia, unspecified: Secondary | ICD-10-CM | POA: Diagnosis not present

## 2019-10-12 DIAGNOSIS — Z993 Dependence on wheelchair: Secondary | ICD-10-CM | POA: Diagnosis not present

## 2019-10-12 DIAGNOSIS — G834 Cauda equina syndrome: Secondary | ICD-10-CM | POA: Diagnosis not present

## 2019-10-12 DIAGNOSIS — Z792 Long term (current) use of antibiotics: Secondary | ICD-10-CM | POA: Diagnosis not present

## 2019-10-12 DIAGNOSIS — Z791 Long term (current) use of non-steroidal anti-inflammatories (NSAID): Secondary | ICD-10-CM | POA: Diagnosis not present

## 2019-10-12 DIAGNOSIS — Z466 Encounter for fitting and adjustment of urinary device: Secondary | ICD-10-CM | POA: Diagnosis not present

## 2019-10-16 DIAGNOSIS — Z466 Encounter for fitting and adjustment of urinary device: Secondary | ICD-10-CM | POA: Diagnosis not present

## 2019-10-16 DIAGNOSIS — Z792 Long term (current) use of antibiotics: Secondary | ICD-10-CM | POA: Diagnosis not present

## 2019-10-16 DIAGNOSIS — Z791 Long term (current) use of non-steroidal anti-inflammatories (NSAID): Secondary | ICD-10-CM | POA: Diagnosis not present

## 2019-10-16 DIAGNOSIS — G8250 Quadriplegia, unspecified: Secondary | ICD-10-CM | POA: Diagnosis not present

## 2019-10-16 DIAGNOSIS — G834 Cauda equina syndrome: Secondary | ICD-10-CM | POA: Diagnosis not present

## 2019-10-16 DIAGNOSIS — Z993 Dependence on wheelchair: Secondary | ICD-10-CM | POA: Diagnosis not present

## 2019-10-17 DIAGNOSIS — Z792 Long term (current) use of antibiotics: Secondary | ICD-10-CM | POA: Diagnosis not present

## 2019-10-17 DIAGNOSIS — G834 Cauda equina syndrome: Secondary | ICD-10-CM | POA: Diagnosis not present

## 2019-10-17 DIAGNOSIS — Z993 Dependence on wheelchair: Secondary | ICD-10-CM | POA: Diagnosis not present

## 2019-10-17 DIAGNOSIS — Z466 Encounter for fitting and adjustment of urinary device: Secondary | ICD-10-CM | POA: Diagnosis not present

## 2019-10-17 DIAGNOSIS — G8250 Quadriplegia, unspecified: Secondary | ICD-10-CM | POA: Diagnosis not present

## 2019-10-17 DIAGNOSIS — Z791 Long term (current) use of non-steroidal anti-inflammatories (NSAID): Secondary | ICD-10-CM | POA: Diagnosis not present

## 2019-10-19 DIAGNOSIS — Z993 Dependence on wheelchair: Secondary | ICD-10-CM | POA: Diagnosis not present

## 2019-10-19 DIAGNOSIS — Z791 Long term (current) use of non-steroidal anti-inflammatories (NSAID): Secondary | ICD-10-CM | POA: Diagnosis not present

## 2019-10-19 DIAGNOSIS — G834 Cauda equina syndrome: Secondary | ICD-10-CM | POA: Diagnosis not present

## 2019-10-19 DIAGNOSIS — Z466 Encounter for fitting and adjustment of urinary device: Secondary | ICD-10-CM | POA: Diagnosis not present

## 2019-10-19 DIAGNOSIS — G8250 Quadriplegia, unspecified: Secondary | ICD-10-CM | POA: Diagnosis not present

## 2019-10-19 DIAGNOSIS — Z792 Long term (current) use of antibiotics: Secondary | ICD-10-CM | POA: Diagnosis not present

## 2019-10-22 DIAGNOSIS — Z792 Long term (current) use of antibiotics: Secondary | ICD-10-CM | POA: Diagnosis not present

## 2019-10-22 DIAGNOSIS — Z791 Long term (current) use of non-steroidal anti-inflammatories (NSAID): Secondary | ICD-10-CM | POA: Diagnosis not present

## 2019-10-22 DIAGNOSIS — Z993 Dependence on wheelchair: Secondary | ICD-10-CM | POA: Diagnosis not present

## 2019-10-22 DIAGNOSIS — G8250 Quadriplegia, unspecified: Secondary | ICD-10-CM | POA: Diagnosis not present

## 2019-10-22 DIAGNOSIS — G834 Cauda equina syndrome: Secondary | ICD-10-CM | POA: Diagnosis not present

## 2019-10-22 DIAGNOSIS — Z466 Encounter for fitting and adjustment of urinary device: Secondary | ICD-10-CM | POA: Diagnosis not present

## 2019-10-24 DIAGNOSIS — G8250 Quadriplegia, unspecified: Secondary | ICD-10-CM | POA: Diagnosis not present

## 2019-10-24 DIAGNOSIS — Z466 Encounter for fitting and adjustment of urinary device: Secondary | ICD-10-CM | POA: Diagnosis not present

## 2019-10-24 DIAGNOSIS — Z993 Dependence on wheelchair: Secondary | ICD-10-CM | POA: Diagnosis not present

## 2019-10-24 DIAGNOSIS — Z791 Long term (current) use of non-steroidal anti-inflammatories (NSAID): Secondary | ICD-10-CM | POA: Diagnosis not present

## 2019-10-24 DIAGNOSIS — Z792 Long term (current) use of antibiotics: Secondary | ICD-10-CM | POA: Diagnosis not present

## 2019-10-24 DIAGNOSIS — G834 Cauda equina syndrome: Secondary | ICD-10-CM | POA: Diagnosis not present

## 2019-10-26 DIAGNOSIS — Z791 Long term (current) use of non-steroidal anti-inflammatories (NSAID): Secondary | ICD-10-CM | POA: Diagnosis not present

## 2019-10-26 DIAGNOSIS — G8250 Quadriplegia, unspecified: Secondary | ICD-10-CM | POA: Diagnosis not present

## 2019-10-26 DIAGNOSIS — Z792 Long term (current) use of antibiotics: Secondary | ICD-10-CM | POA: Diagnosis not present

## 2019-10-26 DIAGNOSIS — G834 Cauda equina syndrome: Secondary | ICD-10-CM | POA: Diagnosis not present

## 2019-10-26 DIAGNOSIS — Z993 Dependence on wheelchair: Secondary | ICD-10-CM | POA: Diagnosis not present

## 2019-10-26 DIAGNOSIS — Z466 Encounter for fitting and adjustment of urinary device: Secondary | ICD-10-CM | POA: Diagnosis not present

## 2019-10-29 DIAGNOSIS — Z792 Long term (current) use of antibiotics: Secondary | ICD-10-CM | POA: Diagnosis not present

## 2019-10-29 DIAGNOSIS — G834 Cauda equina syndrome: Secondary | ICD-10-CM | POA: Diagnosis not present

## 2019-10-29 DIAGNOSIS — Z993 Dependence on wheelchair: Secondary | ICD-10-CM | POA: Diagnosis not present

## 2019-10-29 DIAGNOSIS — G8250 Quadriplegia, unspecified: Secondary | ICD-10-CM | POA: Diagnosis not present

## 2019-10-29 DIAGNOSIS — Z791 Long term (current) use of non-steroidal anti-inflammatories (NSAID): Secondary | ICD-10-CM | POA: Diagnosis not present

## 2019-10-29 DIAGNOSIS — Z466 Encounter for fitting and adjustment of urinary device: Secondary | ICD-10-CM | POA: Diagnosis not present

## 2019-10-30 DIAGNOSIS — G8250 Quadriplegia, unspecified: Secondary | ICD-10-CM | POA: Diagnosis not present

## 2019-10-30 DIAGNOSIS — Z791 Long term (current) use of non-steroidal anti-inflammatories (NSAID): Secondary | ICD-10-CM | POA: Diagnosis not present

## 2019-10-30 DIAGNOSIS — Z993 Dependence on wheelchair: Secondary | ICD-10-CM | POA: Diagnosis not present

## 2019-10-30 DIAGNOSIS — Z792 Long term (current) use of antibiotics: Secondary | ICD-10-CM | POA: Diagnosis not present

## 2019-10-30 DIAGNOSIS — G834 Cauda equina syndrome: Secondary | ICD-10-CM | POA: Diagnosis not present

## 2019-10-30 DIAGNOSIS — Z466 Encounter for fitting and adjustment of urinary device: Secondary | ICD-10-CM | POA: Diagnosis not present

## 2019-10-31 DIAGNOSIS — Z792 Long term (current) use of antibiotics: Secondary | ICD-10-CM | POA: Diagnosis not present

## 2019-10-31 DIAGNOSIS — Z791 Long term (current) use of non-steroidal anti-inflammatories (NSAID): Secondary | ICD-10-CM | POA: Diagnosis not present

## 2019-10-31 DIAGNOSIS — Z466 Encounter for fitting and adjustment of urinary device: Secondary | ICD-10-CM | POA: Diagnosis not present

## 2019-10-31 DIAGNOSIS — G834 Cauda equina syndrome: Secondary | ICD-10-CM | POA: Diagnosis not present

## 2019-10-31 DIAGNOSIS — Z993 Dependence on wheelchair: Secondary | ICD-10-CM | POA: Diagnosis not present

## 2019-10-31 DIAGNOSIS — G8250 Quadriplegia, unspecified: Secondary | ICD-10-CM | POA: Diagnosis not present

## 2019-11-01 DIAGNOSIS — G8250 Quadriplegia, unspecified: Secondary | ICD-10-CM | POA: Diagnosis not present

## 2019-11-01 DIAGNOSIS — R339 Retention of urine, unspecified: Secondary | ICD-10-CM | POA: Diagnosis not present

## 2019-11-01 DIAGNOSIS — Z87440 Personal history of urinary (tract) infections: Secondary | ICD-10-CM | POA: Diagnosis not present

## 2019-11-02 DIAGNOSIS — R339 Retention of urine, unspecified: Secondary | ICD-10-CM | POA: Diagnosis not present

## 2019-11-02 DIAGNOSIS — Z792 Long term (current) use of antibiotics: Secondary | ICD-10-CM | POA: Diagnosis not present

## 2019-11-02 DIAGNOSIS — Z791 Long term (current) use of non-steroidal anti-inflammatories (NSAID): Secondary | ICD-10-CM | POA: Diagnosis not present

## 2019-11-02 DIAGNOSIS — Z87440 Personal history of urinary (tract) infections: Secondary | ICD-10-CM | POA: Diagnosis not present

## 2019-11-02 DIAGNOSIS — G834 Cauda equina syndrome: Secondary | ICD-10-CM | POA: Diagnosis not present

## 2019-11-02 DIAGNOSIS — Z993 Dependence on wheelchair: Secondary | ICD-10-CM | POA: Diagnosis not present

## 2019-11-02 DIAGNOSIS — G8250 Quadriplegia, unspecified: Secondary | ICD-10-CM | POA: Diagnosis not present

## 2019-11-02 DIAGNOSIS — Z466 Encounter for fitting and adjustment of urinary device: Secondary | ICD-10-CM | POA: Diagnosis not present

## 2019-11-05 DIAGNOSIS — G834 Cauda equina syndrome: Secondary | ICD-10-CM | POA: Diagnosis not present

## 2019-11-05 DIAGNOSIS — Z466 Encounter for fitting and adjustment of urinary device: Secondary | ICD-10-CM | POA: Diagnosis not present

## 2019-11-05 DIAGNOSIS — G8250 Quadriplegia, unspecified: Secondary | ICD-10-CM | POA: Diagnosis not present

## 2019-11-05 DIAGNOSIS — Z993 Dependence on wheelchair: Secondary | ICD-10-CM | POA: Diagnosis not present

## 2019-11-05 DIAGNOSIS — Z792 Long term (current) use of antibiotics: Secondary | ICD-10-CM | POA: Diagnosis not present

## 2019-11-05 DIAGNOSIS — Z791 Long term (current) use of non-steroidal anti-inflammatories (NSAID): Secondary | ICD-10-CM | POA: Diagnosis not present

## 2019-11-07 DIAGNOSIS — G8250 Quadriplegia, unspecified: Secondary | ICD-10-CM | POA: Diagnosis not present

## 2019-11-07 DIAGNOSIS — G834 Cauda equina syndrome: Secondary | ICD-10-CM | POA: Diagnosis not present

## 2019-11-07 DIAGNOSIS — Z993 Dependence on wheelchair: Secondary | ICD-10-CM | POA: Diagnosis not present

## 2019-11-07 DIAGNOSIS — Z792 Long term (current) use of antibiotics: Secondary | ICD-10-CM | POA: Diagnosis not present

## 2019-11-07 DIAGNOSIS — Z466 Encounter for fitting and adjustment of urinary device: Secondary | ICD-10-CM | POA: Diagnosis not present

## 2019-11-07 DIAGNOSIS — Z791 Long term (current) use of non-steroidal anti-inflammatories (NSAID): Secondary | ICD-10-CM | POA: Diagnosis not present

## 2019-11-09 DIAGNOSIS — G8250 Quadriplegia, unspecified: Secondary | ICD-10-CM | POA: Diagnosis not present

## 2019-11-09 DIAGNOSIS — Z466 Encounter for fitting and adjustment of urinary device: Secondary | ICD-10-CM | POA: Diagnosis not present

## 2019-11-09 DIAGNOSIS — Z792 Long term (current) use of antibiotics: Secondary | ICD-10-CM | POA: Diagnosis not present

## 2019-11-09 DIAGNOSIS — Z791 Long term (current) use of non-steroidal anti-inflammatories (NSAID): Secondary | ICD-10-CM | POA: Diagnosis not present

## 2019-11-09 DIAGNOSIS — Z993 Dependence on wheelchair: Secondary | ICD-10-CM | POA: Diagnosis not present

## 2019-11-09 DIAGNOSIS — G834 Cauda equina syndrome: Secondary | ICD-10-CM | POA: Diagnosis not present

## 2019-11-13 ENCOUNTER — Other Ambulatory Visit: Payer: Self-pay | Admitting: *Deleted

## 2019-11-13 DIAGNOSIS — Z791 Long term (current) use of non-steroidal anti-inflammatories (NSAID): Secondary | ICD-10-CM | POA: Diagnosis not present

## 2019-11-13 DIAGNOSIS — G834 Cauda equina syndrome: Secondary | ICD-10-CM | POA: Diagnosis not present

## 2019-11-13 DIAGNOSIS — Z466 Encounter for fitting and adjustment of urinary device: Secondary | ICD-10-CM | POA: Diagnosis not present

## 2019-11-13 DIAGNOSIS — Z993 Dependence on wheelchair: Secondary | ICD-10-CM | POA: Diagnosis not present

## 2019-11-13 DIAGNOSIS — G8250 Quadriplegia, unspecified: Secondary | ICD-10-CM | POA: Diagnosis not present

## 2019-11-13 DIAGNOSIS — Z792 Long term (current) use of antibiotics: Secondary | ICD-10-CM | POA: Diagnosis not present

## 2019-11-13 MED ORDER — OXYBUTYNIN CHLORIDE ER 15 MG PO TB24: 15.0000 mg | ORAL_TABLET | Freq: Every day | ORAL | 3 refills | Status: DC

## 2019-11-14 DIAGNOSIS — G834 Cauda equina syndrome: Secondary | ICD-10-CM | POA: Diagnosis not present

## 2019-11-14 DIAGNOSIS — G8250 Quadriplegia, unspecified: Secondary | ICD-10-CM | POA: Diagnosis not present

## 2019-11-14 DIAGNOSIS — Z993 Dependence on wheelchair: Secondary | ICD-10-CM | POA: Diagnosis not present

## 2019-11-14 DIAGNOSIS — Z791 Long term (current) use of non-steroidal anti-inflammatories (NSAID): Secondary | ICD-10-CM | POA: Diagnosis not present

## 2019-11-14 DIAGNOSIS — Z466 Encounter for fitting and adjustment of urinary device: Secondary | ICD-10-CM | POA: Diagnosis not present

## 2019-11-14 DIAGNOSIS — Z792 Long term (current) use of antibiotics: Secondary | ICD-10-CM | POA: Diagnosis not present

## 2019-11-16 DIAGNOSIS — G834 Cauda equina syndrome: Secondary | ICD-10-CM | POA: Diagnosis not present

## 2019-11-16 DIAGNOSIS — G8250 Quadriplegia, unspecified: Secondary | ICD-10-CM | POA: Diagnosis not present

## 2019-11-16 DIAGNOSIS — Z791 Long term (current) use of non-steroidal anti-inflammatories (NSAID): Secondary | ICD-10-CM | POA: Diagnosis not present

## 2019-11-16 DIAGNOSIS — Z792 Long term (current) use of antibiotics: Secondary | ICD-10-CM | POA: Diagnosis not present

## 2019-11-16 DIAGNOSIS — Z993 Dependence on wheelchair: Secondary | ICD-10-CM | POA: Diagnosis not present

## 2019-11-16 DIAGNOSIS — Z466 Encounter for fitting and adjustment of urinary device: Secondary | ICD-10-CM | POA: Diagnosis not present

## 2019-11-19 DIAGNOSIS — Z993 Dependence on wheelchair: Secondary | ICD-10-CM | POA: Diagnosis not present

## 2019-11-19 DIAGNOSIS — Z466 Encounter for fitting and adjustment of urinary device: Secondary | ICD-10-CM | POA: Diagnosis not present

## 2019-11-19 DIAGNOSIS — Z791 Long term (current) use of non-steroidal anti-inflammatories (NSAID): Secondary | ICD-10-CM | POA: Diagnosis not present

## 2019-11-19 DIAGNOSIS — G834 Cauda equina syndrome: Secondary | ICD-10-CM | POA: Diagnosis not present

## 2019-11-19 DIAGNOSIS — G8250 Quadriplegia, unspecified: Secondary | ICD-10-CM | POA: Diagnosis not present

## 2019-11-19 DIAGNOSIS — Z792 Long term (current) use of antibiotics: Secondary | ICD-10-CM | POA: Diagnosis not present

## 2019-11-20 DIAGNOSIS — G834 Cauda equina syndrome: Secondary | ICD-10-CM | POA: Diagnosis not present

## 2019-11-20 DIAGNOSIS — Z792 Long term (current) use of antibiotics: Secondary | ICD-10-CM | POA: Diagnosis not present

## 2019-11-20 DIAGNOSIS — Z993 Dependence on wheelchair: Secondary | ICD-10-CM | POA: Diagnosis not present

## 2019-11-20 DIAGNOSIS — Z791 Long term (current) use of non-steroidal anti-inflammatories (NSAID): Secondary | ICD-10-CM | POA: Diagnosis not present

## 2019-11-20 DIAGNOSIS — Z466 Encounter for fitting and adjustment of urinary device: Secondary | ICD-10-CM | POA: Diagnosis not present

## 2019-11-20 DIAGNOSIS — G8250 Quadriplegia, unspecified: Secondary | ICD-10-CM | POA: Diagnosis not present

## 2019-11-21 DIAGNOSIS — Z792 Long term (current) use of antibiotics: Secondary | ICD-10-CM | POA: Diagnosis not present

## 2019-11-21 DIAGNOSIS — Z791 Long term (current) use of non-steroidal anti-inflammatories (NSAID): Secondary | ICD-10-CM | POA: Diagnosis not present

## 2019-11-21 DIAGNOSIS — Z993 Dependence on wheelchair: Secondary | ICD-10-CM | POA: Diagnosis not present

## 2019-11-21 DIAGNOSIS — G834 Cauda equina syndrome: Secondary | ICD-10-CM | POA: Diagnosis not present

## 2019-11-21 DIAGNOSIS — Z466 Encounter for fitting and adjustment of urinary device: Secondary | ICD-10-CM | POA: Diagnosis not present

## 2019-11-21 DIAGNOSIS — G8250 Quadriplegia, unspecified: Secondary | ICD-10-CM | POA: Diagnosis not present

## 2019-11-23 DIAGNOSIS — G834 Cauda equina syndrome: Secondary | ICD-10-CM | POA: Diagnosis not present

## 2019-11-23 DIAGNOSIS — Z993 Dependence on wheelchair: Secondary | ICD-10-CM | POA: Diagnosis not present

## 2019-11-23 DIAGNOSIS — G8250 Quadriplegia, unspecified: Secondary | ICD-10-CM | POA: Diagnosis not present

## 2019-11-23 DIAGNOSIS — Z792 Long term (current) use of antibiotics: Secondary | ICD-10-CM | POA: Diagnosis not present

## 2019-11-23 DIAGNOSIS — Z791 Long term (current) use of non-steroidal anti-inflammatories (NSAID): Secondary | ICD-10-CM | POA: Diagnosis not present

## 2019-11-23 DIAGNOSIS — Z466 Encounter for fitting and adjustment of urinary device: Secondary | ICD-10-CM | POA: Diagnosis not present

## 2019-11-26 DIAGNOSIS — Z791 Long term (current) use of non-steroidal anti-inflammatories (NSAID): Secondary | ICD-10-CM | POA: Diagnosis not present

## 2019-11-26 DIAGNOSIS — G834 Cauda equina syndrome: Secondary | ICD-10-CM | POA: Diagnosis not present

## 2019-11-26 DIAGNOSIS — Z993 Dependence on wheelchair: Secondary | ICD-10-CM | POA: Diagnosis not present

## 2019-11-26 DIAGNOSIS — Z792 Long term (current) use of antibiotics: Secondary | ICD-10-CM | POA: Diagnosis not present

## 2019-11-26 DIAGNOSIS — Z466 Encounter for fitting and adjustment of urinary device: Secondary | ICD-10-CM | POA: Diagnosis not present

## 2019-11-26 DIAGNOSIS — G8250 Quadriplegia, unspecified: Secondary | ICD-10-CM | POA: Diagnosis not present

## 2019-11-28 DIAGNOSIS — G8250 Quadriplegia, unspecified: Secondary | ICD-10-CM | POA: Diagnosis not present

## 2019-11-28 DIAGNOSIS — Z791 Long term (current) use of non-steroidal anti-inflammatories (NSAID): Secondary | ICD-10-CM | POA: Diagnosis not present

## 2019-11-28 DIAGNOSIS — Z466 Encounter for fitting and adjustment of urinary device: Secondary | ICD-10-CM | POA: Diagnosis not present

## 2019-11-28 DIAGNOSIS — Z792 Long term (current) use of antibiotics: Secondary | ICD-10-CM | POA: Diagnosis not present

## 2019-11-28 DIAGNOSIS — G834 Cauda equina syndrome: Secondary | ICD-10-CM | POA: Diagnosis not present

## 2019-11-28 DIAGNOSIS — Z993 Dependence on wheelchair: Secondary | ICD-10-CM | POA: Diagnosis not present

## 2019-11-30 DIAGNOSIS — Z792 Long term (current) use of antibiotics: Secondary | ICD-10-CM | POA: Diagnosis not present

## 2019-11-30 DIAGNOSIS — Z791 Long term (current) use of non-steroidal anti-inflammatories (NSAID): Secondary | ICD-10-CM | POA: Diagnosis not present

## 2019-11-30 DIAGNOSIS — Z993 Dependence on wheelchair: Secondary | ICD-10-CM | POA: Diagnosis not present

## 2019-11-30 DIAGNOSIS — Z466 Encounter for fitting and adjustment of urinary device: Secondary | ICD-10-CM | POA: Diagnosis not present

## 2019-11-30 DIAGNOSIS — G834 Cauda equina syndrome: Secondary | ICD-10-CM | POA: Diagnosis not present

## 2019-11-30 DIAGNOSIS — G8250 Quadriplegia, unspecified: Secondary | ICD-10-CM | POA: Diagnosis not present

## 2019-12-03 DIAGNOSIS — G834 Cauda equina syndrome: Secondary | ICD-10-CM | POA: Diagnosis not present

## 2019-12-03 DIAGNOSIS — G8250 Quadriplegia, unspecified: Secondary | ICD-10-CM | POA: Diagnosis not present

## 2019-12-03 DIAGNOSIS — Z466 Encounter for fitting and adjustment of urinary device: Secondary | ICD-10-CM | POA: Diagnosis not present

## 2019-12-03 DIAGNOSIS — Z993 Dependence on wheelchair: Secondary | ICD-10-CM | POA: Diagnosis not present

## 2019-12-03 DIAGNOSIS — Z791 Long term (current) use of non-steroidal anti-inflammatories (NSAID): Secondary | ICD-10-CM | POA: Diagnosis not present

## 2019-12-03 DIAGNOSIS — Z87440 Personal history of urinary (tract) infections: Secondary | ICD-10-CM | POA: Diagnosis not present

## 2019-12-03 DIAGNOSIS — Z792 Long term (current) use of antibiotics: Secondary | ICD-10-CM | POA: Diagnosis not present

## 2019-12-03 DIAGNOSIS — R339 Retention of urine, unspecified: Secondary | ICD-10-CM | POA: Diagnosis not present

## 2019-12-05 DIAGNOSIS — Z791 Long term (current) use of non-steroidal anti-inflammatories (NSAID): Secondary | ICD-10-CM | POA: Diagnosis not present

## 2019-12-05 DIAGNOSIS — Z993 Dependence on wheelchair: Secondary | ICD-10-CM | POA: Diagnosis not present

## 2019-12-05 DIAGNOSIS — Z792 Long term (current) use of antibiotics: Secondary | ICD-10-CM | POA: Diagnosis not present

## 2019-12-05 DIAGNOSIS — Z466 Encounter for fitting and adjustment of urinary device: Secondary | ICD-10-CM | POA: Diagnosis not present

## 2019-12-05 DIAGNOSIS — G834 Cauda equina syndrome: Secondary | ICD-10-CM | POA: Diagnosis not present

## 2019-12-05 DIAGNOSIS — G8250 Quadriplegia, unspecified: Secondary | ICD-10-CM | POA: Diagnosis not present

## 2019-12-06 DIAGNOSIS — R339 Retention of urine, unspecified: Secondary | ICD-10-CM | POA: Diagnosis not present

## 2019-12-06 DIAGNOSIS — Z87440 Personal history of urinary (tract) infections: Secondary | ICD-10-CM | POA: Diagnosis not present

## 2019-12-06 DIAGNOSIS — G8250 Quadriplegia, unspecified: Secondary | ICD-10-CM | POA: Diagnosis not present

## 2019-12-07 DIAGNOSIS — G8250 Quadriplegia, unspecified: Secondary | ICD-10-CM | POA: Diagnosis not present

## 2019-12-07 DIAGNOSIS — Z993 Dependence on wheelchair: Secondary | ICD-10-CM | POA: Diagnosis not present

## 2019-12-07 DIAGNOSIS — Z791 Long term (current) use of non-steroidal anti-inflammatories (NSAID): Secondary | ICD-10-CM | POA: Diagnosis not present

## 2019-12-07 DIAGNOSIS — G834 Cauda equina syndrome: Secondary | ICD-10-CM | POA: Diagnosis not present

## 2019-12-07 DIAGNOSIS — Z466 Encounter for fitting and adjustment of urinary device: Secondary | ICD-10-CM | POA: Diagnosis not present

## 2019-12-07 DIAGNOSIS — Z792 Long term (current) use of antibiotics: Secondary | ICD-10-CM | POA: Diagnosis not present

## 2019-12-10 DIAGNOSIS — Z792 Long term (current) use of antibiotics: Secondary | ICD-10-CM | POA: Diagnosis not present

## 2019-12-10 DIAGNOSIS — Z466 Encounter for fitting and adjustment of urinary device: Secondary | ICD-10-CM | POA: Diagnosis not present

## 2019-12-10 DIAGNOSIS — G834 Cauda equina syndrome: Secondary | ICD-10-CM | POA: Diagnosis not present

## 2019-12-10 DIAGNOSIS — Z791 Long term (current) use of non-steroidal anti-inflammatories (NSAID): Secondary | ICD-10-CM | POA: Diagnosis not present

## 2019-12-10 DIAGNOSIS — G8250 Quadriplegia, unspecified: Secondary | ICD-10-CM | POA: Diagnosis not present

## 2019-12-10 DIAGNOSIS — Z993 Dependence on wheelchair: Secondary | ICD-10-CM | POA: Diagnosis not present

## 2019-12-11 DIAGNOSIS — G834 Cauda equina syndrome: Secondary | ICD-10-CM | POA: Diagnosis not present

## 2019-12-11 DIAGNOSIS — Z792 Long term (current) use of antibiotics: Secondary | ICD-10-CM | POA: Diagnosis not present

## 2019-12-11 DIAGNOSIS — Z993 Dependence on wheelchair: Secondary | ICD-10-CM | POA: Diagnosis not present

## 2019-12-11 DIAGNOSIS — Z791 Long term (current) use of non-steroidal anti-inflammatories (NSAID): Secondary | ICD-10-CM | POA: Diagnosis not present

## 2019-12-11 DIAGNOSIS — Z466 Encounter for fitting and adjustment of urinary device: Secondary | ICD-10-CM | POA: Diagnosis not present

## 2019-12-11 DIAGNOSIS — G8250 Quadriplegia, unspecified: Secondary | ICD-10-CM | POA: Diagnosis not present

## 2019-12-12 DIAGNOSIS — Z792 Long term (current) use of antibiotics: Secondary | ICD-10-CM | POA: Diagnosis not present

## 2019-12-12 DIAGNOSIS — Z791 Long term (current) use of non-steroidal anti-inflammatories (NSAID): Secondary | ICD-10-CM | POA: Diagnosis not present

## 2019-12-12 DIAGNOSIS — Z466 Encounter for fitting and adjustment of urinary device: Secondary | ICD-10-CM | POA: Diagnosis not present

## 2019-12-12 DIAGNOSIS — Z993 Dependence on wheelchair: Secondary | ICD-10-CM | POA: Diagnosis not present

## 2019-12-12 DIAGNOSIS — G8250 Quadriplegia, unspecified: Secondary | ICD-10-CM | POA: Diagnosis not present

## 2019-12-12 DIAGNOSIS — G834 Cauda equina syndrome: Secondary | ICD-10-CM | POA: Diagnosis not present

## 2019-12-14 DIAGNOSIS — G834 Cauda equina syndrome: Secondary | ICD-10-CM | POA: Diagnosis not present

## 2019-12-14 DIAGNOSIS — Z466 Encounter for fitting and adjustment of urinary device: Secondary | ICD-10-CM | POA: Diagnosis not present

## 2019-12-14 DIAGNOSIS — Z792 Long term (current) use of antibiotics: Secondary | ICD-10-CM | POA: Diagnosis not present

## 2019-12-14 DIAGNOSIS — G8250 Quadriplegia, unspecified: Secondary | ICD-10-CM | POA: Diagnosis not present

## 2019-12-14 DIAGNOSIS — Z791 Long term (current) use of non-steroidal anti-inflammatories (NSAID): Secondary | ICD-10-CM | POA: Diagnosis not present

## 2019-12-14 DIAGNOSIS — Z993 Dependence on wheelchair: Secondary | ICD-10-CM | POA: Diagnosis not present

## 2019-12-17 DIAGNOSIS — G834 Cauda equina syndrome: Secondary | ICD-10-CM | POA: Diagnosis not present

## 2019-12-17 DIAGNOSIS — G8250 Quadriplegia, unspecified: Secondary | ICD-10-CM | POA: Diagnosis not present

## 2019-12-17 DIAGNOSIS — Z466 Encounter for fitting and adjustment of urinary device: Secondary | ICD-10-CM | POA: Diagnosis not present

## 2019-12-17 DIAGNOSIS — Z993 Dependence on wheelchair: Secondary | ICD-10-CM | POA: Diagnosis not present

## 2019-12-17 DIAGNOSIS — Z791 Long term (current) use of non-steroidal anti-inflammatories (NSAID): Secondary | ICD-10-CM | POA: Diagnosis not present

## 2019-12-17 DIAGNOSIS — Z792 Long term (current) use of antibiotics: Secondary | ICD-10-CM | POA: Diagnosis not present

## 2019-12-19 ENCOUNTER — Ambulatory Visit (INDEPENDENT_AMBULATORY_CARE_PROVIDER_SITE_OTHER): Payer: Medicare Other | Admitting: Internal Medicine

## 2019-12-19 ENCOUNTER — Other Ambulatory Visit: Payer: Self-pay

## 2019-12-19 ENCOUNTER — Encounter: Payer: Self-pay | Admitting: Internal Medicine

## 2019-12-19 VITALS — BP 123/74 | HR 72 | Ht 65.0 in | Wt 170.0 lb

## 2019-12-19 DIAGNOSIS — Z792 Long term (current) use of antibiotics: Secondary | ICD-10-CM | POA: Diagnosis not present

## 2019-12-19 DIAGNOSIS — R6889 Other general symptoms and signs: Secondary | ICD-10-CM | POA: Diagnosis not present

## 2019-12-19 DIAGNOSIS — Z23 Encounter for immunization: Secondary | ICD-10-CM | POA: Diagnosis not present

## 2019-12-19 DIAGNOSIS — Z466 Encounter for fitting and adjustment of urinary device: Secondary | ICD-10-CM | POA: Diagnosis not present

## 2019-12-19 DIAGNOSIS — Z0000 Encounter for general adult medical examination without abnormal findings: Secondary | ICD-10-CM

## 2019-12-19 DIAGNOSIS — Z993 Dependence on wheelchair: Secondary | ICD-10-CM

## 2019-12-19 DIAGNOSIS — Z1211 Encounter for screening for malignant neoplasm of colon: Secondary | ICD-10-CM | POA: Insufficient documentation

## 2019-12-19 DIAGNOSIS — Z791 Long term (current) use of non-steroidal anti-inflammatories (NSAID): Secondary | ICD-10-CM | POA: Diagnosis not present

## 2019-12-19 DIAGNOSIS — G8250 Quadriplegia, unspecified: Secondary | ICD-10-CM | POA: Diagnosis not present

## 2019-12-19 DIAGNOSIS — R32 Unspecified urinary incontinence: Secondary | ICD-10-CM

## 2019-12-19 DIAGNOSIS — G834 Cauda equina syndrome: Secondary | ICD-10-CM | POA: Diagnosis not present

## 2019-12-19 MED ORDER — OXYBUTYNIN CHLORIDE ER 15 MG PO TB24: 15.0000 mg | ORAL_TABLET | Freq: Every day | ORAL | 6 refills | Status: DC

## 2019-12-19 MED ORDER — BACLOFEN 10 MG PO TABS: 10.0000 mg | ORAL_TABLET | Freq: Three times a day (TID) | ORAL | 6 refills | Status: DC

## 2019-12-19 NOTE — Assessment & Plan Note (Signed)
Flu shot was administered to the patient.  In the left deltoid.

## 2019-12-19 NOTE — Assessment & Plan Note (Signed)
Patient needs Macrodantin intermittently for urinary tract infection.  She has a home health nurse who watches her urine on a regular basis.  I am going to see her back in 4 months.

## 2019-12-19 NOTE — Assessment & Plan Note (Signed)
Patient is wheelchair-bound and does not complain of any chest pain or shortness of breath.  There is no swelling of the legs.  She has been seeing podiatrist for the feet.  On a regular basis who wanted to do a Doppler study on the leg
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
## 2019-12-19 NOTE — Assessment & Plan Note (Signed)
Patient independently operate her wheelchair.

## 2019-12-20 LAB — CBC WITH DIFFERENTIAL/PLATELET
Absolute Monocytes: 292 cells/uL (ref 200–950)
Basophils Absolute: 28 cells/uL (ref 0–200)
Basophils Relative: 0.5 %
Eosinophils Absolute: 99 cells/uL (ref 15–500)
Eosinophils Relative: 1.8 %
HCT: 41.9 % (ref 35.0–45.0)
Hemoglobin: 13.8 g/dL (ref 11.7–15.5)
Lymphs Abs: 2195 cells/uL (ref 850–3900)
MCH: 29.6 pg (ref 27.0–33.0)
MCHC: 32.9 g/dL (ref 32.0–36.0)
MCV: 89.9 fL (ref 80.0–100.0)
MPV: 12.2 fL (ref 7.5–12.5)
Monocytes Relative: 5.3 %
Neutro Abs: 2888 cells/uL (ref 1500–7800)
Neutrophils Relative %: 52.5 %
Platelets: 203 10*3/uL (ref 140–400)
RBC: 4.66 10*6/uL (ref 3.80–5.10)
RDW: 12.6 % (ref 11.0–15.0)
Total Lymphocyte: 39.9 %
WBC: 5.5 10*3/uL (ref 3.8–10.8)

## 2019-12-20 LAB — COMPLETE METABOLIC PANEL WITH GFR
AG Ratio: 1.3 (calc) (ref 1.0–2.5)
ALT: 11 U/L (ref 6–29)
AST: 16 U/L (ref 10–35)
Albumin: 4 g/dL (ref 3.6–5.1)
Alkaline phosphatase (APISO): 84 U/L (ref 37–153)
BUN/Creatinine Ratio: 34 (calc) — ABNORMAL HIGH (ref 6–22)
BUN: 16 mg/dL (ref 7–25)
CO2: 25 mmol/L (ref 20–32)
Calcium: 9 mg/dL (ref 8.6–10.4)
Chloride: 102 mmol/L (ref 98–110)
Creat: 0.47 mg/dL — ABNORMAL LOW (ref 0.50–0.99)
GFR, Est African American: 124 mL/min/{1.73_m2} (ref >=60)
GFR, Est Non African American: 107 mL/min/{1.73_m2} (ref >=60)
Globulin: 3.1 g/dL (calc) (ref 1.9–3.7)
Glucose, Bld: 84 mg/dL (ref 65–99)
Potassium: 3.8 mmol/L (ref 3.5–5.3)
Sodium: 137 mmol/L (ref 135–146)
Total Bilirubin: 0.5 mg/dL (ref 0.2–1.2)
Total Protein: 7.1 g/dL (ref 6.1–8.1)

## 2019-12-20 LAB — LIPID PANEL
Cholesterol: 189 mg/dL (ref <200)
HDL: 42 mg/dL — ABNORMAL LOW (ref >=50)
LDL Cholesterol (Calc): 126 mg/dL (calc) — ABNORMAL HIGH
Non-HDL Cholesterol (Calc): 147 mg/dL (calc) — ABNORMAL HIGH (ref <130)
Total CHOL/HDL Ratio: 4.5 (calc) (ref <5.0)
Triglycerides: 104 mg/dL (ref <150)

## 2019-12-20 LAB — TSH: TSH: 1.64 mIU/L (ref 0.40–4.50)

## 2019-12-21 DIAGNOSIS — G8250 Quadriplegia, unspecified: Secondary | ICD-10-CM | POA: Diagnosis not present

## 2019-12-21 DIAGNOSIS — Z466 Encounter for fitting and adjustment of urinary device: Secondary | ICD-10-CM | POA: Diagnosis not present

## 2019-12-21 DIAGNOSIS — Z791 Long term (current) use of non-steroidal anti-inflammatories (NSAID): Secondary | ICD-10-CM | POA: Diagnosis not present

## 2019-12-21 DIAGNOSIS — Z993 Dependence on wheelchair: Secondary | ICD-10-CM | POA: Diagnosis not present

## 2019-12-21 DIAGNOSIS — G834 Cauda equina syndrome: Secondary | ICD-10-CM | POA: Diagnosis not present

## 2019-12-21 DIAGNOSIS — Z792 Long term (current) use of antibiotics: Secondary | ICD-10-CM | POA: Diagnosis not present

## 2019-12-24 DIAGNOSIS — Z792 Long term (current) use of antibiotics: Secondary | ICD-10-CM | POA: Diagnosis not present

## 2019-12-24 DIAGNOSIS — Z993 Dependence on wheelchair: Secondary | ICD-10-CM | POA: Diagnosis not present

## 2019-12-24 DIAGNOSIS — Z791 Long term (current) use of non-steroidal anti-inflammatories (NSAID): Secondary | ICD-10-CM | POA: Diagnosis not present

## 2019-12-24 DIAGNOSIS — Z466 Encounter for fitting and adjustment of urinary device: Secondary | ICD-10-CM | POA: Diagnosis not present

## 2019-12-24 DIAGNOSIS — G8250 Quadriplegia, unspecified: Secondary | ICD-10-CM | POA: Diagnosis not present

## 2019-12-24 DIAGNOSIS — G834 Cauda equina syndrome: Secondary | ICD-10-CM | POA: Diagnosis not present

## 2019-12-26 DIAGNOSIS — G8250 Quadriplegia, unspecified: Secondary | ICD-10-CM | POA: Diagnosis not present

## 2019-12-26 DIAGNOSIS — B351 Tinea unguium: Secondary | ICD-10-CM | POA: Diagnosis not present

## 2019-12-26 DIAGNOSIS — R6889 Other general symptoms and signs: Secondary | ICD-10-CM | POA: Diagnosis not present

## 2019-12-26 DIAGNOSIS — G834 Cauda equina syndrome: Secondary | ICD-10-CM | POA: Diagnosis not present

## 2019-12-26 DIAGNOSIS — Z792 Long term (current) use of antibiotics: Secondary | ICD-10-CM | POA: Diagnosis not present

## 2019-12-26 DIAGNOSIS — Z993 Dependence on wheelchair: Secondary | ICD-10-CM | POA: Diagnosis not present

## 2019-12-26 DIAGNOSIS — I739 Peripheral vascular disease, unspecified: Secondary | ICD-10-CM | POA: Diagnosis not present

## 2019-12-26 DIAGNOSIS — Z791 Long term (current) use of non-steroidal anti-inflammatories (NSAID): Secondary | ICD-10-CM | POA: Diagnosis not present

## 2019-12-26 DIAGNOSIS — Z466 Encounter for fitting and adjustment of urinary device: Secondary | ICD-10-CM | POA: Diagnosis not present

## 2019-12-28 DIAGNOSIS — Z792 Long term (current) use of antibiotics: Secondary | ICD-10-CM | POA: Diagnosis not present

## 2019-12-28 DIAGNOSIS — Z466 Encounter for fitting and adjustment of urinary device: Secondary | ICD-10-CM | POA: Diagnosis not present

## 2019-12-28 DIAGNOSIS — Z791 Long term (current) use of non-steroidal anti-inflammatories (NSAID): Secondary | ICD-10-CM | POA: Diagnosis not present

## 2019-12-28 DIAGNOSIS — G8250 Quadriplegia, unspecified: Secondary | ICD-10-CM | POA: Diagnosis not present

## 2019-12-28 DIAGNOSIS — Z993 Dependence on wheelchair: Secondary | ICD-10-CM | POA: Diagnosis not present

## 2019-12-28 DIAGNOSIS — G834 Cauda equina syndrome: Secondary | ICD-10-CM | POA: Diagnosis not present

## 2019-12-31 DIAGNOSIS — G8250 Quadriplegia, unspecified: Secondary | ICD-10-CM | POA: Diagnosis not present

## 2019-12-31 DIAGNOSIS — Z993 Dependence on wheelchair: Secondary | ICD-10-CM | POA: Diagnosis not present

## 2019-12-31 DIAGNOSIS — G834 Cauda equina syndrome: Secondary | ICD-10-CM | POA: Diagnosis not present

## 2019-12-31 DIAGNOSIS — Z466 Encounter for fitting and adjustment of urinary device: Secondary | ICD-10-CM | POA: Diagnosis not present

## 2019-12-31 DIAGNOSIS — Z792 Long term (current) use of antibiotics: Secondary | ICD-10-CM | POA: Diagnosis not present

## 2019-12-31 DIAGNOSIS — Z791 Long term (current) use of non-steroidal anti-inflammatories (NSAID): Secondary | ICD-10-CM | POA: Diagnosis not present

## 2020-01-01 DIAGNOSIS — Z466 Encounter for fitting and adjustment of urinary device: Secondary | ICD-10-CM | POA: Diagnosis not present

## 2020-01-01 DIAGNOSIS — Z993 Dependence on wheelchair: Secondary | ICD-10-CM | POA: Diagnosis not present

## 2020-01-01 DIAGNOSIS — G8250 Quadriplegia, unspecified: Secondary | ICD-10-CM | POA: Diagnosis not present

## 2020-01-01 DIAGNOSIS — G834 Cauda equina syndrome: Secondary | ICD-10-CM | POA: Diagnosis not present

## 2020-01-01 DIAGNOSIS — Z791 Long term (current) use of non-steroidal anti-inflammatories (NSAID): Secondary | ICD-10-CM | POA: Diagnosis not present

## 2020-01-01 DIAGNOSIS — Z792 Long term (current) use of antibiotics: Secondary | ICD-10-CM | POA: Diagnosis not present

## 2020-01-02 DIAGNOSIS — Z87440 Personal history of urinary (tract) infections: Secondary | ICD-10-CM | POA: Diagnosis not present

## 2020-01-02 DIAGNOSIS — R339 Retention of urine, unspecified: Secondary | ICD-10-CM | POA: Diagnosis not present

## 2020-01-02 DIAGNOSIS — Z466 Encounter for fitting and adjustment of urinary device: Secondary | ICD-10-CM | POA: Diagnosis not present

## 2020-01-02 DIAGNOSIS — G834 Cauda equina syndrome: Secondary | ICD-10-CM | POA: Diagnosis not present

## 2020-01-02 DIAGNOSIS — Z792 Long term (current) use of antibiotics: Secondary | ICD-10-CM | POA: Diagnosis not present

## 2020-01-02 DIAGNOSIS — Z993 Dependence on wheelchair: Secondary | ICD-10-CM | POA: Diagnosis not present

## 2020-01-02 DIAGNOSIS — Z791 Long term (current) use of non-steroidal anti-inflammatories (NSAID): Secondary | ICD-10-CM | POA: Diagnosis not present

## 2020-01-02 DIAGNOSIS — G8250 Quadriplegia, unspecified: Secondary | ICD-10-CM | POA: Diagnosis not present

## 2020-01-03 DIAGNOSIS — G8250 Quadriplegia, unspecified: Secondary | ICD-10-CM | POA: Diagnosis not present

## 2020-01-03 DIAGNOSIS — R339 Retention of urine, unspecified: Secondary | ICD-10-CM | POA: Diagnosis not present

## 2020-01-03 DIAGNOSIS — Z87440 Personal history of urinary (tract) infections: Secondary | ICD-10-CM | POA: Diagnosis not present

## 2020-01-04 DIAGNOSIS — G834 Cauda equina syndrome: Secondary | ICD-10-CM | POA: Diagnosis not present

## 2020-01-04 DIAGNOSIS — Z466 Encounter for fitting and adjustment of urinary device: Secondary | ICD-10-CM | POA: Diagnosis not present

## 2020-01-04 DIAGNOSIS — Z791 Long term (current) use of non-steroidal anti-inflammatories (NSAID): Secondary | ICD-10-CM | POA: Diagnosis not present

## 2020-01-04 DIAGNOSIS — Z993 Dependence on wheelchair: Secondary | ICD-10-CM | POA: Diagnosis not present

## 2020-01-04 DIAGNOSIS — Z87440 Personal history of urinary (tract) infections: Secondary | ICD-10-CM | POA: Diagnosis not present

## 2020-01-04 DIAGNOSIS — Z792 Long term (current) use of antibiotics: Secondary | ICD-10-CM | POA: Diagnosis not present

## 2020-01-04 DIAGNOSIS — R339 Retention of urine, unspecified: Secondary | ICD-10-CM | POA: Diagnosis not present

## 2020-01-04 DIAGNOSIS — G8250 Quadriplegia, unspecified: Secondary | ICD-10-CM | POA: Diagnosis not present

## 2020-01-07 DIAGNOSIS — G8250 Quadriplegia, unspecified: Secondary | ICD-10-CM | POA: Diagnosis not present

## 2020-01-07 DIAGNOSIS — G834 Cauda equina syndrome: Secondary | ICD-10-CM | POA: Diagnosis not present

## 2020-01-07 DIAGNOSIS — Z791 Long term (current) use of non-steroidal anti-inflammatories (NSAID): Secondary | ICD-10-CM | POA: Diagnosis not present

## 2020-01-07 DIAGNOSIS — Z792 Long term (current) use of antibiotics: Secondary | ICD-10-CM | POA: Diagnosis not present

## 2020-01-07 DIAGNOSIS — Z466 Encounter for fitting and adjustment of urinary device: Secondary | ICD-10-CM | POA: Diagnosis not present

## 2020-01-07 DIAGNOSIS — Z993 Dependence on wheelchair: Secondary | ICD-10-CM | POA: Diagnosis not present

## 2020-01-09 DIAGNOSIS — Z993 Dependence on wheelchair: Secondary | ICD-10-CM | POA: Diagnosis not present

## 2020-01-09 DIAGNOSIS — R6889 Other general symptoms and signs: Secondary | ICD-10-CM | POA: Diagnosis not present

## 2020-01-09 DIAGNOSIS — Z466 Encounter for fitting and adjustment of urinary device: Secondary | ICD-10-CM | POA: Diagnosis not present

## 2020-01-09 DIAGNOSIS — G834 Cauda equina syndrome: Secondary | ICD-10-CM | POA: Diagnosis not present

## 2020-01-09 DIAGNOSIS — Z791 Long term (current) use of non-steroidal anti-inflammatories (NSAID): Secondary | ICD-10-CM | POA: Diagnosis not present

## 2020-01-09 DIAGNOSIS — Z792 Long term (current) use of antibiotics: Secondary | ICD-10-CM | POA: Diagnosis not present

## 2020-01-09 DIAGNOSIS — G8250 Quadriplegia, unspecified: Secondary | ICD-10-CM | POA: Diagnosis not present

## 2020-01-11 DIAGNOSIS — Z792 Long term (current) use of antibiotics: Secondary | ICD-10-CM | POA: Diagnosis not present

## 2020-01-11 DIAGNOSIS — G834 Cauda equina syndrome: Secondary | ICD-10-CM | POA: Diagnosis not present

## 2020-01-11 DIAGNOSIS — Z791 Long term (current) use of non-steroidal anti-inflammatories (NSAID): Secondary | ICD-10-CM | POA: Diagnosis not present

## 2020-01-11 DIAGNOSIS — G8250 Quadriplegia, unspecified: Secondary | ICD-10-CM | POA: Diagnosis not present

## 2020-01-11 DIAGNOSIS — Z993 Dependence on wheelchair: Secondary | ICD-10-CM | POA: Diagnosis not present

## 2020-01-11 DIAGNOSIS — Z466 Encounter for fitting and adjustment of urinary device: Secondary | ICD-10-CM | POA: Diagnosis not present

## 2020-01-14 DIAGNOSIS — Z791 Long term (current) use of non-steroidal anti-inflammatories (NSAID): Secondary | ICD-10-CM | POA: Diagnosis not present

## 2020-01-14 DIAGNOSIS — Z993 Dependence on wheelchair: Secondary | ICD-10-CM | POA: Diagnosis not present

## 2020-01-14 DIAGNOSIS — Z466 Encounter for fitting and adjustment of urinary device: Secondary | ICD-10-CM | POA: Diagnosis not present

## 2020-01-14 DIAGNOSIS — G834 Cauda equina syndrome: Secondary | ICD-10-CM | POA: Diagnosis not present

## 2020-01-14 DIAGNOSIS — G8250 Quadriplegia, unspecified: Secondary | ICD-10-CM | POA: Diagnosis not present

## 2020-01-14 DIAGNOSIS — Z792 Long term (current) use of antibiotics: Secondary | ICD-10-CM | POA: Diagnosis not present

## 2020-01-15 ENCOUNTER — Other Ambulatory Visit: Payer: Self-pay

## 2020-01-15 DIAGNOSIS — Z993 Dependence on wheelchair: Secondary | ICD-10-CM | POA: Diagnosis not present

## 2020-01-15 DIAGNOSIS — Z466 Encounter for fitting and adjustment of urinary device: Secondary | ICD-10-CM | POA: Diagnosis not present

## 2020-01-15 DIAGNOSIS — G8250 Quadriplegia, unspecified: Secondary | ICD-10-CM | POA: Diagnosis not present

## 2020-01-15 DIAGNOSIS — G834 Cauda equina syndrome: Secondary | ICD-10-CM | POA: Diagnosis not present

## 2020-01-15 DIAGNOSIS — Z791 Long term (current) use of non-steroidal anti-inflammatories (NSAID): Secondary | ICD-10-CM | POA: Diagnosis not present

## 2020-01-15 DIAGNOSIS — Z792 Long term (current) use of antibiotics: Secondary | ICD-10-CM | POA: Diagnosis not present

## 2020-01-16 DIAGNOSIS — Z792 Long term (current) use of antibiotics: Secondary | ICD-10-CM | POA: Diagnosis not present

## 2020-01-16 DIAGNOSIS — G834 Cauda equina syndrome: Secondary | ICD-10-CM | POA: Diagnosis not present

## 2020-01-16 DIAGNOSIS — Z466 Encounter for fitting and adjustment of urinary device: Secondary | ICD-10-CM | POA: Diagnosis not present

## 2020-01-16 DIAGNOSIS — Z993 Dependence on wheelchair: Secondary | ICD-10-CM | POA: Diagnosis not present

## 2020-01-16 DIAGNOSIS — Z791 Long term (current) use of non-steroidal anti-inflammatories (NSAID): Secondary | ICD-10-CM | POA: Diagnosis not present

## 2020-01-16 DIAGNOSIS — G8250 Quadriplegia, unspecified: Secondary | ICD-10-CM | POA: Diagnosis not present

## 2020-01-18 DIAGNOSIS — G8250 Quadriplegia, unspecified: Secondary | ICD-10-CM | POA: Diagnosis not present

## 2020-01-18 DIAGNOSIS — Z792 Long term (current) use of antibiotics: Secondary | ICD-10-CM | POA: Diagnosis not present

## 2020-01-18 DIAGNOSIS — Z9181 History of falling: Secondary | ICD-10-CM | POA: Diagnosis not present

## 2020-01-18 DIAGNOSIS — Z993 Dependence on wheelchair: Secondary | ICD-10-CM | POA: Diagnosis not present

## 2020-01-18 DIAGNOSIS — G834 Cauda equina syndrome: Secondary | ICD-10-CM | POA: Diagnosis not present

## 2020-01-18 DIAGNOSIS — Z466 Encounter for fitting and adjustment of urinary device: Secondary | ICD-10-CM | POA: Diagnosis not present

## 2020-01-18 DIAGNOSIS — Z791 Long term (current) use of non-steroidal anti-inflammatories (NSAID): Secondary | ICD-10-CM | POA: Diagnosis not present

## 2020-01-21 DIAGNOSIS — Z9181 History of falling: Secondary | ICD-10-CM | POA: Diagnosis not present

## 2020-01-21 DIAGNOSIS — Z993 Dependence on wheelchair: Secondary | ICD-10-CM | POA: Diagnosis not present

## 2020-01-21 DIAGNOSIS — G8250 Quadriplegia, unspecified: Secondary | ICD-10-CM | POA: Diagnosis not present

## 2020-01-21 DIAGNOSIS — Z792 Long term (current) use of antibiotics: Secondary | ICD-10-CM | POA: Diagnosis not present

## 2020-01-21 DIAGNOSIS — Z791 Long term (current) use of non-steroidal anti-inflammatories (NSAID): Secondary | ICD-10-CM | POA: Diagnosis not present

## 2020-01-21 DIAGNOSIS — Z466 Encounter for fitting and adjustment of urinary device: Secondary | ICD-10-CM | POA: Diagnosis not present

## 2020-01-21 DIAGNOSIS — G834 Cauda equina syndrome: Secondary | ICD-10-CM | POA: Diagnosis not present

## 2020-01-22 DIAGNOSIS — G8250 Quadriplegia, unspecified: Secondary | ICD-10-CM | POA: Diagnosis not present

## 2020-01-22 DIAGNOSIS — Z792 Long term (current) use of antibiotics: Secondary | ICD-10-CM | POA: Diagnosis not present

## 2020-01-22 DIAGNOSIS — G834 Cauda equina syndrome: Secondary | ICD-10-CM | POA: Diagnosis not present

## 2020-01-22 DIAGNOSIS — Z466 Encounter for fitting and adjustment of urinary device: Secondary | ICD-10-CM | POA: Diagnosis not present

## 2020-01-22 DIAGNOSIS — Z993 Dependence on wheelchair: Secondary | ICD-10-CM | POA: Diagnosis not present

## 2020-01-22 DIAGNOSIS — Z9181 History of falling: Secondary | ICD-10-CM | POA: Diagnosis not present

## 2020-01-22 DIAGNOSIS — Z791 Long term (current) use of non-steroidal anti-inflammatories (NSAID): Secondary | ICD-10-CM | POA: Diagnosis not present

## 2020-01-23 DIAGNOSIS — Z993 Dependence on wheelchair: Secondary | ICD-10-CM | POA: Diagnosis not present

## 2020-01-23 DIAGNOSIS — G834 Cauda equina syndrome: Secondary | ICD-10-CM | POA: Diagnosis not present

## 2020-01-23 DIAGNOSIS — Z466 Encounter for fitting and adjustment of urinary device: Secondary | ICD-10-CM | POA: Diagnosis not present

## 2020-01-23 DIAGNOSIS — G8250 Quadriplegia, unspecified: Secondary | ICD-10-CM | POA: Diagnosis not present

## 2020-01-23 DIAGNOSIS — Z9181 History of falling: Secondary | ICD-10-CM | POA: Diagnosis not present

## 2020-01-23 DIAGNOSIS — Z791 Long term (current) use of non-steroidal anti-inflammatories (NSAID): Secondary | ICD-10-CM | POA: Diagnosis not present

## 2020-01-23 DIAGNOSIS — Z792 Long term (current) use of antibiotics: Secondary | ICD-10-CM | POA: Diagnosis not present

## 2020-01-25 DIAGNOSIS — Z466 Encounter for fitting and adjustment of urinary device: Secondary | ICD-10-CM | POA: Diagnosis not present

## 2020-01-25 DIAGNOSIS — G834 Cauda equina syndrome: Secondary | ICD-10-CM | POA: Diagnosis not present

## 2020-01-25 DIAGNOSIS — Z9181 History of falling: Secondary | ICD-10-CM | POA: Diagnosis not present

## 2020-01-25 DIAGNOSIS — Z792 Long term (current) use of antibiotics: Secondary | ICD-10-CM | POA: Diagnosis not present

## 2020-01-25 DIAGNOSIS — G8250 Quadriplegia, unspecified: Secondary | ICD-10-CM | POA: Diagnosis not present

## 2020-01-25 DIAGNOSIS — Z993 Dependence on wheelchair: Secondary | ICD-10-CM | POA: Diagnosis not present

## 2020-01-25 DIAGNOSIS — Z791 Long term (current) use of non-steroidal anti-inflammatories (NSAID): Secondary | ICD-10-CM | POA: Diagnosis not present

## 2020-01-28 DIAGNOSIS — Z466 Encounter for fitting and adjustment of urinary device: Secondary | ICD-10-CM | POA: Diagnosis not present

## 2020-01-28 DIAGNOSIS — Z993 Dependence on wheelchair: Secondary | ICD-10-CM | POA: Diagnosis not present

## 2020-01-28 DIAGNOSIS — G834 Cauda equina syndrome: Secondary | ICD-10-CM | POA: Diagnosis not present

## 2020-01-28 DIAGNOSIS — Z791 Long term (current) use of non-steroidal anti-inflammatories (NSAID): Secondary | ICD-10-CM | POA: Diagnosis not present

## 2020-01-28 DIAGNOSIS — G8250 Quadriplegia, unspecified: Secondary | ICD-10-CM | POA: Diagnosis not present

## 2020-01-28 DIAGNOSIS — Z9181 History of falling: Secondary | ICD-10-CM | POA: Diagnosis not present

## 2020-01-28 DIAGNOSIS — Z792 Long term (current) use of antibiotics: Secondary | ICD-10-CM | POA: Diagnosis not present

## 2020-01-29 DIAGNOSIS — Z792 Long term (current) use of antibiotics: Secondary | ICD-10-CM | POA: Diagnosis not present

## 2020-01-29 DIAGNOSIS — Z9181 History of falling: Secondary | ICD-10-CM | POA: Diagnosis not present

## 2020-01-29 DIAGNOSIS — Z993 Dependence on wheelchair: Secondary | ICD-10-CM | POA: Diagnosis not present

## 2020-01-29 DIAGNOSIS — G8250 Quadriplegia, unspecified: Secondary | ICD-10-CM | POA: Diagnosis not present

## 2020-01-29 DIAGNOSIS — Z466 Encounter for fitting and adjustment of urinary device: Secondary | ICD-10-CM | POA: Diagnosis not present

## 2020-01-29 DIAGNOSIS — Z791 Long term (current) use of non-steroidal anti-inflammatories (NSAID): Secondary | ICD-10-CM | POA: Diagnosis not present

## 2020-01-29 DIAGNOSIS — G834 Cauda equina syndrome: Secondary | ICD-10-CM | POA: Diagnosis not present

## 2020-02-01 DIAGNOSIS — Z466 Encounter for fitting and adjustment of urinary device: Secondary | ICD-10-CM | POA: Diagnosis not present

## 2020-02-01 DIAGNOSIS — G834 Cauda equina syndrome: Secondary | ICD-10-CM | POA: Diagnosis not present

## 2020-02-01 DIAGNOSIS — G8250 Quadriplegia, unspecified: Secondary | ICD-10-CM | POA: Diagnosis not present

## 2020-02-01 DIAGNOSIS — Z9181 History of falling: Secondary | ICD-10-CM | POA: Diagnosis not present

## 2020-02-01 DIAGNOSIS — Z791 Long term (current) use of non-steroidal anti-inflammatories (NSAID): Secondary | ICD-10-CM | POA: Diagnosis not present

## 2020-02-01 DIAGNOSIS — Z792 Long term (current) use of antibiotics: Secondary | ICD-10-CM | POA: Diagnosis not present

## 2020-02-01 DIAGNOSIS — Z993 Dependence on wheelchair: Secondary | ICD-10-CM | POA: Diagnosis not present

## 2020-02-04 DIAGNOSIS — R339 Retention of urine, unspecified: Secondary | ICD-10-CM | POA: Diagnosis not present

## 2020-02-04 DIAGNOSIS — G834 Cauda equina syndrome: Secondary | ICD-10-CM | POA: Diagnosis not present

## 2020-02-04 DIAGNOSIS — G8250 Quadriplegia, unspecified: Secondary | ICD-10-CM | POA: Diagnosis not present

## 2020-02-04 DIAGNOSIS — Z792 Long term (current) use of antibiotics: Secondary | ICD-10-CM | POA: Diagnosis not present

## 2020-02-04 DIAGNOSIS — Z993 Dependence on wheelchair: Secondary | ICD-10-CM | POA: Diagnosis not present

## 2020-02-04 DIAGNOSIS — Z466 Encounter for fitting and adjustment of urinary device: Secondary | ICD-10-CM | POA: Diagnosis not present

## 2020-02-04 DIAGNOSIS — Z87440 Personal history of urinary (tract) infections: Secondary | ICD-10-CM | POA: Diagnosis not present

## 2020-02-04 DIAGNOSIS — Z791 Long term (current) use of non-steroidal anti-inflammatories (NSAID): Secondary | ICD-10-CM | POA: Diagnosis not present

## 2020-02-04 DIAGNOSIS — Z9181 History of falling: Secondary | ICD-10-CM | POA: Diagnosis not present

## 2020-02-06 DIAGNOSIS — Z466 Encounter for fitting and adjustment of urinary device: Secondary | ICD-10-CM | POA: Diagnosis not present

## 2020-02-06 DIAGNOSIS — Z792 Long term (current) use of antibiotics: Secondary | ICD-10-CM | POA: Diagnosis not present

## 2020-02-06 DIAGNOSIS — Z9181 History of falling: Secondary | ICD-10-CM | POA: Diagnosis not present

## 2020-02-06 DIAGNOSIS — Z993 Dependence on wheelchair: Secondary | ICD-10-CM | POA: Diagnosis not present

## 2020-02-06 DIAGNOSIS — G8250 Quadriplegia, unspecified: Secondary | ICD-10-CM | POA: Diagnosis not present

## 2020-02-06 DIAGNOSIS — Z791 Long term (current) use of non-steroidal anti-inflammatories (NSAID): Secondary | ICD-10-CM | POA: Diagnosis not present

## 2020-02-06 DIAGNOSIS — G834 Cauda equina syndrome: Secondary | ICD-10-CM | POA: Diagnosis not present

## 2020-02-08 DIAGNOSIS — Z9181 History of falling: Secondary | ICD-10-CM | POA: Diagnosis not present

## 2020-02-08 DIAGNOSIS — Z993 Dependence on wheelchair: Secondary | ICD-10-CM | POA: Diagnosis not present

## 2020-02-08 DIAGNOSIS — Z466 Encounter for fitting and adjustment of urinary device: Secondary | ICD-10-CM | POA: Diagnosis not present

## 2020-02-08 DIAGNOSIS — Z792 Long term (current) use of antibiotics: Secondary | ICD-10-CM | POA: Diagnosis not present

## 2020-02-08 DIAGNOSIS — G834 Cauda equina syndrome: Secondary | ICD-10-CM | POA: Diagnosis not present

## 2020-02-08 DIAGNOSIS — Z791 Long term (current) use of non-steroidal anti-inflammatories (NSAID): Secondary | ICD-10-CM | POA: Diagnosis not present

## 2020-02-08 DIAGNOSIS — G8250 Quadriplegia, unspecified: Secondary | ICD-10-CM | POA: Diagnosis not present

## 2020-02-11 DIAGNOSIS — G8250 Quadriplegia, unspecified: Secondary | ICD-10-CM | POA: Diagnosis not present

## 2020-02-11 DIAGNOSIS — Z9181 History of falling: Secondary | ICD-10-CM | POA: Diagnosis not present

## 2020-02-11 DIAGNOSIS — G834 Cauda equina syndrome: Secondary | ICD-10-CM | POA: Diagnosis not present

## 2020-02-11 DIAGNOSIS — Z792 Long term (current) use of antibiotics: Secondary | ICD-10-CM | POA: Diagnosis not present

## 2020-02-11 DIAGNOSIS — Z993 Dependence on wheelchair: Secondary | ICD-10-CM | POA: Diagnosis not present

## 2020-02-11 DIAGNOSIS — Z791 Long term (current) use of non-steroidal anti-inflammatories (NSAID): Secondary | ICD-10-CM | POA: Diagnosis not present

## 2020-02-11 DIAGNOSIS — Z466 Encounter for fitting and adjustment of urinary device: Secondary | ICD-10-CM | POA: Diagnosis not present

## 2020-02-12 DIAGNOSIS — G8250 Quadriplegia, unspecified: Secondary | ICD-10-CM | POA: Diagnosis not present

## 2020-02-12 DIAGNOSIS — Z791 Long term (current) use of non-steroidal anti-inflammatories (NSAID): Secondary | ICD-10-CM | POA: Diagnosis not present

## 2020-02-12 DIAGNOSIS — Z466 Encounter for fitting and adjustment of urinary device: Secondary | ICD-10-CM | POA: Diagnosis not present

## 2020-02-12 DIAGNOSIS — Z9181 History of falling: Secondary | ICD-10-CM | POA: Diagnosis not present

## 2020-02-12 DIAGNOSIS — Z993 Dependence on wheelchair: Secondary | ICD-10-CM | POA: Diagnosis not present

## 2020-02-12 DIAGNOSIS — G834 Cauda equina syndrome: Secondary | ICD-10-CM | POA: Diagnosis not present

## 2020-02-12 DIAGNOSIS — Z792 Long term (current) use of antibiotics: Secondary | ICD-10-CM | POA: Diagnosis not present

## 2020-02-13 DIAGNOSIS — Z791 Long term (current) use of non-steroidal anti-inflammatories (NSAID): Secondary | ICD-10-CM | POA: Diagnosis not present

## 2020-02-13 DIAGNOSIS — G8250 Quadriplegia, unspecified: Secondary | ICD-10-CM | POA: Diagnosis not present

## 2020-02-13 DIAGNOSIS — Z466 Encounter for fitting and adjustment of urinary device: Secondary | ICD-10-CM | POA: Diagnosis not present

## 2020-02-13 DIAGNOSIS — Z792 Long term (current) use of antibiotics: Secondary | ICD-10-CM | POA: Diagnosis not present

## 2020-02-13 DIAGNOSIS — Z9181 History of falling: Secondary | ICD-10-CM | POA: Diagnosis not present

## 2020-02-13 DIAGNOSIS — G834 Cauda equina syndrome: Secondary | ICD-10-CM | POA: Diagnosis not present

## 2020-02-13 DIAGNOSIS — Z993 Dependence on wheelchair: Secondary | ICD-10-CM | POA: Diagnosis not present

## 2020-02-14 ENCOUNTER — Other Ambulatory Visit: Payer: Self-pay | Admitting: *Deleted

## 2020-02-14 MED ORDER — NITROFURANTOIN MACROCRYSTAL 50 MG PO CAPS: 50.0000 mg | ORAL_CAPSULE | Freq: Every day | ORAL | 3 refills | Status: DC

## 2020-02-15 DIAGNOSIS — G834 Cauda equina syndrome: Secondary | ICD-10-CM | POA: Diagnosis not present

## 2020-02-15 DIAGNOSIS — Z791 Long term (current) use of non-steroidal anti-inflammatories (NSAID): Secondary | ICD-10-CM | POA: Diagnosis not present

## 2020-02-15 DIAGNOSIS — Z792 Long term (current) use of antibiotics: Secondary | ICD-10-CM | POA: Diagnosis not present

## 2020-02-15 DIAGNOSIS — Z466 Encounter for fitting and adjustment of urinary device: Secondary | ICD-10-CM | POA: Diagnosis not present

## 2020-02-15 DIAGNOSIS — Z993 Dependence on wheelchair: Secondary | ICD-10-CM | POA: Diagnosis not present

## 2020-02-15 DIAGNOSIS — G8250 Quadriplegia, unspecified: Secondary | ICD-10-CM | POA: Diagnosis not present

## 2020-02-15 DIAGNOSIS — Z9181 History of falling: Secondary | ICD-10-CM | POA: Diagnosis not present

## 2020-02-18 DIAGNOSIS — G8250 Quadriplegia, unspecified: Secondary | ICD-10-CM | POA: Diagnosis not present

## 2020-02-18 DIAGNOSIS — Z792 Long term (current) use of antibiotics: Secondary | ICD-10-CM | POA: Diagnosis not present

## 2020-02-18 DIAGNOSIS — Z791 Long term (current) use of non-steroidal anti-inflammatories (NSAID): Secondary | ICD-10-CM | POA: Diagnosis not present

## 2020-02-18 DIAGNOSIS — Z993 Dependence on wheelchair: Secondary | ICD-10-CM | POA: Diagnosis not present

## 2020-02-18 DIAGNOSIS — Z466 Encounter for fitting and adjustment of urinary device: Secondary | ICD-10-CM | POA: Diagnosis not present

## 2020-02-18 DIAGNOSIS — G834 Cauda equina syndrome: Secondary | ICD-10-CM | POA: Diagnosis not present

## 2020-02-18 DIAGNOSIS — Z9181 History of falling: Secondary | ICD-10-CM | POA: Diagnosis not present

## 2020-02-20 DIAGNOSIS — Z9181 History of falling: Secondary | ICD-10-CM | POA: Diagnosis not present

## 2020-02-20 DIAGNOSIS — G8250 Quadriplegia, unspecified: Secondary | ICD-10-CM | POA: Diagnosis not present

## 2020-02-20 DIAGNOSIS — Z792 Long term (current) use of antibiotics: Secondary | ICD-10-CM | POA: Diagnosis not present

## 2020-02-20 DIAGNOSIS — Z791 Long term (current) use of non-steroidal anti-inflammatories (NSAID): Secondary | ICD-10-CM | POA: Diagnosis not present

## 2020-02-20 DIAGNOSIS — Z993 Dependence on wheelchair: Secondary | ICD-10-CM | POA: Diagnosis not present

## 2020-02-20 DIAGNOSIS — G834 Cauda equina syndrome: Secondary | ICD-10-CM | POA: Diagnosis not present

## 2020-02-20 DIAGNOSIS — Z466 Encounter for fitting and adjustment of urinary device: Secondary | ICD-10-CM | POA: Diagnosis not present

## 2020-02-22 DIAGNOSIS — G8250 Quadriplegia, unspecified: Secondary | ICD-10-CM | POA: Diagnosis not present

## 2020-02-22 DIAGNOSIS — Z993 Dependence on wheelchair: Secondary | ICD-10-CM | POA: Diagnosis not present

## 2020-02-22 DIAGNOSIS — Z466 Encounter for fitting and adjustment of urinary device: Secondary | ICD-10-CM | POA: Diagnosis not present

## 2020-02-22 DIAGNOSIS — G834 Cauda equina syndrome: Secondary | ICD-10-CM | POA: Diagnosis not present

## 2020-02-22 DIAGNOSIS — Z792 Long term (current) use of antibiotics: Secondary | ICD-10-CM | POA: Diagnosis not present

## 2020-02-22 DIAGNOSIS — Z791 Long term (current) use of non-steroidal anti-inflammatories (NSAID): Secondary | ICD-10-CM | POA: Diagnosis not present

## 2020-02-22 DIAGNOSIS — Z9181 History of falling: Secondary | ICD-10-CM | POA: Diagnosis not present

## 2020-02-25 DIAGNOSIS — Z466 Encounter for fitting and adjustment of urinary device: Secondary | ICD-10-CM | POA: Diagnosis not present

## 2020-02-25 DIAGNOSIS — Z791 Long term (current) use of non-steroidal anti-inflammatories (NSAID): Secondary | ICD-10-CM | POA: Diagnosis not present

## 2020-02-25 DIAGNOSIS — Z9181 History of falling: Secondary | ICD-10-CM | POA: Diagnosis not present

## 2020-02-25 DIAGNOSIS — Z792 Long term (current) use of antibiotics: Secondary | ICD-10-CM | POA: Diagnosis not present

## 2020-02-25 DIAGNOSIS — Z993 Dependence on wheelchair: Secondary | ICD-10-CM | POA: Diagnosis not present

## 2020-02-25 DIAGNOSIS — G834 Cauda equina syndrome: Secondary | ICD-10-CM | POA: Diagnosis not present

## 2020-02-25 DIAGNOSIS — G8250 Quadriplegia, unspecified: Secondary | ICD-10-CM | POA: Diagnosis not present

## 2020-02-27 DIAGNOSIS — Z792 Long term (current) use of antibiotics: Secondary | ICD-10-CM | POA: Diagnosis not present

## 2020-02-27 DIAGNOSIS — Z466 Encounter for fitting and adjustment of urinary device: Secondary | ICD-10-CM | POA: Diagnosis not present

## 2020-02-27 DIAGNOSIS — Z993 Dependence on wheelchair: Secondary | ICD-10-CM | POA: Diagnosis not present

## 2020-02-27 DIAGNOSIS — G8250 Quadriplegia, unspecified: Secondary | ICD-10-CM | POA: Diagnosis not present

## 2020-02-27 DIAGNOSIS — Z9181 History of falling: Secondary | ICD-10-CM | POA: Diagnosis not present

## 2020-02-27 DIAGNOSIS — Z791 Long term (current) use of non-steroidal anti-inflammatories (NSAID): Secondary | ICD-10-CM | POA: Diagnosis not present

## 2020-02-27 DIAGNOSIS — G834 Cauda equina syndrome: Secondary | ICD-10-CM | POA: Diagnosis not present

## 2020-02-28 DIAGNOSIS — G834 Cauda equina syndrome: Secondary | ICD-10-CM | POA: Diagnosis not present

## 2020-02-28 DIAGNOSIS — G8250 Quadriplegia, unspecified: Secondary | ICD-10-CM | POA: Diagnosis not present

## 2020-02-28 DIAGNOSIS — Z792 Long term (current) use of antibiotics: Secondary | ICD-10-CM | POA: Diagnosis not present

## 2020-02-28 DIAGNOSIS — Z9181 History of falling: Secondary | ICD-10-CM | POA: Diagnosis not present

## 2020-02-28 DIAGNOSIS — Z791 Long term (current) use of non-steroidal anti-inflammatories (NSAID): Secondary | ICD-10-CM | POA: Diagnosis not present

## 2020-02-28 DIAGNOSIS — Z993 Dependence on wheelchair: Secondary | ICD-10-CM | POA: Diagnosis not present

## 2020-02-28 DIAGNOSIS — Z466 Encounter for fitting and adjustment of urinary device: Secondary | ICD-10-CM | POA: Diagnosis not present

## 2020-03-03 DIAGNOSIS — Z791 Long term (current) use of non-steroidal anti-inflammatories (NSAID): Secondary | ICD-10-CM | POA: Diagnosis not present

## 2020-03-03 DIAGNOSIS — G8250 Quadriplegia, unspecified: Secondary | ICD-10-CM | POA: Diagnosis not present

## 2020-03-03 DIAGNOSIS — Z466 Encounter for fitting and adjustment of urinary device: Secondary | ICD-10-CM | POA: Diagnosis not present

## 2020-03-03 DIAGNOSIS — Z9181 History of falling: Secondary | ICD-10-CM | POA: Diagnosis not present

## 2020-03-03 DIAGNOSIS — Z792 Long term (current) use of antibiotics: Secondary | ICD-10-CM | POA: Diagnosis not present

## 2020-03-03 DIAGNOSIS — G834 Cauda equina syndrome: Secondary | ICD-10-CM | POA: Diagnosis not present

## 2020-03-03 DIAGNOSIS — Z993 Dependence on wheelchair: Secondary | ICD-10-CM | POA: Diagnosis not present

## 2020-03-04 DIAGNOSIS — Z466 Encounter for fitting and adjustment of urinary device: Secondary | ICD-10-CM | POA: Diagnosis not present

## 2020-03-04 DIAGNOSIS — Z993 Dependence on wheelchair: Secondary | ICD-10-CM | POA: Diagnosis not present

## 2020-03-04 DIAGNOSIS — G834 Cauda equina syndrome: Secondary | ICD-10-CM | POA: Diagnosis not present

## 2020-03-04 DIAGNOSIS — Z9181 History of falling: Secondary | ICD-10-CM | POA: Diagnosis not present

## 2020-03-04 DIAGNOSIS — Z792 Long term (current) use of antibiotics: Secondary | ICD-10-CM | POA: Diagnosis not present

## 2020-03-04 DIAGNOSIS — Z791 Long term (current) use of non-steroidal anti-inflammatories (NSAID): Secondary | ICD-10-CM | POA: Diagnosis not present

## 2020-03-04 DIAGNOSIS — G8250 Quadriplegia, unspecified: Secondary | ICD-10-CM | POA: Diagnosis not present

## 2020-03-05 DIAGNOSIS — Z9181 History of falling: Secondary | ICD-10-CM | POA: Diagnosis not present

## 2020-03-05 DIAGNOSIS — G834 Cauda equina syndrome: Secondary | ICD-10-CM | POA: Diagnosis not present

## 2020-03-05 DIAGNOSIS — Z466 Encounter for fitting and adjustment of urinary device: Secondary | ICD-10-CM | POA: Diagnosis not present

## 2020-03-05 DIAGNOSIS — G8250 Quadriplegia, unspecified: Secondary | ICD-10-CM | POA: Diagnosis not present

## 2020-03-05 DIAGNOSIS — Z791 Long term (current) use of non-steroidal anti-inflammatories (NSAID): Secondary | ICD-10-CM | POA: Diagnosis not present

## 2020-03-05 DIAGNOSIS — Z993 Dependence on wheelchair: Secondary | ICD-10-CM | POA: Diagnosis not present

## 2020-03-05 DIAGNOSIS — Z87440 Personal history of urinary (tract) infections: Secondary | ICD-10-CM | POA: Diagnosis not present

## 2020-03-05 DIAGNOSIS — R339 Retention of urine, unspecified: Secondary | ICD-10-CM | POA: Diagnosis not present

## 2020-03-05 DIAGNOSIS — Z792 Long term (current) use of antibiotics: Secondary | ICD-10-CM | POA: Diagnosis not present

## 2020-03-06 DIAGNOSIS — G8250 Quadriplegia, unspecified: Secondary | ICD-10-CM | POA: Diagnosis not present

## 2020-03-06 DIAGNOSIS — Z792 Long term (current) use of antibiotics: Secondary | ICD-10-CM | POA: Diagnosis not present

## 2020-03-06 DIAGNOSIS — Z9181 History of falling: Secondary | ICD-10-CM | POA: Diagnosis not present

## 2020-03-06 DIAGNOSIS — Z791 Long term (current) use of non-steroidal anti-inflammatories (NSAID): Secondary | ICD-10-CM | POA: Diagnosis not present

## 2020-03-06 DIAGNOSIS — G834 Cauda equina syndrome: Secondary | ICD-10-CM | POA: Diagnosis not present

## 2020-03-06 DIAGNOSIS — Z993 Dependence on wheelchair: Secondary | ICD-10-CM | POA: Diagnosis not present

## 2020-03-06 DIAGNOSIS — Z466 Encounter for fitting and adjustment of urinary device: Secondary | ICD-10-CM | POA: Diagnosis not present

## 2020-03-11 DIAGNOSIS — Z791 Long term (current) use of non-steroidal anti-inflammatories (NSAID): Secondary | ICD-10-CM | POA: Diagnosis not present

## 2020-03-11 DIAGNOSIS — Z792 Long term (current) use of antibiotics: Secondary | ICD-10-CM | POA: Diagnosis not present

## 2020-03-11 DIAGNOSIS — Z466 Encounter for fitting and adjustment of urinary device: Secondary | ICD-10-CM | POA: Diagnosis not present

## 2020-03-11 DIAGNOSIS — G834 Cauda equina syndrome: Secondary | ICD-10-CM | POA: Diagnosis not present

## 2020-03-11 DIAGNOSIS — Z993 Dependence on wheelchair: Secondary | ICD-10-CM | POA: Diagnosis not present

## 2020-03-11 DIAGNOSIS — Z9181 History of falling: Secondary | ICD-10-CM | POA: Diagnosis not present

## 2020-03-11 DIAGNOSIS — G8250 Quadriplegia, unspecified: Secondary | ICD-10-CM | POA: Diagnosis not present

## 2020-03-12 DIAGNOSIS — Z792 Long term (current) use of antibiotics: Secondary | ICD-10-CM | POA: Diagnosis not present

## 2020-03-12 DIAGNOSIS — G8250 Quadriplegia, unspecified: Secondary | ICD-10-CM | POA: Diagnosis not present

## 2020-03-12 DIAGNOSIS — Z993 Dependence on wheelchair: Secondary | ICD-10-CM | POA: Diagnosis not present

## 2020-03-12 DIAGNOSIS — Z791 Long term (current) use of non-steroidal anti-inflammatories (NSAID): Secondary | ICD-10-CM | POA: Diagnosis not present

## 2020-03-12 DIAGNOSIS — Z9181 History of falling: Secondary | ICD-10-CM | POA: Diagnosis not present

## 2020-03-12 DIAGNOSIS — Z466 Encounter for fitting and adjustment of urinary device: Secondary | ICD-10-CM | POA: Diagnosis not present

## 2020-03-12 DIAGNOSIS — G834 Cauda equina syndrome: Secondary | ICD-10-CM | POA: Diagnosis not present

## 2020-03-13 DIAGNOSIS — Z993 Dependence on wheelchair: Secondary | ICD-10-CM | POA: Diagnosis not present

## 2020-03-13 DIAGNOSIS — G834 Cauda equina syndrome: Secondary | ICD-10-CM | POA: Diagnosis not present

## 2020-03-13 DIAGNOSIS — Z792 Long term (current) use of antibiotics: Secondary | ICD-10-CM | POA: Diagnosis not present

## 2020-03-13 DIAGNOSIS — Z791 Long term (current) use of non-steroidal anti-inflammatories (NSAID): Secondary | ICD-10-CM | POA: Diagnosis not present

## 2020-03-13 DIAGNOSIS — Z9181 History of falling: Secondary | ICD-10-CM | POA: Diagnosis not present

## 2020-03-13 DIAGNOSIS — G8250 Quadriplegia, unspecified: Secondary | ICD-10-CM | POA: Diagnosis not present

## 2020-03-13 DIAGNOSIS — Z466 Encounter for fitting and adjustment of urinary device: Secondary | ICD-10-CM | POA: Diagnosis not present

## 2020-03-14 DIAGNOSIS — G834 Cauda equina syndrome: Secondary | ICD-10-CM | POA: Diagnosis not present

## 2020-03-14 DIAGNOSIS — Z993 Dependence on wheelchair: Secondary | ICD-10-CM | POA: Diagnosis not present

## 2020-03-14 DIAGNOSIS — G8250 Quadriplegia, unspecified: Secondary | ICD-10-CM | POA: Diagnosis not present

## 2020-03-14 DIAGNOSIS — Z466 Encounter for fitting and adjustment of urinary device: Secondary | ICD-10-CM | POA: Diagnosis not present

## 2020-03-14 DIAGNOSIS — Z791 Long term (current) use of non-steroidal anti-inflammatories (NSAID): Secondary | ICD-10-CM | POA: Diagnosis not present

## 2020-03-14 DIAGNOSIS — Z9181 History of falling: Secondary | ICD-10-CM | POA: Diagnosis not present

## 2020-03-14 DIAGNOSIS — Z792 Long term (current) use of antibiotics: Secondary | ICD-10-CM | POA: Diagnosis not present

## 2020-03-17 DIAGNOSIS — Z791 Long term (current) use of non-steroidal anti-inflammatories (NSAID): Secondary | ICD-10-CM | POA: Diagnosis not present

## 2020-03-17 DIAGNOSIS — G834 Cauda equina syndrome: Secondary | ICD-10-CM | POA: Diagnosis not present

## 2020-03-17 DIAGNOSIS — Z466 Encounter for fitting and adjustment of urinary device: Secondary | ICD-10-CM | POA: Diagnosis not present

## 2020-03-17 DIAGNOSIS — G8250 Quadriplegia, unspecified: Secondary | ICD-10-CM | POA: Diagnosis not present

## 2020-03-17 DIAGNOSIS — Z993 Dependence on wheelchair: Secondary | ICD-10-CM | POA: Diagnosis not present

## 2020-03-17 DIAGNOSIS — Z9181 History of falling: Secondary | ICD-10-CM | POA: Diagnosis not present

## 2020-03-17 DIAGNOSIS — Z792 Long term (current) use of antibiotics: Secondary | ICD-10-CM | POA: Diagnosis not present

## 2020-03-26 DIAGNOSIS — G8250 Quadriplegia, unspecified: Secondary | ICD-10-CM | POA: Diagnosis not present

## 2020-03-26 DIAGNOSIS — Z466 Encounter for fitting and adjustment of urinary device: Secondary | ICD-10-CM | POA: Diagnosis not present

## 2020-03-26 DIAGNOSIS — Z993 Dependence on wheelchair: Secondary | ICD-10-CM | POA: Diagnosis not present

## 2020-03-26 DIAGNOSIS — Z791 Long term (current) use of non-steroidal anti-inflammatories (NSAID): Secondary | ICD-10-CM | POA: Diagnosis not present

## 2020-03-26 DIAGNOSIS — Z792 Long term (current) use of antibiotics: Secondary | ICD-10-CM | POA: Diagnosis not present

## 2020-03-26 DIAGNOSIS — Z9181 History of falling: Secondary | ICD-10-CM | POA: Diagnosis not present

## 2020-03-26 DIAGNOSIS — G834 Cauda equina syndrome: Secondary | ICD-10-CM | POA: Diagnosis not present

## 2020-03-27 DIAGNOSIS — G834 Cauda equina syndrome: Secondary | ICD-10-CM | POA: Diagnosis not present

## 2020-03-27 DIAGNOSIS — Z791 Long term (current) use of non-steroidal anti-inflammatories (NSAID): Secondary | ICD-10-CM | POA: Diagnosis not present

## 2020-03-27 DIAGNOSIS — Z792 Long term (current) use of antibiotics: Secondary | ICD-10-CM | POA: Diagnosis not present

## 2020-03-27 DIAGNOSIS — G8250 Quadriplegia, unspecified: Secondary | ICD-10-CM | POA: Diagnosis not present

## 2020-03-27 DIAGNOSIS — Z9181 History of falling: Secondary | ICD-10-CM | POA: Diagnosis not present

## 2020-03-27 DIAGNOSIS — Z993 Dependence on wheelchair: Secondary | ICD-10-CM | POA: Diagnosis not present

## 2020-03-27 DIAGNOSIS — Z466 Encounter for fitting and adjustment of urinary device: Secondary | ICD-10-CM | POA: Diagnosis not present

## 2020-03-28 DIAGNOSIS — G8250 Quadriplegia, unspecified: Secondary | ICD-10-CM | POA: Diagnosis not present

## 2020-03-28 DIAGNOSIS — Z791 Long term (current) use of non-steroidal anti-inflammatories (NSAID): Secondary | ICD-10-CM | POA: Diagnosis not present

## 2020-03-28 DIAGNOSIS — Z466 Encounter for fitting and adjustment of urinary device: Secondary | ICD-10-CM | POA: Diagnosis not present

## 2020-03-28 DIAGNOSIS — Z792 Long term (current) use of antibiotics: Secondary | ICD-10-CM | POA: Diagnosis not present

## 2020-03-28 DIAGNOSIS — Z993 Dependence on wheelchair: Secondary | ICD-10-CM | POA: Diagnosis not present

## 2020-03-28 DIAGNOSIS — G834 Cauda equina syndrome: Secondary | ICD-10-CM | POA: Diagnosis not present

## 2020-03-28 DIAGNOSIS — Z9181 History of falling: Secondary | ICD-10-CM | POA: Diagnosis not present

## 2020-03-31 DIAGNOSIS — Z466 Encounter for fitting and adjustment of urinary device: Secondary | ICD-10-CM | POA: Diagnosis not present

## 2020-03-31 DIAGNOSIS — Z9181 History of falling: Secondary | ICD-10-CM | POA: Diagnosis not present

## 2020-03-31 DIAGNOSIS — G8250 Quadriplegia, unspecified: Secondary | ICD-10-CM | POA: Diagnosis not present

## 2020-03-31 DIAGNOSIS — Z792 Long term (current) use of antibiotics: Secondary | ICD-10-CM | POA: Diagnosis not present

## 2020-03-31 DIAGNOSIS — Z791 Long term (current) use of non-steroidal anti-inflammatories (NSAID): Secondary | ICD-10-CM | POA: Diagnosis not present

## 2020-03-31 DIAGNOSIS — G834 Cauda equina syndrome: Secondary | ICD-10-CM | POA: Diagnosis not present

## 2020-03-31 DIAGNOSIS — Z993 Dependence on wheelchair: Secondary | ICD-10-CM | POA: Diagnosis not present

## 2020-04-02 DIAGNOSIS — Z9181 History of falling: Secondary | ICD-10-CM | POA: Diagnosis not present

## 2020-04-02 DIAGNOSIS — Z792 Long term (current) use of antibiotics: Secondary | ICD-10-CM | POA: Diagnosis not present

## 2020-04-02 DIAGNOSIS — G834 Cauda equina syndrome: Secondary | ICD-10-CM | POA: Diagnosis not present

## 2020-04-02 DIAGNOSIS — Z791 Long term (current) use of non-steroidal anti-inflammatories (NSAID): Secondary | ICD-10-CM | POA: Diagnosis not present

## 2020-04-02 DIAGNOSIS — Z466 Encounter for fitting and adjustment of urinary device: Secondary | ICD-10-CM | POA: Diagnosis not present

## 2020-04-02 DIAGNOSIS — Z993 Dependence on wheelchair: Secondary | ICD-10-CM | POA: Diagnosis not present

## 2020-04-02 DIAGNOSIS — G8250 Quadriplegia, unspecified: Secondary | ICD-10-CM | POA: Diagnosis not present

## 2020-04-04 DIAGNOSIS — Z466 Encounter for fitting and adjustment of urinary device: Secondary | ICD-10-CM | POA: Diagnosis not present

## 2020-04-04 DIAGNOSIS — Z792 Long term (current) use of antibiotics: Secondary | ICD-10-CM | POA: Diagnosis not present

## 2020-04-04 DIAGNOSIS — G8250 Quadriplegia, unspecified: Secondary | ICD-10-CM | POA: Diagnosis not present

## 2020-04-04 DIAGNOSIS — Z9181 History of falling: Secondary | ICD-10-CM | POA: Diagnosis not present

## 2020-04-04 DIAGNOSIS — Z791 Long term (current) use of non-steroidal anti-inflammatories (NSAID): Secondary | ICD-10-CM | POA: Diagnosis not present

## 2020-04-04 DIAGNOSIS — Z993 Dependence on wheelchair: Secondary | ICD-10-CM | POA: Diagnosis not present

## 2020-04-04 DIAGNOSIS — G834 Cauda equina syndrome: Secondary | ICD-10-CM | POA: Diagnosis not present

## 2020-04-07 DIAGNOSIS — Z993 Dependence on wheelchair: Secondary | ICD-10-CM | POA: Diagnosis not present

## 2020-04-07 DIAGNOSIS — Z9181 History of falling: Secondary | ICD-10-CM | POA: Diagnosis not present

## 2020-04-07 DIAGNOSIS — Z87440 Personal history of urinary (tract) infections: Secondary | ICD-10-CM | POA: Diagnosis not present

## 2020-04-07 DIAGNOSIS — Z792 Long term (current) use of antibiotics: Secondary | ICD-10-CM | POA: Diagnosis not present

## 2020-04-07 DIAGNOSIS — G834 Cauda equina syndrome: Secondary | ICD-10-CM | POA: Diagnosis not present

## 2020-04-07 DIAGNOSIS — R339 Retention of urine, unspecified: Secondary | ICD-10-CM | POA: Diagnosis not present

## 2020-04-07 DIAGNOSIS — Z466 Encounter for fitting and adjustment of urinary device: Secondary | ICD-10-CM | POA: Diagnosis not present

## 2020-04-07 DIAGNOSIS — Z791 Long term (current) use of non-steroidal anti-inflammatories (NSAID): Secondary | ICD-10-CM | POA: Diagnosis not present

## 2020-04-07 DIAGNOSIS — G8250 Quadriplegia, unspecified: Secondary | ICD-10-CM | POA: Diagnosis not present

## 2020-04-08 DIAGNOSIS — Z9181 History of falling: Secondary | ICD-10-CM | POA: Diagnosis not present

## 2020-04-08 DIAGNOSIS — Z993 Dependence on wheelchair: Secondary | ICD-10-CM | POA: Diagnosis not present

## 2020-04-08 DIAGNOSIS — Z466 Encounter for fitting and adjustment of urinary device: Secondary | ICD-10-CM | POA: Diagnosis not present

## 2020-04-08 DIAGNOSIS — G834 Cauda equina syndrome: Secondary | ICD-10-CM | POA: Diagnosis not present

## 2020-04-08 DIAGNOSIS — Z791 Long term (current) use of non-steroidal anti-inflammatories (NSAID): Secondary | ICD-10-CM | POA: Diagnosis not present

## 2020-04-08 DIAGNOSIS — G8250 Quadriplegia, unspecified: Secondary | ICD-10-CM | POA: Diagnosis not present

## 2020-04-08 DIAGNOSIS — Z792 Long term (current) use of antibiotics: Secondary | ICD-10-CM | POA: Diagnosis not present

## 2020-04-09 ENCOUNTER — Other Ambulatory Visit: Payer: Self-pay | Admitting: *Deleted

## 2020-04-09 DIAGNOSIS — Z466 Encounter for fitting and adjustment of urinary device: Secondary | ICD-10-CM | POA: Diagnosis not present

## 2020-04-09 DIAGNOSIS — Z791 Long term (current) use of non-steroidal anti-inflammatories (NSAID): Secondary | ICD-10-CM | POA: Diagnosis not present

## 2020-04-09 DIAGNOSIS — G834 Cauda equina syndrome: Secondary | ICD-10-CM | POA: Diagnosis not present

## 2020-04-09 DIAGNOSIS — Z9181 History of falling: Secondary | ICD-10-CM | POA: Diagnosis not present

## 2020-04-09 DIAGNOSIS — Z993 Dependence on wheelchair: Secondary | ICD-10-CM | POA: Diagnosis not present

## 2020-04-09 DIAGNOSIS — G8250 Quadriplegia, unspecified: Secondary | ICD-10-CM | POA: Diagnosis not present

## 2020-04-09 DIAGNOSIS — Z792 Long term (current) use of antibiotics: Secondary | ICD-10-CM | POA: Diagnosis not present

## 2020-04-09 MED ORDER — NITROFURANTOIN MACROCRYSTAL 50 MG PO CAPS: 50.0000 mg | ORAL_CAPSULE | Freq: Two times a day (BID) | ORAL | 3 refills | Status: DC

## 2020-04-11 DIAGNOSIS — G8250 Quadriplegia, unspecified: Secondary | ICD-10-CM | POA: Diagnosis not present

## 2020-04-11 DIAGNOSIS — Z791 Long term (current) use of non-steroidal anti-inflammatories (NSAID): Secondary | ICD-10-CM | POA: Diagnosis not present

## 2020-04-11 DIAGNOSIS — G834 Cauda equina syndrome: Secondary | ICD-10-CM | POA: Diagnosis not present

## 2020-04-11 DIAGNOSIS — Z9181 History of falling: Secondary | ICD-10-CM | POA: Diagnosis not present

## 2020-04-11 DIAGNOSIS — Z792 Long term (current) use of antibiotics: Secondary | ICD-10-CM | POA: Diagnosis not present

## 2020-04-11 DIAGNOSIS — Z993 Dependence on wheelchair: Secondary | ICD-10-CM | POA: Diagnosis not present

## 2020-04-11 DIAGNOSIS — Z466 Encounter for fitting and adjustment of urinary device: Secondary | ICD-10-CM | POA: Diagnosis not present

## 2020-04-14 DIAGNOSIS — G8250 Quadriplegia, unspecified: Secondary | ICD-10-CM | POA: Diagnosis not present

## 2020-04-14 DIAGNOSIS — Z791 Long term (current) use of non-steroidal anti-inflammatories (NSAID): Secondary | ICD-10-CM | POA: Diagnosis not present

## 2020-04-14 DIAGNOSIS — Z792 Long term (current) use of antibiotics: Secondary | ICD-10-CM | POA: Diagnosis not present

## 2020-04-14 DIAGNOSIS — Z9181 History of falling: Secondary | ICD-10-CM | POA: Diagnosis not present

## 2020-04-14 DIAGNOSIS — G834 Cauda equina syndrome: Secondary | ICD-10-CM | POA: Diagnosis not present

## 2020-04-14 DIAGNOSIS — Z993 Dependence on wheelchair: Secondary | ICD-10-CM | POA: Diagnosis not present

## 2020-04-14 DIAGNOSIS — Z466 Encounter for fitting and adjustment of urinary device: Secondary | ICD-10-CM | POA: Diagnosis not present

## 2020-04-16 DIAGNOSIS — Z993 Dependence on wheelchair: Secondary | ICD-10-CM | POA: Diagnosis not present

## 2020-04-16 DIAGNOSIS — Z791 Long term (current) use of non-steroidal anti-inflammatories (NSAID): Secondary | ICD-10-CM | POA: Diagnosis not present

## 2020-04-16 DIAGNOSIS — Z9181 History of falling: Secondary | ICD-10-CM | POA: Diagnosis not present

## 2020-04-16 DIAGNOSIS — Z466 Encounter for fitting and adjustment of urinary device: Secondary | ICD-10-CM | POA: Diagnosis not present

## 2020-04-16 DIAGNOSIS — G834 Cauda equina syndrome: Secondary | ICD-10-CM | POA: Diagnosis not present

## 2020-04-16 DIAGNOSIS — Z792 Long term (current) use of antibiotics: Secondary | ICD-10-CM | POA: Diagnosis not present

## 2020-04-16 DIAGNOSIS — G8250 Quadriplegia, unspecified: Secondary | ICD-10-CM | POA: Diagnosis not present

## 2020-04-18 DIAGNOSIS — G8250 Quadriplegia, unspecified: Secondary | ICD-10-CM | POA: Diagnosis not present

## 2020-04-18 DIAGNOSIS — Z466 Encounter for fitting and adjustment of urinary device: Secondary | ICD-10-CM | POA: Diagnosis not present

## 2020-04-18 DIAGNOSIS — Z993 Dependence on wheelchair: Secondary | ICD-10-CM | POA: Diagnosis not present

## 2020-04-18 DIAGNOSIS — Z9181 History of falling: Secondary | ICD-10-CM | POA: Diagnosis not present

## 2020-04-18 DIAGNOSIS — Z791 Long term (current) use of non-steroidal anti-inflammatories (NSAID): Secondary | ICD-10-CM | POA: Diagnosis not present

## 2020-04-18 DIAGNOSIS — Z792 Long term (current) use of antibiotics: Secondary | ICD-10-CM | POA: Diagnosis not present

## 2020-04-18 DIAGNOSIS — G834 Cauda equina syndrome: Secondary | ICD-10-CM | POA: Diagnosis not present

## 2020-04-21 DIAGNOSIS — G8250 Quadriplegia, unspecified: Secondary | ICD-10-CM | POA: Diagnosis not present

## 2020-04-21 DIAGNOSIS — Z792 Long term (current) use of antibiotics: Secondary | ICD-10-CM | POA: Diagnosis not present

## 2020-04-21 DIAGNOSIS — Z9181 History of falling: Secondary | ICD-10-CM | POA: Diagnosis not present

## 2020-04-21 DIAGNOSIS — Z791 Long term (current) use of non-steroidal anti-inflammatories (NSAID): Secondary | ICD-10-CM | POA: Diagnosis not present

## 2020-04-21 DIAGNOSIS — Z993 Dependence on wheelchair: Secondary | ICD-10-CM | POA: Diagnosis not present

## 2020-04-21 DIAGNOSIS — Z466 Encounter for fitting and adjustment of urinary device: Secondary | ICD-10-CM | POA: Diagnosis not present

## 2020-04-21 DIAGNOSIS — G834 Cauda equina syndrome: Secondary | ICD-10-CM | POA: Diagnosis not present

## 2020-04-23 DIAGNOSIS — Z9181 History of falling: Secondary | ICD-10-CM | POA: Diagnosis not present

## 2020-04-23 DIAGNOSIS — G8250 Quadriplegia, unspecified: Secondary | ICD-10-CM | POA: Diagnosis not present

## 2020-04-23 DIAGNOSIS — Z993 Dependence on wheelchair: Secondary | ICD-10-CM | POA: Diagnosis not present

## 2020-04-23 DIAGNOSIS — Z791 Long term (current) use of non-steroidal anti-inflammatories (NSAID): Secondary | ICD-10-CM | POA: Diagnosis not present

## 2020-04-23 DIAGNOSIS — Z792 Long term (current) use of antibiotics: Secondary | ICD-10-CM | POA: Diagnosis not present

## 2020-04-23 DIAGNOSIS — Z466 Encounter for fitting and adjustment of urinary device: Secondary | ICD-10-CM | POA: Diagnosis not present

## 2020-04-23 DIAGNOSIS — G834 Cauda equina syndrome: Secondary | ICD-10-CM | POA: Diagnosis not present

## 2020-04-25 DIAGNOSIS — Z993 Dependence on wheelchair: Secondary | ICD-10-CM | POA: Diagnosis not present

## 2020-04-25 DIAGNOSIS — Z9181 History of falling: Secondary | ICD-10-CM | POA: Diagnosis not present

## 2020-04-25 DIAGNOSIS — Z466 Encounter for fitting and adjustment of urinary device: Secondary | ICD-10-CM | POA: Diagnosis not present

## 2020-04-25 DIAGNOSIS — Z791 Long term (current) use of non-steroidal anti-inflammatories (NSAID): Secondary | ICD-10-CM | POA: Diagnosis not present

## 2020-04-25 DIAGNOSIS — G834 Cauda equina syndrome: Secondary | ICD-10-CM | POA: Diagnosis not present

## 2020-04-25 DIAGNOSIS — G8250 Quadriplegia, unspecified: Secondary | ICD-10-CM | POA: Diagnosis not present

## 2020-04-25 DIAGNOSIS — Z792 Long term (current) use of antibiotics: Secondary | ICD-10-CM | POA: Diagnosis not present

## 2020-04-28 DIAGNOSIS — Z792 Long term (current) use of antibiotics: Secondary | ICD-10-CM | POA: Diagnosis not present

## 2020-04-28 DIAGNOSIS — Z791 Long term (current) use of non-steroidal anti-inflammatories (NSAID): Secondary | ICD-10-CM | POA: Diagnosis not present

## 2020-04-28 DIAGNOSIS — Z993 Dependence on wheelchair: Secondary | ICD-10-CM | POA: Diagnosis not present

## 2020-04-28 DIAGNOSIS — G834 Cauda equina syndrome: Secondary | ICD-10-CM | POA: Diagnosis not present

## 2020-04-28 DIAGNOSIS — Z9181 History of falling: Secondary | ICD-10-CM | POA: Diagnosis not present

## 2020-04-28 DIAGNOSIS — Z466 Encounter for fitting and adjustment of urinary device: Secondary | ICD-10-CM | POA: Diagnosis not present

## 2020-04-28 DIAGNOSIS — G8250 Quadriplegia, unspecified: Secondary | ICD-10-CM | POA: Diagnosis not present

## 2020-04-29 DIAGNOSIS — Z466 Encounter for fitting and adjustment of urinary device: Secondary | ICD-10-CM | POA: Diagnosis not present

## 2020-04-29 DIAGNOSIS — Z9181 History of falling: Secondary | ICD-10-CM | POA: Diagnosis not present

## 2020-04-29 DIAGNOSIS — Z993 Dependence on wheelchair: Secondary | ICD-10-CM | POA: Diagnosis not present

## 2020-04-29 DIAGNOSIS — Z792 Long term (current) use of antibiotics: Secondary | ICD-10-CM | POA: Diagnosis not present

## 2020-04-29 DIAGNOSIS — G834 Cauda equina syndrome: Secondary | ICD-10-CM | POA: Diagnosis not present

## 2020-04-29 DIAGNOSIS — Z791 Long term (current) use of non-steroidal anti-inflammatories (NSAID): Secondary | ICD-10-CM | POA: Diagnosis not present

## 2020-04-29 DIAGNOSIS — G8250 Quadriplegia, unspecified: Secondary | ICD-10-CM | POA: Diagnosis not present

## 2020-04-30 ENCOUNTER — Ambulatory Visit: Payer: Medicare Other | Admitting: Internal Medicine

## 2020-04-30 DIAGNOSIS — Z466 Encounter for fitting and adjustment of urinary device: Secondary | ICD-10-CM | POA: Diagnosis not present

## 2020-04-30 DIAGNOSIS — Z993 Dependence on wheelchair: Secondary | ICD-10-CM | POA: Diagnosis not present

## 2020-04-30 DIAGNOSIS — Z9181 History of falling: Secondary | ICD-10-CM | POA: Diagnosis not present

## 2020-04-30 DIAGNOSIS — Z791 Long term (current) use of non-steroidal anti-inflammatories (NSAID): Secondary | ICD-10-CM | POA: Diagnosis not present

## 2020-04-30 DIAGNOSIS — Z792 Long term (current) use of antibiotics: Secondary | ICD-10-CM | POA: Diagnosis not present

## 2020-04-30 DIAGNOSIS — G8250 Quadriplegia, unspecified: Secondary | ICD-10-CM | POA: Diagnosis not present

## 2020-04-30 DIAGNOSIS — G834 Cauda equina syndrome: Secondary | ICD-10-CM | POA: Diagnosis not present

## 2020-05-01 DIAGNOSIS — R339 Retention of urine, unspecified: Secondary | ICD-10-CM | POA: Diagnosis not present

## 2020-05-02 DIAGNOSIS — Z791 Long term (current) use of non-steroidal anti-inflammatories (NSAID): Secondary | ICD-10-CM | POA: Diagnosis not present

## 2020-05-02 DIAGNOSIS — Z9181 History of falling: Secondary | ICD-10-CM | POA: Diagnosis not present

## 2020-05-02 DIAGNOSIS — G834 Cauda equina syndrome: Secondary | ICD-10-CM | POA: Diagnosis not present

## 2020-05-02 DIAGNOSIS — Z466 Encounter for fitting and adjustment of urinary device: Secondary | ICD-10-CM | POA: Diagnosis not present

## 2020-05-02 DIAGNOSIS — R339 Retention of urine, unspecified: Secondary | ICD-10-CM | POA: Diagnosis not present

## 2020-05-02 DIAGNOSIS — Z993 Dependence on wheelchair: Secondary | ICD-10-CM | POA: Diagnosis not present

## 2020-05-02 DIAGNOSIS — G8250 Quadriplegia, unspecified: Secondary | ICD-10-CM | POA: Diagnosis not present

## 2020-05-02 DIAGNOSIS — Z792 Long term (current) use of antibiotics: Secondary | ICD-10-CM | POA: Diagnosis not present

## 2020-05-05 DIAGNOSIS — G834 Cauda equina syndrome: Secondary | ICD-10-CM | POA: Diagnosis not present

## 2020-05-05 DIAGNOSIS — Z87440 Personal history of urinary (tract) infections: Secondary | ICD-10-CM | POA: Diagnosis not present

## 2020-05-05 DIAGNOSIS — Z9181 History of falling: Secondary | ICD-10-CM | POA: Diagnosis not present

## 2020-05-05 DIAGNOSIS — Z993 Dependence on wheelchair: Secondary | ICD-10-CM | POA: Diagnosis not present

## 2020-05-05 DIAGNOSIS — Z792 Long term (current) use of antibiotics: Secondary | ICD-10-CM | POA: Diagnosis not present

## 2020-05-05 DIAGNOSIS — R339 Retention of urine, unspecified: Secondary | ICD-10-CM | POA: Diagnosis not present

## 2020-05-05 DIAGNOSIS — G8250 Quadriplegia, unspecified: Secondary | ICD-10-CM | POA: Diagnosis not present

## 2020-05-05 DIAGNOSIS — Z466 Encounter for fitting and adjustment of urinary device: Secondary | ICD-10-CM | POA: Diagnosis not present

## 2020-05-05 DIAGNOSIS — Z791 Long term (current) use of non-steroidal anti-inflammatories (NSAID): Secondary | ICD-10-CM | POA: Diagnosis not present

## 2020-05-06 DIAGNOSIS — Z993 Dependence on wheelchair: Secondary | ICD-10-CM | POA: Diagnosis not present

## 2020-05-06 DIAGNOSIS — Z791 Long term (current) use of non-steroidal anti-inflammatories (NSAID): Secondary | ICD-10-CM | POA: Diagnosis not present

## 2020-05-06 DIAGNOSIS — Z466 Encounter for fitting and adjustment of urinary device: Secondary | ICD-10-CM | POA: Diagnosis not present

## 2020-05-06 DIAGNOSIS — Z792 Long term (current) use of antibiotics: Secondary | ICD-10-CM | POA: Diagnosis not present

## 2020-05-06 DIAGNOSIS — G8250 Quadriplegia, unspecified: Secondary | ICD-10-CM | POA: Diagnosis not present

## 2020-05-06 DIAGNOSIS — G834 Cauda equina syndrome: Secondary | ICD-10-CM | POA: Diagnosis not present

## 2020-05-06 DIAGNOSIS — Z9181 History of falling: Secondary | ICD-10-CM | POA: Diagnosis not present

## 2020-05-07 DIAGNOSIS — Z466 Encounter for fitting and adjustment of urinary device: Secondary | ICD-10-CM | POA: Diagnosis not present

## 2020-05-07 DIAGNOSIS — Z9181 History of falling: Secondary | ICD-10-CM | POA: Diagnosis not present

## 2020-05-07 DIAGNOSIS — G834 Cauda equina syndrome: Secondary | ICD-10-CM | POA: Diagnosis not present

## 2020-05-07 DIAGNOSIS — R339 Retention of urine, unspecified: Secondary | ICD-10-CM | POA: Diagnosis not present

## 2020-05-07 DIAGNOSIS — Z87440 Personal history of urinary (tract) infections: Secondary | ICD-10-CM | POA: Diagnosis not present

## 2020-05-07 DIAGNOSIS — Z993 Dependence on wheelchair: Secondary | ICD-10-CM | POA: Diagnosis not present

## 2020-05-07 DIAGNOSIS — Z792 Long term (current) use of antibiotics: Secondary | ICD-10-CM | POA: Diagnosis not present

## 2020-05-07 DIAGNOSIS — G8250 Quadriplegia, unspecified: Secondary | ICD-10-CM | POA: Diagnosis not present

## 2020-05-07 DIAGNOSIS — Z791 Long term (current) use of non-steroidal anti-inflammatories (NSAID): Secondary | ICD-10-CM | POA: Diagnosis not present

## 2020-05-09 DIAGNOSIS — Z466 Encounter for fitting and adjustment of urinary device: Secondary | ICD-10-CM | POA: Diagnosis not present

## 2020-05-09 DIAGNOSIS — Z792 Long term (current) use of antibiotics: Secondary | ICD-10-CM | POA: Diagnosis not present

## 2020-05-09 DIAGNOSIS — Z993 Dependence on wheelchair: Secondary | ICD-10-CM | POA: Diagnosis not present

## 2020-05-09 DIAGNOSIS — G834 Cauda equina syndrome: Secondary | ICD-10-CM | POA: Diagnosis not present

## 2020-05-09 DIAGNOSIS — Z791 Long term (current) use of non-steroidal anti-inflammatories (NSAID): Secondary | ICD-10-CM | POA: Diagnosis not present

## 2020-05-09 DIAGNOSIS — Z9181 History of falling: Secondary | ICD-10-CM | POA: Diagnosis not present

## 2020-05-09 DIAGNOSIS — G8250 Quadriplegia, unspecified: Secondary | ICD-10-CM | POA: Diagnosis not present

## 2020-05-12 ENCOUNTER — Encounter: Payer: Self-pay | Admitting: Internal Medicine

## 2020-05-12 ENCOUNTER — Other Ambulatory Visit: Payer: Self-pay

## 2020-05-12 ENCOUNTER — Ambulatory Visit (INDEPENDENT_AMBULATORY_CARE_PROVIDER_SITE_OTHER): Payer: Medicare Other | Admitting: Internal Medicine

## 2020-05-12 VITALS — BP 124/84 | HR 69 | Ht 65.0 in

## 2020-05-12 DIAGNOSIS — Z23 Encounter for immunization: Secondary | ICD-10-CM | POA: Diagnosis not present

## 2020-05-12 DIAGNOSIS — Z792 Long term (current) use of antibiotics: Secondary | ICD-10-CM | POA: Diagnosis not present

## 2020-05-12 DIAGNOSIS — R32 Unspecified urinary incontinence: Secondary | ICD-10-CM | POA: Diagnosis not present

## 2020-05-12 DIAGNOSIS — G8250 Quadriplegia, unspecified: Secondary | ICD-10-CM

## 2020-05-12 DIAGNOSIS — G834 Cauda equina syndrome: Secondary | ICD-10-CM | POA: Diagnosis not present

## 2020-05-12 DIAGNOSIS — Z9181 History of falling: Secondary | ICD-10-CM | POA: Diagnosis not present

## 2020-05-12 DIAGNOSIS — Z466 Encounter for fitting and adjustment of urinary device: Secondary | ICD-10-CM | POA: Diagnosis not present

## 2020-05-12 DIAGNOSIS — Z791 Long term (current) use of non-steroidal anti-inflammatories (NSAID): Secondary | ICD-10-CM | POA: Diagnosis not present

## 2020-05-12 DIAGNOSIS — Z993 Dependence on wheelchair: Secondary | ICD-10-CM

## 2020-05-12 NOTE — Assessment & Plan Note (Signed)
Patient has no problem with handling of her wheelchair.

## 2020-05-12 NOTE — Assessment & Plan Note (Signed)
Suggest Covid shot.

## 2020-05-12 NOTE — Assessment & Plan Note (Signed)
Patient is wheelchair-bound.  She does not have any bedsores or skin lesion.  She denies any history of fever and chills.  She denies any chest pain chest is clear.  Pulmonary examination revealed no tachypnea, no respiratory distress, breath sounds are normal, no rales or rhonchi are noted, abdomen is soft nontender.

## 2020-05-12 NOTE — Progress Notes (Signed)
Established Patient Office Visit  Subjective:  Patient ID: Heidi Hess, female    DOB: 09-10-1960  Age: 60 y.o. MRN: [id number]  CC:  Chief Complaint  Patient presents with  . Follow-up    HPI  ROSHONDA SPERL presents for for checkup Patient is known to have quadriplegia with quadriparesis has an incontinence problem she is wheelchair dependent and came for regular checkup.  She is doing well not depressed she is taking her medications as directed.  She does not smoke.  History reviewed. No pertinent past medical history.  History reviewed. No pertinent surgical history.  Family History  Problem Relation Age of Onset  . Breast cancer Paternal Aunt   . Breast cancer Cousin        maternal    Social History   Socioeconomic History  . Marital status: Single    Spouse name: Not on file  . Number of children: Not on file  . Years of education: Not on file  . Highest education level: Not on file  Occupational History  . Not on file  Tobacco Use  . Smoking status: Former Smoker    Quit date: 2010    Years since quitting: 12.1  . Smokeless tobacco: Never Used  Substance and Sexual Activity  . Alcohol use: Not on file  . Drug use: Not on file  . Sexual activity: Not on file  Other Topics Concern  . Not on file  Social History Narrative  . Not on file   Social Determinants of Health   Financial Resource Strain: Not on file  Food Insecurity: Not on file  Transportation Needs: Not on file  Physical Activity: Not on file  Stress: Not on file  Social Connections: Not on file  Intimate Partner Violence: Not on file     Current Outpatient Medications:  .  Ascorbic Acid (VITAMIN C) 500 MG CHEW, Chew 500 mg by mouth daily., Disp: , Rfl:  .  baclofen (LIORESAL) 10 MG tablet, Take 1 tablet (10 mg total) by mouth 2 (two) times daily., Disp: 60 each, Rfl: 2 .  baclofen (LIORESAL) 10 MG tablet, Take 1 tablet (10 mg total) by mouth 3 (three) times daily., Disp: 30  each, Rfl: 6 .  bisacodyl (DULCOLAX) 10 MG suppository, Place 10 mg rectally daily., Disp: , Rfl:  .  fluconazole (DIFLUCAN) 150 MG tablet, Take 1 tablet (150 mg total) by mouth daily., Disp: 30 tablet, Rfl: 3 .  loratadine (CLARITIN) 10 MG tablet, Take 10 mg by mouth daily., Disp: , Rfl:  .  Melatonin 1 MG CAPS, Take 1 capsule by mouth at bedtime as needed., Disp: , Rfl:  .  nitrofurantoin (MACRODANTIN) 50 MG capsule, Take 1 capsule (50 mg total) by mouth 2 (two) times daily., Disp: 180 capsule, Rfl: 3 .  nystatin ointment (MYCOSTATIN), Apply 30 g topically daily., Disp: , Rfl:  .  oxybutynin (DITROPAN XL) 15 MG 24 hr tablet, Take 1 tablet (15 mg total) by mouth daily., Disp: 90 tablet, Rfl: 6 .  Simethicone 180 MG CAPS, Take 180 mg by mouth daily., Disp: , Rfl:    Not on File  ROS Review of Systems  Constitutional: Positive for fatigue. Negative for appetite change, chills, diaphoresis and unexpected weight change.  HENT: Negative for drooling.   Respiratory: Negative for chest tightness.   Cardiovascular: Negative for chest pain.  Gastrointestinal: Positive for constipation. Negative for abdominal distention, blood in stool and diarrhea.  Endocrine: Negative for cold intolerance.  Genitourinary: Negative for flank pain and pelvic pain.  Musculoskeletal: Positive for gait problem (She is wheelchair-bound).      Objective:    Physical Exam  BP 124/84   Pulse 69   Ht 5\' 5"  (1.651 m)   BMI 28.29 kg/m  Wt Readings from Last 3 Encounters:  12/19/19 170 lb (77.1 kg)     Health Maintenance Due  Topic Date Due  . Hepatitis C Screening  Never done  . HIV Screening  Never done  . TETANUS/TDAP  Never done  . PAP SMEAR-Modifier  Never done  . COLONOSCOPY (Pts 45-60yrs Insurance coverage will need to be confirmed)  Never done    There are no preventive care reminders to display for this patient.  Lab Results  Component Value Date   TSH 1.64 12/19/2019   Lab Results   Component Value Date   WBC 5.5 12/19/2019   HGB 13.8 12/19/2019   HCT 41.9 12/19/2019   MCV 89.9 12/19/2019   PLT 203 12/19/2019   Lab Results  Component Value Date   NA 137 12/19/2019   K 3.8 12/19/2019   CO2 25 12/19/2019   GLUCOSE 84 12/19/2019   BUN 16 12/19/2019   CREATININE 0.47 (L) 12/19/2019   BILITOT 0.5 12/19/2019   AST 16 12/19/2019   ALT 11 12/19/2019   PROT 7.1 12/19/2019   CALCIUM 9.0 12/19/2019   Lab Results  Component Value Date   CHOL 189 12/19/2019   Lab Results  Component Value Date   HDL 42 (L) 12/19/2019   Lab Results  Component Value Date   LDLCALC 126 (H) 12/19/2019   Lab Results  Component Value Date   TRIG 104 12/19/2019   Lab Results  Component Value Date   CHOLHDL 4.5 12/19/2019   No results found for: HGBA1C    Assessment & Plan:   Problem List Items Addressed This Visit      Nervous and Auditory   Quadriplegia and quadriparesis ([place])    Patient is wheelchair-bound.  She does not have any bedsores or skin lesion.  She denies any history of fever and chills.  She denies any chest pain chest is clear.  Pulmonary examination revealed no tachypnea, no respiratory distress, breath sounds are normal, no rales or rhonchi are noted, abdomen is soft nontender.        Other   Incontinence in female    Patient is dependent on the catheter because she is incontinent.  The urine looks okay without any sediment or infection      Wheelchair dependence    Patient has no problem with handling of her wheelchair.      Need for COVID-19 vaccine - Primary    Suggest Covid shot.      Relevant Orders   Pfizer SARS-COV-2 Vaccine (Completed)      No orders of the defined types were placed in this encounter.   Follow-up: No follow-ups on file.    [name], MD

## 2020-05-12 NOTE — Assessment & Plan Note (Signed)
Patient is dependent on the catheter because she is incontinent.  The urine looks okay without any sediment or infection

## 2020-05-14 DIAGNOSIS — G834 Cauda equina syndrome: Secondary | ICD-10-CM | POA: Diagnosis not present

## 2020-05-14 DIAGNOSIS — Z792 Long term (current) use of antibiotics: Secondary | ICD-10-CM | POA: Diagnosis not present

## 2020-05-14 DIAGNOSIS — Z993 Dependence on wheelchair: Secondary | ICD-10-CM | POA: Diagnosis not present

## 2020-05-14 DIAGNOSIS — Z791 Long term (current) use of non-steroidal anti-inflammatories (NSAID): Secondary | ICD-10-CM | POA: Diagnosis not present

## 2020-05-14 DIAGNOSIS — G8250 Quadriplegia, unspecified: Secondary | ICD-10-CM | POA: Diagnosis not present

## 2020-05-14 DIAGNOSIS — Z466 Encounter for fitting and adjustment of urinary device: Secondary | ICD-10-CM | POA: Diagnosis not present

## 2020-05-14 DIAGNOSIS — Z9181 History of falling: Secondary | ICD-10-CM | POA: Diagnosis not present

## 2020-05-15 DIAGNOSIS — Z792 Long term (current) use of antibiotics: Secondary | ICD-10-CM | POA: Diagnosis not present

## 2020-05-15 DIAGNOSIS — Z993 Dependence on wheelchair: Secondary | ICD-10-CM | POA: Diagnosis not present

## 2020-05-15 DIAGNOSIS — Z466 Encounter for fitting and adjustment of urinary device: Secondary | ICD-10-CM | POA: Diagnosis not present

## 2020-05-15 DIAGNOSIS — G834 Cauda equina syndrome: Secondary | ICD-10-CM | POA: Diagnosis not present

## 2020-05-15 DIAGNOSIS — Z791 Long term (current) use of non-steroidal anti-inflammatories (NSAID): Secondary | ICD-10-CM | POA: Diagnosis not present

## 2020-05-15 DIAGNOSIS — Z9181 History of falling: Secondary | ICD-10-CM | POA: Diagnosis not present

## 2020-05-15 DIAGNOSIS — G8250 Quadriplegia, unspecified: Secondary | ICD-10-CM | POA: Diagnosis not present

## 2020-05-16 DIAGNOSIS — Z791 Long term (current) use of non-steroidal anti-inflammatories (NSAID): Secondary | ICD-10-CM | POA: Diagnosis not present

## 2020-05-16 DIAGNOSIS — Z466 Encounter for fitting and adjustment of urinary device: Secondary | ICD-10-CM | POA: Diagnosis not present

## 2020-05-16 DIAGNOSIS — Z792 Long term (current) use of antibiotics: Secondary | ICD-10-CM | POA: Diagnosis not present

## 2020-05-16 DIAGNOSIS — G8250 Quadriplegia, unspecified: Secondary | ICD-10-CM | POA: Diagnosis not present

## 2020-05-16 DIAGNOSIS — G834 Cauda equina syndrome: Secondary | ICD-10-CM | POA: Diagnosis not present

## 2020-05-16 DIAGNOSIS — Z993 Dependence on wheelchair: Secondary | ICD-10-CM | POA: Diagnosis not present

## 2020-05-16 DIAGNOSIS — Z9181 History of falling: Secondary | ICD-10-CM | POA: Diagnosis not present

## 2020-05-19 DIAGNOSIS — Z791 Long term (current) use of non-steroidal anti-inflammatories (NSAID): Secondary | ICD-10-CM | POA: Diagnosis not present

## 2020-05-19 DIAGNOSIS — Z466 Encounter for fitting and adjustment of urinary device: Secondary | ICD-10-CM | POA: Diagnosis not present

## 2020-05-19 DIAGNOSIS — G834 Cauda equina syndrome: Secondary | ICD-10-CM | POA: Diagnosis not present

## 2020-05-19 DIAGNOSIS — Z993 Dependence on wheelchair: Secondary | ICD-10-CM | POA: Diagnosis not present

## 2020-05-19 DIAGNOSIS — G8250 Quadriplegia, unspecified: Secondary | ICD-10-CM | POA: Diagnosis not present

## 2020-05-19 DIAGNOSIS — Z792 Long term (current) use of antibiotics: Secondary | ICD-10-CM | POA: Diagnosis not present

## 2020-05-19 DIAGNOSIS — Z9181 History of falling: Secondary | ICD-10-CM | POA: Diagnosis not present

## 2020-05-20 DIAGNOSIS — G834 Cauda equina syndrome: Secondary | ICD-10-CM | POA: Diagnosis not present

## 2020-05-20 DIAGNOSIS — G8250 Quadriplegia, unspecified: Secondary | ICD-10-CM | POA: Diagnosis not present

## 2020-05-20 DIAGNOSIS — Z792 Long term (current) use of antibiotics: Secondary | ICD-10-CM | POA: Diagnosis not present

## 2020-05-20 DIAGNOSIS — Z466 Encounter for fitting and adjustment of urinary device: Secondary | ICD-10-CM | POA: Diagnosis not present

## 2020-05-20 DIAGNOSIS — Z993 Dependence on wheelchair: Secondary | ICD-10-CM | POA: Diagnosis not present

## 2020-05-20 DIAGNOSIS — Z9181 History of falling: Secondary | ICD-10-CM | POA: Diagnosis not present

## 2020-05-20 DIAGNOSIS — Z791 Long term (current) use of non-steroidal anti-inflammatories (NSAID): Secondary | ICD-10-CM | POA: Diagnosis not present

## 2020-05-21 DIAGNOSIS — G834 Cauda equina syndrome: Secondary | ICD-10-CM | POA: Diagnosis not present

## 2020-05-21 DIAGNOSIS — Z466 Encounter for fitting and adjustment of urinary device: Secondary | ICD-10-CM | POA: Diagnosis not present

## 2020-05-21 DIAGNOSIS — Z791 Long term (current) use of non-steroidal anti-inflammatories (NSAID): Secondary | ICD-10-CM | POA: Diagnosis not present

## 2020-05-21 DIAGNOSIS — Z9181 History of falling: Secondary | ICD-10-CM | POA: Diagnosis not present

## 2020-05-21 DIAGNOSIS — Z993 Dependence on wheelchair: Secondary | ICD-10-CM | POA: Diagnosis not present

## 2020-05-21 DIAGNOSIS — G8250 Quadriplegia, unspecified: Secondary | ICD-10-CM | POA: Diagnosis not present

## 2020-05-21 DIAGNOSIS — Z792 Long term (current) use of antibiotics: Secondary | ICD-10-CM | POA: Diagnosis not present

## 2020-05-23 DIAGNOSIS — G8250 Quadriplegia, unspecified: Secondary | ICD-10-CM | POA: Diagnosis not present

## 2020-05-23 DIAGNOSIS — Z466 Encounter for fitting and adjustment of urinary device: Secondary | ICD-10-CM | POA: Diagnosis not present

## 2020-05-23 DIAGNOSIS — G834 Cauda equina syndrome: Secondary | ICD-10-CM | POA: Diagnosis not present

## 2020-05-23 DIAGNOSIS — Z9181 History of falling: Secondary | ICD-10-CM | POA: Diagnosis not present

## 2020-05-23 DIAGNOSIS — Z993 Dependence on wheelchair: Secondary | ICD-10-CM | POA: Diagnosis not present

## 2020-05-23 DIAGNOSIS — Z792 Long term (current) use of antibiotics: Secondary | ICD-10-CM | POA: Diagnosis not present

## 2020-05-23 DIAGNOSIS — Z791 Long term (current) use of non-steroidal anti-inflammatories (NSAID): Secondary | ICD-10-CM | POA: Diagnosis not present

## 2020-05-26 DIAGNOSIS — Z9181 History of falling: Secondary | ICD-10-CM | POA: Diagnosis not present

## 2020-05-26 DIAGNOSIS — Z791 Long term (current) use of non-steroidal anti-inflammatories (NSAID): Secondary | ICD-10-CM | POA: Diagnosis not present

## 2020-05-26 DIAGNOSIS — G834 Cauda equina syndrome: Secondary | ICD-10-CM | POA: Diagnosis not present

## 2020-05-26 DIAGNOSIS — Z792 Long term (current) use of antibiotics: Secondary | ICD-10-CM | POA: Diagnosis not present

## 2020-05-26 DIAGNOSIS — Z993 Dependence on wheelchair: Secondary | ICD-10-CM | POA: Diagnosis not present

## 2020-05-26 DIAGNOSIS — Z466 Encounter for fitting and adjustment of urinary device: Secondary | ICD-10-CM | POA: Diagnosis not present

## 2020-05-26 DIAGNOSIS — G8250 Quadriplegia, unspecified: Secondary | ICD-10-CM | POA: Diagnosis not present

## 2020-05-27 DIAGNOSIS — G8250 Quadriplegia, unspecified: Secondary | ICD-10-CM | POA: Diagnosis not present

## 2020-05-27 DIAGNOSIS — Z9181 History of falling: Secondary | ICD-10-CM | POA: Diagnosis not present

## 2020-05-27 DIAGNOSIS — Z993 Dependence on wheelchair: Secondary | ICD-10-CM | POA: Diagnosis not present

## 2020-05-27 DIAGNOSIS — Z791 Long term (current) use of non-steroidal anti-inflammatories (NSAID): Secondary | ICD-10-CM | POA: Diagnosis not present

## 2020-05-27 DIAGNOSIS — Z792 Long term (current) use of antibiotics: Secondary | ICD-10-CM | POA: Diagnosis not present

## 2020-05-27 DIAGNOSIS — Z466 Encounter for fitting and adjustment of urinary device: Secondary | ICD-10-CM | POA: Diagnosis not present

## 2020-05-27 DIAGNOSIS — G834 Cauda equina syndrome: Secondary | ICD-10-CM | POA: Diagnosis not present

## 2020-05-28 DIAGNOSIS — Z993 Dependence on wheelchair: Secondary | ICD-10-CM | POA: Diagnosis not present

## 2020-05-28 DIAGNOSIS — G8250 Quadriplegia, unspecified: Secondary | ICD-10-CM | POA: Diagnosis not present

## 2020-05-28 DIAGNOSIS — Z466 Encounter for fitting and adjustment of urinary device: Secondary | ICD-10-CM | POA: Diagnosis not present

## 2020-05-28 DIAGNOSIS — Z791 Long term (current) use of non-steroidal anti-inflammatories (NSAID): Secondary | ICD-10-CM | POA: Diagnosis not present

## 2020-05-28 DIAGNOSIS — Z792 Long term (current) use of antibiotics: Secondary | ICD-10-CM | POA: Diagnosis not present

## 2020-05-28 DIAGNOSIS — G834 Cauda equina syndrome: Secondary | ICD-10-CM | POA: Diagnosis not present

## 2020-05-28 DIAGNOSIS — Z9181 History of falling: Secondary | ICD-10-CM | POA: Diagnosis not present

## 2020-05-30 DIAGNOSIS — G8250 Quadriplegia, unspecified: Secondary | ICD-10-CM | POA: Diagnosis not present

## 2020-05-30 DIAGNOSIS — Z792 Long term (current) use of antibiotics: Secondary | ICD-10-CM | POA: Diagnosis not present

## 2020-05-30 DIAGNOSIS — G834 Cauda equina syndrome: Secondary | ICD-10-CM | POA: Diagnosis not present

## 2020-05-30 DIAGNOSIS — Z791 Long term (current) use of non-steroidal anti-inflammatories (NSAID): Secondary | ICD-10-CM | POA: Diagnosis not present

## 2020-05-30 DIAGNOSIS — Z466 Encounter for fitting and adjustment of urinary device: Secondary | ICD-10-CM | POA: Diagnosis not present

## 2020-05-30 DIAGNOSIS — Z993 Dependence on wheelchair: Secondary | ICD-10-CM | POA: Diagnosis not present

## 2020-05-30 DIAGNOSIS — Z9181 History of falling: Secondary | ICD-10-CM | POA: Diagnosis not present

## 2020-06-02 DIAGNOSIS — G8250 Quadriplegia, unspecified: Secondary | ICD-10-CM | POA: Diagnosis not present

## 2020-06-02 DIAGNOSIS — Z791 Long term (current) use of non-steroidal anti-inflammatories (NSAID): Secondary | ICD-10-CM | POA: Diagnosis not present

## 2020-06-02 DIAGNOSIS — Z792 Long term (current) use of antibiotics: Secondary | ICD-10-CM | POA: Diagnosis not present

## 2020-06-02 DIAGNOSIS — Z466 Encounter for fitting and adjustment of urinary device: Secondary | ICD-10-CM | POA: Diagnosis not present

## 2020-06-02 DIAGNOSIS — Z9181 History of falling: Secondary | ICD-10-CM | POA: Diagnosis not present

## 2020-06-02 DIAGNOSIS — G834 Cauda equina syndrome: Secondary | ICD-10-CM | POA: Diagnosis not present

## 2020-06-02 DIAGNOSIS — Z993 Dependence on wheelchair: Secondary | ICD-10-CM | POA: Diagnosis not present

## 2020-06-04 ENCOUNTER — Ambulatory Visit (INDEPENDENT_AMBULATORY_CARE_PROVIDER_SITE_OTHER): Payer: Medicare Other | Admitting: Internal Medicine

## 2020-06-04 ENCOUNTER — Other Ambulatory Visit: Payer: Self-pay

## 2020-06-04 ENCOUNTER — Encounter: Payer: Self-pay | Admitting: Internal Medicine

## 2020-06-04 VITALS — BP 108/72 | HR 78

## 2020-06-04 DIAGNOSIS — Z23 Encounter for immunization: Secondary | ICD-10-CM | POA: Diagnosis not present

## 2020-06-04 DIAGNOSIS — R6889 Other general symptoms and signs: Secondary | ICD-10-CM | POA: Diagnosis not present

## 2020-06-04 DIAGNOSIS — L8990 Pressure ulcer of unspecified site, unspecified stage: Secondary | ICD-10-CM | POA: Diagnosis not present

## 2020-06-04 DIAGNOSIS — G8250 Quadriplegia, unspecified: Secondary | ICD-10-CM | POA: Diagnosis not present

## 2020-06-04 DIAGNOSIS — Z466 Encounter for fitting and adjustment of urinary device: Secondary | ICD-10-CM | POA: Diagnosis not present

## 2020-06-04 DIAGNOSIS — R32 Unspecified urinary incontinence: Secondary | ICD-10-CM

## 2020-06-04 DIAGNOSIS — Z791 Long term (current) use of non-steroidal anti-inflammatories (NSAID): Secondary | ICD-10-CM | POA: Diagnosis not present

## 2020-06-04 DIAGNOSIS — Z993 Dependence on wheelchair: Secondary | ICD-10-CM

## 2020-06-04 DIAGNOSIS — G834 Cauda equina syndrome: Secondary | ICD-10-CM | POA: Diagnosis not present

## 2020-06-04 DIAGNOSIS — Z792 Long term (current) use of antibiotics: Secondary | ICD-10-CM | POA: Diagnosis not present

## 2020-06-04 DIAGNOSIS — Z9181 History of falling: Secondary | ICD-10-CM | POA: Diagnosis not present

## 2020-06-04 MED ORDER — DUODERM CGF EXTRA THIN EX MISC: 1.0000 | CUTANEOUS | 0 refills | Status: DC

## 2020-06-04 NOTE — Assessment & Plan Note (Signed)
It is a chronic problem.  Her chest is clear abdomen is slightly distended heart is regular

## 2020-06-04 NOTE — Assessment & Plan Note (Signed)
Patient is wheelchair dependent

## 2020-06-04 NOTE — Progress Notes (Signed)
Established Patient Office Visit  Subjective:  Patient ID: Heidi Hess, female    DOB: [birth year]  Age: 60 y.o. MRN: [id number]  CC:  Chief Complaint  Patient presents with  . quadriplegia and quadriparesis\    Patient is in need of new mattress for her hospital bed. Patient has been using hospital bed since 1993 and now needs a new mattress. Patient is unable to move to change body position and has limited mobility with only being able to use her arms she can not get into regular bed on her on. She needs hospital bed that can be raised and lowered for assistance with getting in the bed. Due to patient's paralyzation she has continuous catheter. Also patient had poor circulation.     Marland Kitchen Blister    Patient has blister left foot that she notice last week     HPI  Heidi Hess presents for general checkup.  Patient is quadriplegic and came on the wheelchair.  History reviewed. No pertinent past medical history.  History reviewed. No pertinent surgical history.  Family History  Problem Relation Age of Onset  . Breast cancer Paternal Aunt   . Breast cancer Cousin        maternal    Social History   Socioeconomic History  . Marital status: Single    Spouse name: Not on file  . Number of children: Not on file  . Years of education: Not on file  . Highest education level: Not on file  Occupational History  . Not on file  Tobacco Use  . Smoking status: Former Smoker    Quit date: 2010    Years since quitting: 12.2  . Smokeless tobacco: Never Used  Substance and Sexual Activity  . Alcohol use: Not on file  . Drug use: Not on file  . Sexual activity: Not on file  Other Topics Concern  . Not on file  Social History Narrative  . Not on file   Social Determinants of Health   Financial Resource Strain: Not on file  Food Insecurity: Not on file  Transportation Needs: Not on file  Physical Activity: Not on file  Stress: Not on file  Social Connections: Not on file   Intimate Partner Violence: Not on file     Current Outpatient Medications:  .  Ascorbic Acid (VITAMIN C) 500 MG CHEW, Chew 500 mg by mouth daily., Disp: , Rfl:  .  baclofen (LIORESAL) 10 MG tablet, Take 1 tablet (10 mg total) by mouth 2 (two) times daily., Disp: 60 each, Rfl: 2 .  baclofen (LIORESAL) 10 MG tablet, Take 1 tablet (10 mg total) by mouth 3 (three) times daily., Disp: 30 each, Rfl: 6 .  bisacodyl (DULCOLAX) 10 MG suppository, Place 10 mg rectally daily., Disp: , Rfl:  .  Control Gel Formula Dressing (DUODERM CGF EXTRA THIN) MISC, Apply 1 each topically once a week., Disp: 4 each, Rfl: 0 .  fluconazole (DIFLUCAN) 150 MG tablet, Take 1 tablet (150 mg total) by mouth daily., Disp: 30 tablet, Rfl: 3 .  loratadine (CLARITIN) 10 MG tablet, Take 10 mg by mouth daily., Disp: , Rfl:  .  Melatonin 1 MG CAPS, Take 1 capsule by mouth at bedtime as needed., Disp: , Rfl:  .  nitrofurantoin (MACRODANTIN) 50 MG capsule, Take 1 capsule (50 mg total) by mouth 2 (two) times daily., Disp: 180 capsule, Rfl: 3 .  nystatin ointment (MYCOSTATIN), Apply 30 g topically daily., Disp: , Rfl:  .  oxybutynin (DITROPAN XL) 15 MG 24 hr tablet, Take 1 tablet (15 mg total) by mouth daily., Disp: 90 tablet, Rfl: 6 .  Simethicone 180 MG CAPS, Take 180 mg by mouth daily., Disp: , Rfl:    Not on File  ROS Review of Systems  HENT: Negative for congestion.   Respiratory:       Negative.  Cardiovascular: Negative for chest pain and leg swelling.  Gastrointestinal: Positive for constipation. Negative for abdominal pain and blood in stool.  Genitourinary:       Patient is incontinent and has a catheter  Musculoskeletal: Positive for gait problem and myalgias.       Patient is quadriplegic and is wheelchair-bound.  She has an ulcer on the right medial ankle and also on the left first metatarsal.  She is going to see the podiatrist.  In the meantime will prescribe DuoDERM for her  Hematological: Negative.    Psychiatric/Behavioral: Negative.       Objective:    Physical Exam Neurological:     Comments: Patient is quadriplegic and wheelchair bound.  She also has an indwelling Foley catheter and has an ulcer on the right medial ankle and left first metatarsal     BP 108/72   Pulse 78  Wt Readings from Last 3 Encounters:  12/19/19 170 lb (77.1 kg)     Health Maintenance Due  Topic Date Due  . Hepatitis C Screening  Never done  . HIV Screening  Never done  . PAP SMEAR-Modifier  Never done  . COLONOSCOPY (Pts 45-32yrs Insurance coverage will need to be confirmed)  Never done    There are no preventive care reminders to display for this patient.  Lab Results  Component Value Date   TSH 1.64 12/19/2019   Lab Results  Component Value Date   WBC 5.5 12/19/2019   HGB 13.8 12/19/2019   HCT 41.9 12/19/2019   MCV 89.9 12/19/2019   PLT 203 12/19/2019   Lab Results  Component Value Date   NA 137 12/19/2019   K 3.8 12/19/2019   CO2 25 12/19/2019   GLUCOSE 84 12/19/2019   BUN 16 12/19/2019   CREATININE 0.47 (L) 12/19/2019   BILITOT 0.5 12/19/2019   AST 16 12/19/2019   ALT 11 12/19/2019   PROT 7.1 12/19/2019   CALCIUM 9.0 12/19/2019   Lab Results  Component Value Date   CHOL 189 12/19/2019   Lab Results  Component Value Date   HDL 42 (L) 12/19/2019   Lab Results  Component Value Date   LDLCALC 126 (H) 12/19/2019   Lab Results  Component Value Date   TRIG 104 12/19/2019   Lab Results  Component Value Date   CHOLHDL 4.5 12/19/2019   No results found for: HGBA1C    Assessment & Plan:   Problem List Items Addressed This Visit      Nervous and Auditory   Quadriplegia and quadriparesis ([place])    It is a chronic problem.  Her chest is clear abdomen is slightly distended heart is regular        Musculoskeletal and Integument   Pressure injury of skin    Patient has an ulcer on the right medial ankle we will apply DuoDERM for that.  She also has pressure  ulcer on the left first metatarsal.  Patient will be referred to the podiatrist.      Relevant Orders   PR LAMBSWOOL SHEEPSKIN PAD     Other   Incontinence in female  Some sediment noted on the catheter she does not have any fever or chills      Wheelchair dependence    Patient is wheelchair dependent      Need for tetanus booster - Primary    Patient was given tetanus shot because of the pressure ulcers and wound on the feet.      Relevant Orders   Tdap vaccine greater than or equal to 7yo IM (Completed)      Meds ordered this encounter  Medications  . Control Gel Formula Dressing (DUODERM CGF EXTRA THIN) MISC    Sig: Apply 1 each topically once a week.    Dispense:  4 each    Refill:  0    Follow-up: No follow-ups on file.    [name], MD

## 2020-06-04 NOTE — Assessment & Plan Note (Signed)
Patient has an ulcer on the right medial ankle we will apply DuoDERM for that.  She also has pressure ulcer on the left first metatarsal.  Patient will be referred to the podiatrist.

## 2020-06-04 NOTE — Assessment & Plan Note (Signed)
Patient was given tetanus shot because of the pressure ulcers and wound on the feet.

## 2020-06-04 NOTE — Assessment & Plan Note (Signed)
Some sediment noted on the catheter she does not have any fever or chills

## 2020-06-06 DIAGNOSIS — Z9181 History of falling: Secondary | ICD-10-CM | POA: Diagnosis not present

## 2020-06-06 DIAGNOSIS — Z792 Long term (current) use of antibiotics: Secondary | ICD-10-CM | POA: Diagnosis not present

## 2020-06-06 DIAGNOSIS — G834 Cauda equina syndrome: Secondary | ICD-10-CM | POA: Diagnosis not present

## 2020-06-06 DIAGNOSIS — G8250 Quadriplegia, unspecified: Secondary | ICD-10-CM | POA: Diagnosis not present

## 2020-06-06 DIAGNOSIS — Z466 Encounter for fitting and adjustment of urinary device: Secondary | ICD-10-CM | POA: Diagnosis not present

## 2020-06-06 DIAGNOSIS — Z993 Dependence on wheelchair: Secondary | ICD-10-CM | POA: Diagnosis not present

## 2020-06-06 DIAGNOSIS — Z791 Long term (current) use of non-steroidal anti-inflammatories (NSAID): Secondary | ICD-10-CM | POA: Diagnosis not present

## 2020-06-09 DIAGNOSIS — B351 Tinea unguium: Secondary | ICD-10-CM | POA: Diagnosis not present

## 2020-06-09 DIAGNOSIS — Z792 Long term (current) use of antibiotics: Secondary | ICD-10-CM | POA: Diagnosis not present

## 2020-06-09 DIAGNOSIS — G834 Cauda equina syndrome: Secondary | ICD-10-CM | POA: Diagnosis not present

## 2020-06-09 DIAGNOSIS — Z791 Long term (current) use of non-steroidal anti-inflammatories (NSAID): Secondary | ICD-10-CM | POA: Diagnosis not present

## 2020-06-09 DIAGNOSIS — G8250 Quadriplegia, unspecified: Secondary | ICD-10-CM | POA: Diagnosis not present

## 2020-06-09 DIAGNOSIS — Z993 Dependence on wheelchair: Secondary | ICD-10-CM | POA: Diagnosis not present

## 2020-06-09 DIAGNOSIS — Z9181 History of falling: Secondary | ICD-10-CM | POA: Diagnosis not present

## 2020-06-09 DIAGNOSIS — Z466 Encounter for fitting and adjustment of urinary device: Secondary | ICD-10-CM | POA: Diagnosis not present

## 2020-06-09 DIAGNOSIS — I739 Peripheral vascular disease, unspecified: Secondary | ICD-10-CM | POA: Diagnosis not present

## 2020-06-10 DIAGNOSIS — Z466 Encounter for fitting and adjustment of urinary device: Secondary | ICD-10-CM | POA: Diagnosis not present

## 2020-06-10 DIAGNOSIS — Z792 Long term (current) use of antibiotics: Secondary | ICD-10-CM | POA: Diagnosis not present

## 2020-06-10 DIAGNOSIS — G834 Cauda equina syndrome: Secondary | ICD-10-CM | POA: Diagnosis not present

## 2020-06-10 DIAGNOSIS — Z9181 History of falling: Secondary | ICD-10-CM | POA: Diagnosis not present

## 2020-06-10 DIAGNOSIS — G8250 Quadriplegia, unspecified: Secondary | ICD-10-CM | POA: Diagnosis not present

## 2020-06-10 DIAGNOSIS — Z791 Long term (current) use of non-steroidal anti-inflammatories (NSAID): Secondary | ICD-10-CM | POA: Diagnosis not present

## 2020-06-10 DIAGNOSIS — Z993 Dependence on wheelchair: Secondary | ICD-10-CM | POA: Diagnosis not present

## 2020-06-11 DIAGNOSIS — G834 Cauda equina syndrome: Secondary | ICD-10-CM | POA: Diagnosis not present

## 2020-06-11 DIAGNOSIS — Z792 Long term (current) use of antibiotics: Secondary | ICD-10-CM | POA: Diagnosis not present

## 2020-06-11 DIAGNOSIS — Z791 Long term (current) use of non-steroidal anti-inflammatories (NSAID): Secondary | ICD-10-CM | POA: Diagnosis not present

## 2020-06-11 DIAGNOSIS — Z993 Dependence on wheelchair: Secondary | ICD-10-CM | POA: Diagnosis not present

## 2020-06-11 DIAGNOSIS — Z466 Encounter for fitting and adjustment of urinary device: Secondary | ICD-10-CM | POA: Diagnosis not present

## 2020-06-11 DIAGNOSIS — G8250 Quadriplegia, unspecified: Secondary | ICD-10-CM | POA: Diagnosis not present

## 2020-06-11 DIAGNOSIS — Z9181 History of falling: Secondary | ICD-10-CM | POA: Diagnosis not present

## 2020-06-13 DIAGNOSIS — Z466 Encounter for fitting and adjustment of urinary device: Secondary | ICD-10-CM | POA: Diagnosis not present

## 2020-06-13 DIAGNOSIS — Z791 Long term (current) use of non-steroidal anti-inflammatories (NSAID): Secondary | ICD-10-CM | POA: Diagnosis not present

## 2020-06-13 DIAGNOSIS — Z993 Dependence on wheelchair: Secondary | ICD-10-CM | POA: Diagnosis not present

## 2020-06-13 DIAGNOSIS — Z9181 History of falling: Secondary | ICD-10-CM | POA: Diagnosis not present

## 2020-06-13 DIAGNOSIS — G834 Cauda equina syndrome: Secondary | ICD-10-CM | POA: Diagnosis not present

## 2020-06-13 DIAGNOSIS — G8250 Quadriplegia, unspecified: Secondary | ICD-10-CM | POA: Diagnosis not present

## 2020-06-13 DIAGNOSIS — Z792 Long term (current) use of antibiotics: Secondary | ICD-10-CM | POA: Diagnosis not present

## 2020-06-16 DIAGNOSIS — Z792 Long term (current) use of antibiotics: Secondary | ICD-10-CM | POA: Diagnosis not present

## 2020-06-16 DIAGNOSIS — G834 Cauda equina syndrome: Secondary | ICD-10-CM | POA: Diagnosis not present

## 2020-06-16 DIAGNOSIS — Z466 Encounter for fitting and adjustment of urinary device: Secondary | ICD-10-CM | POA: Diagnosis not present

## 2020-06-16 DIAGNOSIS — G8250 Quadriplegia, unspecified: Secondary | ICD-10-CM | POA: Diagnosis not present

## 2020-06-16 DIAGNOSIS — Z9181 History of falling: Secondary | ICD-10-CM | POA: Diagnosis not present

## 2020-06-16 DIAGNOSIS — Z791 Long term (current) use of non-steroidal anti-inflammatories (NSAID): Secondary | ICD-10-CM | POA: Diagnosis not present

## 2020-06-16 DIAGNOSIS — Z993 Dependence on wheelchair: Secondary | ICD-10-CM | POA: Diagnosis not present

## 2020-06-18 DIAGNOSIS — G8250 Quadriplegia, unspecified: Secondary | ICD-10-CM | POA: Diagnosis not present

## 2020-06-18 DIAGNOSIS — Z9181 History of falling: Secondary | ICD-10-CM | POA: Diagnosis not present

## 2020-06-18 DIAGNOSIS — Z791 Long term (current) use of non-steroidal anti-inflammatories (NSAID): Secondary | ICD-10-CM | POA: Diagnosis not present

## 2020-06-18 DIAGNOSIS — G834 Cauda equina syndrome: Secondary | ICD-10-CM | POA: Diagnosis not present

## 2020-06-18 DIAGNOSIS — Z993 Dependence on wheelchair: Secondary | ICD-10-CM | POA: Diagnosis not present

## 2020-06-18 DIAGNOSIS — Z792 Long term (current) use of antibiotics: Secondary | ICD-10-CM | POA: Diagnosis not present

## 2020-06-18 DIAGNOSIS — Z466 Encounter for fitting and adjustment of urinary device: Secondary | ICD-10-CM | POA: Diagnosis not present

## 2020-06-19 ENCOUNTER — Other Ambulatory Visit (INDEPENDENT_AMBULATORY_CARE_PROVIDER_SITE_OTHER): Payer: Self-pay | Admitting: Vascular Surgery

## 2020-06-19 DIAGNOSIS — I739 Peripheral vascular disease, unspecified: Secondary | ICD-10-CM

## 2020-06-20 DIAGNOSIS — G834 Cauda equina syndrome: Secondary | ICD-10-CM | POA: Diagnosis not present

## 2020-06-20 DIAGNOSIS — G8250 Quadriplegia, unspecified: Secondary | ICD-10-CM | POA: Diagnosis not present

## 2020-06-20 DIAGNOSIS — Z466 Encounter for fitting and adjustment of urinary device: Secondary | ICD-10-CM | POA: Diagnosis not present

## 2020-06-20 DIAGNOSIS — Z993 Dependence on wheelchair: Secondary | ICD-10-CM | POA: Diagnosis not present

## 2020-06-20 DIAGNOSIS — Z791 Long term (current) use of non-steroidal anti-inflammatories (NSAID): Secondary | ICD-10-CM | POA: Diagnosis not present

## 2020-06-20 DIAGNOSIS — Z9181 History of falling: Secondary | ICD-10-CM | POA: Diagnosis not present

## 2020-06-20 DIAGNOSIS — Z792 Long term (current) use of antibiotics: Secondary | ICD-10-CM | POA: Diagnosis not present

## 2020-06-23 ENCOUNTER — Encounter (INDEPENDENT_AMBULATORY_CARE_PROVIDER_SITE_OTHER): Payer: Self-pay | Admitting: Vascular Surgery

## 2020-06-23 ENCOUNTER — Other Ambulatory Visit: Payer: Self-pay

## 2020-06-23 ENCOUNTER — Ambulatory Visit (INDEPENDENT_AMBULATORY_CARE_PROVIDER_SITE_OTHER): Payer: Medicare Other | Admitting: Vascular Surgery

## 2020-06-23 ENCOUNTER — Ambulatory Visit (INDEPENDENT_AMBULATORY_CARE_PROVIDER_SITE_OTHER): Payer: Medicare Other

## 2020-06-23 VITALS — BP 112/76 | HR 78 | Ht 66.0 in | Wt 170.0 lb

## 2020-06-23 DIAGNOSIS — Z993 Dependence on wheelchair: Secondary | ICD-10-CM | POA: Diagnosis not present

## 2020-06-23 DIAGNOSIS — G834 Cauda equina syndrome: Secondary | ICD-10-CM | POA: Diagnosis not present

## 2020-06-23 DIAGNOSIS — I739 Peripheral vascular disease, unspecified: Secondary | ICD-10-CM | POA: Diagnosis not present

## 2020-06-23 DIAGNOSIS — Z792 Long term (current) use of antibiotics: Secondary | ICD-10-CM | POA: Diagnosis not present

## 2020-06-23 DIAGNOSIS — G8250 Quadriplegia, unspecified: Secondary | ICD-10-CM | POA: Diagnosis not present

## 2020-06-23 DIAGNOSIS — Z9181 History of falling: Secondary | ICD-10-CM | POA: Diagnosis not present

## 2020-06-23 DIAGNOSIS — Z466 Encounter for fitting and adjustment of urinary device: Secondary | ICD-10-CM | POA: Diagnosis not present

## 2020-06-23 DIAGNOSIS — Z791 Long term (current) use of non-steroidal anti-inflammatories (NSAID): Secondary | ICD-10-CM | POA: Diagnosis not present

## 2020-06-25 DIAGNOSIS — Z9181 History of falling: Secondary | ICD-10-CM | POA: Diagnosis not present

## 2020-06-25 DIAGNOSIS — Z791 Long term (current) use of non-steroidal anti-inflammatories (NSAID): Secondary | ICD-10-CM | POA: Diagnosis not present

## 2020-06-25 DIAGNOSIS — G8250 Quadriplegia, unspecified: Secondary | ICD-10-CM | POA: Diagnosis not present

## 2020-06-25 DIAGNOSIS — G834 Cauda equina syndrome: Secondary | ICD-10-CM | POA: Diagnosis not present

## 2020-06-25 DIAGNOSIS — Z792 Long term (current) use of antibiotics: Secondary | ICD-10-CM | POA: Diagnosis not present

## 2020-06-25 DIAGNOSIS — Z466 Encounter for fitting and adjustment of urinary device: Secondary | ICD-10-CM | POA: Diagnosis not present

## 2020-06-25 DIAGNOSIS — Z993 Dependence on wheelchair: Secondary | ICD-10-CM | POA: Diagnosis not present

## 2020-06-25 DIAGNOSIS — I739 Peripheral vascular disease, unspecified: Secondary | ICD-10-CM | POA: Insufficient documentation

## 2020-06-25 NOTE — Progress Notes (Signed)
MRN : [id number]  Heidi Hess is a 60 y.o. (02-16-1961) female who presents with chief complaint of  Chief Complaint  Patient presents with  . New Patient (Initial Visit)    [name]. PVD. abi  . PVD  .  History of Present Illness:   Patient is seen for evaluation of leg swelling. The patient first noticed the swelling remotely but is now concerned because of an increase in the overall edema. The swelling is associated with discoloration. The patient notes that in the morning the legs are significantly improved but they steadily worsened throughout the course of the day. She is paraplegic and spend the better part of the day in a wheelchair. Elevation makes the legs better, dependency makes them much worse.   There is no history of ulcerations associated with the swelling.   The patient denies any recent changes in their medications.  The patient has not been wearing graduated compression.  The patient has no had any past angiography, interventions or vascular surgery.  The patient denies a history of DVT or PE. There is no prior history of phlebitis. There is no history of primary lymphedema.  There is no history of radiation treatment to the groin or pelvis No history of malignancies. No history of trauma or groin or pelvic surgery. No history of foreign travel or parasitic infections area.  ABI's are normal bilaterally   Current Meds  Medication Sig  . Acetaminophen 325 MG CAPS   . Ascorbic Acid (VITAMIN C) 500 MG CHEW Chew 500 mg by mouth daily.  . baclofen (LIORESAL) 10 MG tablet Take 1 tablet (10 mg total) by mouth 2 (two) times daily.  . baclofen (LIORESAL) 10 MG tablet Take 1 tablet (10 mg total) by mouth 3 (three) times daily.  . bisacodyl (DULCOLAX) 10 MG suppository Place 10 mg rectally daily.  . Calcium Carb-Cholecalciferol (OYSTER SHELL CALCIUM) 500-400 MG-UNIT TABS   . Control Gel Formula Dressing (DUODERM CGF EXTRA THIN) MISC Apply 1 each topically once a  week.  [name] Sodium (DSS) 250 MG CAPS   . fluconazole (DIFLUCAN) 150 MG tablet Take 1 tablet (150 mg total) by mouth daily.  Marland Kitchen loratadine (CLARITIN) 10 MG tablet Take 10 mg by mouth daily.  . Melatonin 1 MG CAPS Take 1 capsule by mouth at bedtime as needed.  . Multiple Vitamins-Minerals (CENTRUM ADULTS) TABS   . nitrofurantoin (MACRODANTIN) 50 MG capsule Take 1 capsule (50 mg total) by mouth 2 (two) times daily.  Marland Kitchen nystatin ointment (MYCOSTATIN) Apply 30 g topically daily.  . Olopatadine HCl 0.2 % SOLN   . oxybutynin (DITROPAN XL) 15 MG 24 hr tablet Take 1 tablet (15 mg total) by mouth daily.  . Simethicone 180 MG CAPS Take 180 mg by mouth daily.    No past medical history on file.  No past surgical history on file.  Social History Social History   Tobacco Use  . Smoking status: Former Smoker    Quit date: 2010    Years since quitting: 12.3  . Smokeless tobacco: Never Used    Family History Family History  Problem Relation Age of Onset  . Breast cancer Paternal Aunt   . Breast cancer Cousin        maternal  No family history of bleeding/clotting disorders, porphyria or autoimmune disease   Allergies  Allergen Reactions  . Latex Rash  . Nickel Dermatitis, Itching and Rash  . Sulfa Antibiotics Itching, Nausea Only and Rash  REVIEW OF SYSTEMS (Negative unless checked)  Constitutional: [] Weight loss  [] Fever  [] Chills Cardiac: [] Chest pain   [] Chest pressure   [] Palpitations   [] Shortness of breath when laying flat   [] Shortness of breath with exertion. Vascular:  [] Pain in legs with walking   [] Pain in legs at rest  [] History of DVT   [] Phlebitis   [x] Swelling in legs   [] Varicose veins   [] Non-healing ulcers Pulmonary:   [] Uses home oxygen   [] Productive cough   [] Hemoptysis   [] Wheeze  [] COPD   [] Asthma Neurologic:  [] Dizziness   [] Seizures   [] History of stroke   [] History of TIA  [] Aphasia   [] Vissual changes   [] Weakness or numbness in arm   [x] Weakness or  numbness in leg Musculoskeletal:   [] Joint swelling   [] Joint pain   [] Low back pain Hematologic:  [] Easy bruising  [] Easy bleeding   [] Hypercoagulable state   [] Anemic Gastrointestinal:  [] Diarrhea   [] Vomiting  [] Gastroesophageal reflux/heartburn   [] Difficulty swallowing. Genitourinary:  [] Chronic kidney disease   [] Difficult urination  [] Frequent urination   [] Blood in urine Skin:  [] Rashes   [] Ulcers  Psychological:  [] History of anxiety   []  History of major depression.  Physical Examination  Vitals:   06/23/20 1510  BP: 112/76  Pulse: 78  Weight: 170 lb (77.1 kg)  Height: 5\' 6"  (1.676 m)   Body mass index is 27.44 kg/m. Gen: WD/WN, NAD Head: [place]/AT, No temporalis wasting.  Ear/Nose/Throat: Hearing grossly intact, nares w/o erythema or drainage, poor dentition Eyes: PER, EOMI, sclera nonicteric.  Neck: Supple, no masses.  No bruit or JVD.  Pulmonary:  Good air movement, clear to auscultation bilaterally, no use of accessory muscles.  Cardiac: RRR, normal S1, S2, no Murmurs. Vascular: scattered varicosities present bilaterally.  Moderate venous stasis changes to the legs bilaterally.  2+ soft pitting edema. Vessel Right Left  Radial Palpable Palpable  PT Trace Palpable Trace Palpable  DP Trace Palpable Trace Palpable  Gastrointestinal: soft, non-distended. No guarding/no peritoneal signs.  Musculoskeletal: M/S 5/5 throughout.  No deformity or atrophy.  Neurologic: CN 2-12 intact. Pain and light touch intact in extremities.  Symmetrical.  Speech is fluent. Motor exam as listed above. Psychiatric: Judgment intact, Mood & affect appropriate for pt's clinical situation. Dermatologic: No rashes or ulcers noted.  No changes consistent with cellulitis.   CBC Lab Results  Component Value Date   WBC 5.5 12/19/2019   HGB 13.8 12/19/2019   HCT 41.9 12/19/2019   MCV 89.9 12/19/2019   PLT 203 12/19/2019    BMET    Component Value Date/Time   NA 137 12/19/2019 1102   K 3.8  12/19/2019 1102   CL 102 12/19/2019 1102   CO2 25 12/19/2019 1102   GLUCOSE 84 12/19/2019 1102   BUN 16 12/19/2019 1102   CREATININE 0.47 (L) 12/19/2019 1102   CALCIUM 9.0 12/19/2019 1102   GFRNONAA 107 12/19/2019 1102   GFRAA 124 12/19/2019 1102   CrCl cannot be calculated (Patient's most recent lab result is older than the maximum 21 days allowed.).  COAG No results found for: INR, PROTIME  Radiology VAS [place] ABI WITH/WO TBI  Result Date: 06/23/2020 LOWER EXTREMITY DOPPLER STUDY Indications: Pedal pulse clinically decreased per podiatrist. Other Factors: Paraplegic.  Performing Technologist: Concha Norway RVT  Examination Guidelines: A complete evaluation includes at minimum, Doppler waveform signals and systolic blood pressure reading at the level of bilateral brachial, anterior tibial, and posterior tibial arteries, when vessel segments are  accessible. Bilateral testing is considered an integral part of a complete examination. Photoelectric Plethysmograph (PPG) waveforms and toe systolic pressure readings are included as required and additional duplex testing as needed. Limited examinations for reoccurring indications may be performed as noted.  ABI Findings: +---------+------------------+-----+---------+--------+ Right    Rt Pressure (mmHg)IndexWaveform Comment  +---------+------------------+-----+---------+--------+ ATA      111               1.23 biphasic          +---------+------------------+-----+---------+--------+ PTA      123               1.37 triphasic         +---------+------------------+-----+---------+--------+ Great [id number]               1.20 Abnormal          +---------+------------------+-----+---------+--------+ +---------+------------------+-----+---------+-------+ Left     Lt Pressure (mmHg)IndexWaveform Comment +---------+------------------+-----+---------+-------+ Brachial 90                                       +---------+------------------+-----+---------+-------+ ATA      121               1.34 biphasic         +---------+------------------+-----+---------+-------+ PTA      123               1.37 triphasic        +---------+------------------+-----+---------+-------+ Charlean Merl               1.29 Abnormal         +---------+------------------+-----+---------+-------+  Summary: Right: Resting right ankle-brachial index is within normal range. No evidence of significant right lower extremity arterial disease. The right toe-brachial index is normal. Left: Resting left ankle-brachial index is within normal range. No evidence of significant left lower extremity arterial disease. The left toe-brachial index is normal.  *See table(s) above for measurements and observations.  Electronically signed by Hortencia Pilar MD on 06/23/2020 at 4:52:51 PM.   Final      Assessment/Plan 1. PVD (peripheral vascular disease) ([place]) Recommend:  I do not find evidence of life style limiting vascular disease. The patient specifically denies life style limitation.  Previous noninvasive studies including ABI's of the legs do not identify critical vascular problems.  The patient should continue walking and begin a more formal exercise program. The patient should continue his antiplatelet therapy and aggressive treatment of the lipid abnormalities.  The patient should begin wearing graduated compression socks 15-20 mmHg strength to control her mild edema.  Patient will follow-up with me on a PRN basis   2. Quadriplegia and quadriparesis ([place]) Recommend:  I do not find evidence of life style limiting vascular disease. The patient specifically denies life style limitation.  Previous noninvasive studies including ABI's of the legs do not identify critical vascular problems.  The patient should continue walking and begin a more formal exercise program. The patient should continue his antiplatelet therapy  and aggressive treatment of the lipid abnormalities.  The patient should begin wearing graduated compression socks 15-20 mmHg strength to control her mild edema.  Patient will follow-up with me on a PRN basis   3. Wheelchair dependence Recommend:  I do not find evidence of life style limiting vascular disease. The patient specifically denies life style limitation.  Previous noninvasive studies including ABI's of the legs do not identify critical vascular  problems.  The patient should continue walking and begin a more formal exercise program. The patient should continue his antiplatelet therapy and aggressive treatment of the lipid abnormalities.  The patient should begin wearing graduated compression socks 15-20 mmHg strength to control her mild edema.  Patient will follow-up with me on a PRN basis     Hortencia Pilar, MD  06/25/2020 12:34 PM

## 2020-06-27 DIAGNOSIS — G834 Cauda equina syndrome: Secondary | ICD-10-CM | POA: Diagnosis not present

## 2020-06-27 DIAGNOSIS — Z792 Long term (current) use of antibiotics: Secondary | ICD-10-CM | POA: Diagnosis not present

## 2020-06-27 DIAGNOSIS — G8250 Quadriplegia, unspecified: Secondary | ICD-10-CM | POA: Diagnosis not present

## 2020-06-27 DIAGNOSIS — Z9181 History of falling: Secondary | ICD-10-CM | POA: Diagnosis not present

## 2020-06-27 DIAGNOSIS — Z466 Encounter for fitting and adjustment of urinary device: Secondary | ICD-10-CM | POA: Diagnosis not present

## 2020-06-27 DIAGNOSIS — Z993 Dependence on wheelchair: Secondary | ICD-10-CM | POA: Diagnosis not present

## 2020-06-27 DIAGNOSIS — Z791 Long term (current) use of non-steroidal anti-inflammatories (NSAID): Secondary | ICD-10-CM | POA: Diagnosis not present

## 2020-06-30 DIAGNOSIS — Z792 Long term (current) use of antibiotics: Secondary | ICD-10-CM | POA: Diagnosis not present

## 2020-06-30 DIAGNOSIS — Z993 Dependence on wheelchair: Secondary | ICD-10-CM | POA: Diagnosis not present

## 2020-06-30 DIAGNOSIS — Z466 Encounter for fitting and adjustment of urinary device: Secondary | ICD-10-CM | POA: Diagnosis not present

## 2020-06-30 DIAGNOSIS — Z791 Long term (current) use of non-steroidal anti-inflammatories (NSAID): Secondary | ICD-10-CM | POA: Diagnosis not present

## 2020-06-30 DIAGNOSIS — G834 Cauda equina syndrome: Secondary | ICD-10-CM | POA: Diagnosis not present

## 2020-06-30 DIAGNOSIS — G8250 Quadriplegia, unspecified: Secondary | ICD-10-CM | POA: Diagnosis not present

## 2020-06-30 DIAGNOSIS — Z9181 History of falling: Secondary | ICD-10-CM | POA: Diagnosis not present

## 2020-07-01 ENCOUNTER — Encounter (INDEPENDENT_AMBULATORY_CARE_PROVIDER_SITE_OTHER): Payer: Self-pay | Admitting: Vascular Surgery

## 2020-07-01 DIAGNOSIS — Z792 Long term (current) use of antibiotics: Secondary | ICD-10-CM | POA: Diagnosis not present

## 2020-07-01 DIAGNOSIS — G8250 Quadriplegia, unspecified: Secondary | ICD-10-CM | POA: Diagnosis not present

## 2020-07-01 DIAGNOSIS — Z791 Long term (current) use of non-steroidal anti-inflammatories (NSAID): Secondary | ICD-10-CM | POA: Diagnosis not present

## 2020-07-01 DIAGNOSIS — Z9181 History of falling: Secondary | ICD-10-CM | POA: Diagnosis not present

## 2020-07-01 DIAGNOSIS — Z466 Encounter for fitting and adjustment of urinary device: Secondary | ICD-10-CM | POA: Diagnosis not present

## 2020-07-01 DIAGNOSIS — Z993 Dependence on wheelchair: Secondary | ICD-10-CM | POA: Diagnosis not present

## 2020-07-01 DIAGNOSIS — G834 Cauda equina syndrome: Secondary | ICD-10-CM | POA: Diagnosis not present

## 2020-07-02 DIAGNOSIS — Z993 Dependence on wheelchair: Secondary | ICD-10-CM | POA: Diagnosis not present

## 2020-07-02 DIAGNOSIS — Z791 Long term (current) use of non-steroidal anti-inflammatories (NSAID): Secondary | ICD-10-CM | POA: Diagnosis not present

## 2020-07-02 DIAGNOSIS — G834 Cauda equina syndrome: Secondary | ICD-10-CM | POA: Diagnosis not present

## 2020-07-02 DIAGNOSIS — Z792 Long term (current) use of antibiotics: Secondary | ICD-10-CM | POA: Diagnosis not present

## 2020-07-02 DIAGNOSIS — Z9181 History of falling: Secondary | ICD-10-CM | POA: Diagnosis not present

## 2020-07-02 DIAGNOSIS — Z466 Encounter for fitting and adjustment of urinary device: Secondary | ICD-10-CM | POA: Diagnosis not present

## 2020-07-02 DIAGNOSIS — G8250 Quadriplegia, unspecified: Secondary | ICD-10-CM | POA: Diagnosis not present

## 2020-07-04 DIAGNOSIS — G8250 Quadriplegia, unspecified: Secondary | ICD-10-CM | POA: Diagnosis not present

## 2020-07-04 DIAGNOSIS — Z791 Long term (current) use of non-steroidal anti-inflammatories (NSAID): Secondary | ICD-10-CM | POA: Diagnosis not present

## 2020-07-04 DIAGNOSIS — Z9181 History of falling: Secondary | ICD-10-CM | POA: Diagnosis not present

## 2020-07-04 DIAGNOSIS — G834 Cauda equina syndrome: Secondary | ICD-10-CM | POA: Diagnosis not present

## 2020-07-04 DIAGNOSIS — Z466 Encounter for fitting and adjustment of urinary device: Secondary | ICD-10-CM | POA: Diagnosis not present

## 2020-07-04 DIAGNOSIS — Z792 Long term (current) use of antibiotics: Secondary | ICD-10-CM | POA: Diagnosis not present

## 2020-07-04 DIAGNOSIS — Z993 Dependence on wheelchair: Secondary | ICD-10-CM | POA: Diagnosis not present

## 2020-07-06 DIAGNOSIS — Z87440 Personal history of urinary (tract) infections: Secondary | ICD-10-CM | POA: Diagnosis not present

## 2020-07-06 DIAGNOSIS — R339 Retention of urine, unspecified: Secondary | ICD-10-CM | POA: Diagnosis not present

## 2020-07-06 DIAGNOSIS — G8250 Quadriplegia, unspecified: Secondary | ICD-10-CM | POA: Diagnosis not present

## 2020-07-07 DIAGNOSIS — Z466 Encounter for fitting and adjustment of urinary device: Secondary | ICD-10-CM | POA: Diagnosis not present

## 2020-07-07 DIAGNOSIS — Z9181 History of falling: Secondary | ICD-10-CM | POA: Diagnosis not present

## 2020-07-07 DIAGNOSIS — G834 Cauda equina syndrome: Secondary | ICD-10-CM | POA: Diagnosis not present

## 2020-07-07 DIAGNOSIS — G8250 Quadriplegia, unspecified: Secondary | ICD-10-CM | POA: Diagnosis not present

## 2020-07-07 DIAGNOSIS — Z791 Long term (current) use of non-steroidal anti-inflammatories (NSAID): Secondary | ICD-10-CM | POA: Diagnosis not present

## 2020-07-07 DIAGNOSIS — Z792 Long term (current) use of antibiotics: Secondary | ICD-10-CM | POA: Diagnosis not present

## 2020-07-07 DIAGNOSIS — Z993 Dependence on wheelchair: Secondary | ICD-10-CM | POA: Diagnosis not present

## 2020-07-09 DIAGNOSIS — Z466 Encounter for fitting and adjustment of urinary device: Secondary | ICD-10-CM | POA: Diagnosis not present

## 2020-07-09 DIAGNOSIS — Z791 Long term (current) use of non-steroidal anti-inflammatories (NSAID): Secondary | ICD-10-CM | POA: Diagnosis not present

## 2020-07-09 DIAGNOSIS — G834 Cauda equina syndrome: Secondary | ICD-10-CM | POA: Diagnosis not present

## 2020-07-09 DIAGNOSIS — Z9181 History of falling: Secondary | ICD-10-CM | POA: Diagnosis not present

## 2020-07-09 DIAGNOSIS — Z792 Long term (current) use of antibiotics: Secondary | ICD-10-CM | POA: Diagnosis not present

## 2020-07-09 DIAGNOSIS — R6889 Other general symptoms and signs: Secondary | ICD-10-CM | POA: Diagnosis not present

## 2020-07-09 DIAGNOSIS — G8250 Quadriplegia, unspecified: Secondary | ICD-10-CM | POA: Diagnosis not present

## 2020-07-09 DIAGNOSIS — Z993 Dependence on wheelchair: Secondary | ICD-10-CM | POA: Diagnosis not present

## 2020-07-11 DIAGNOSIS — Z466 Encounter for fitting and adjustment of urinary device: Secondary | ICD-10-CM | POA: Diagnosis not present

## 2020-07-11 DIAGNOSIS — Z993 Dependence on wheelchair: Secondary | ICD-10-CM | POA: Diagnosis not present

## 2020-07-11 DIAGNOSIS — G8250 Quadriplegia, unspecified: Secondary | ICD-10-CM | POA: Diagnosis not present

## 2020-07-11 DIAGNOSIS — Z792 Long term (current) use of antibiotics: Secondary | ICD-10-CM | POA: Diagnosis not present

## 2020-07-11 DIAGNOSIS — Z9181 History of falling: Secondary | ICD-10-CM | POA: Diagnosis not present

## 2020-07-11 DIAGNOSIS — G834 Cauda equina syndrome: Secondary | ICD-10-CM | POA: Diagnosis not present

## 2020-07-11 DIAGNOSIS — Z791 Long term (current) use of non-steroidal anti-inflammatories (NSAID): Secondary | ICD-10-CM | POA: Diagnosis not present

## 2020-07-14 DIAGNOSIS — Z792 Long term (current) use of antibiotics: Secondary | ICD-10-CM | POA: Diagnosis not present

## 2020-07-14 DIAGNOSIS — Z466 Encounter for fitting and adjustment of urinary device: Secondary | ICD-10-CM | POA: Diagnosis not present

## 2020-07-14 DIAGNOSIS — Z9181 History of falling: Secondary | ICD-10-CM | POA: Diagnosis not present

## 2020-07-14 DIAGNOSIS — Z993 Dependence on wheelchair: Secondary | ICD-10-CM | POA: Diagnosis not present

## 2020-07-14 DIAGNOSIS — Z791 Long term (current) use of non-steroidal anti-inflammatories (NSAID): Secondary | ICD-10-CM | POA: Diagnosis not present

## 2020-07-14 DIAGNOSIS — G834 Cauda equina syndrome: Secondary | ICD-10-CM | POA: Diagnosis not present

## 2020-07-14 DIAGNOSIS — G8250 Quadriplegia, unspecified: Secondary | ICD-10-CM | POA: Diagnosis not present

## 2020-07-15 DIAGNOSIS — Z791 Long term (current) use of non-steroidal anti-inflammatories (NSAID): Secondary | ICD-10-CM | POA: Diagnosis not present

## 2020-07-15 DIAGNOSIS — G834 Cauda equina syndrome: Secondary | ICD-10-CM | POA: Diagnosis not present

## 2020-07-15 DIAGNOSIS — Z466 Encounter for fitting and adjustment of urinary device: Secondary | ICD-10-CM | POA: Diagnosis not present

## 2020-07-15 DIAGNOSIS — G8250 Quadriplegia, unspecified: Secondary | ICD-10-CM | POA: Diagnosis not present

## 2020-07-15 DIAGNOSIS — Z993 Dependence on wheelchair: Secondary | ICD-10-CM | POA: Diagnosis not present

## 2020-07-15 DIAGNOSIS — Z792 Long term (current) use of antibiotics: Secondary | ICD-10-CM | POA: Diagnosis not present

## 2020-07-15 DIAGNOSIS — Z9181 History of falling: Secondary | ICD-10-CM | POA: Diagnosis not present

## 2020-07-16 DIAGNOSIS — Z466 Encounter for fitting and adjustment of urinary device: Secondary | ICD-10-CM | POA: Diagnosis not present

## 2020-07-16 DIAGNOSIS — G834 Cauda equina syndrome: Secondary | ICD-10-CM | POA: Diagnosis not present

## 2020-07-16 DIAGNOSIS — Z993 Dependence on wheelchair: Secondary | ICD-10-CM | POA: Diagnosis not present

## 2020-07-16 DIAGNOSIS — Z791 Long term (current) use of non-steroidal anti-inflammatories (NSAID): Secondary | ICD-10-CM | POA: Diagnosis not present

## 2020-07-16 DIAGNOSIS — G8250 Quadriplegia, unspecified: Secondary | ICD-10-CM | POA: Diagnosis not present

## 2020-07-16 DIAGNOSIS — Z792 Long term (current) use of antibiotics: Secondary | ICD-10-CM | POA: Diagnosis not present

## 2020-07-16 DIAGNOSIS — Z9181 History of falling: Secondary | ICD-10-CM | POA: Diagnosis not present

## 2020-07-18 DIAGNOSIS — Z993 Dependence on wheelchair: Secondary | ICD-10-CM | POA: Diagnosis not present

## 2020-07-18 DIAGNOSIS — Z792 Long term (current) use of antibiotics: Secondary | ICD-10-CM | POA: Diagnosis not present

## 2020-07-18 DIAGNOSIS — G8250 Quadriplegia, unspecified: Secondary | ICD-10-CM | POA: Diagnosis not present

## 2020-07-18 DIAGNOSIS — G834 Cauda equina syndrome: Secondary | ICD-10-CM | POA: Diagnosis not present

## 2020-07-18 DIAGNOSIS — Z9181 History of falling: Secondary | ICD-10-CM | POA: Diagnosis not present

## 2020-07-18 DIAGNOSIS — Z466 Encounter for fitting and adjustment of urinary device: Secondary | ICD-10-CM | POA: Diagnosis not present

## 2020-07-18 DIAGNOSIS — Z791 Long term (current) use of non-steroidal anti-inflammatories (NSAID): Secondary | ICD-10-CM | POA: Diagnosis not present

## 2020-07-21 DIAGNOSIS — Z791 Long term (current) use of non-steroidal anti-inflammatories (NSAID): Secondary | ICD-10-CM | POA: Diagnosis not present

## 2020-07-21 DIAGNOSIS — Z993 Dependence on wheelchair: Secondary | ICD-10-CM | POA: Diagnosis not present

## 2020-07-21 DIAGNOSIS — G834 Cauda equina syndrome: Secondary | ICD-10-CM | POA: Diagnosis not present

## 2020-07-21 DIAGNOSIS — G8250 Quadriplegia, unspecified: Secondary | ICD-10-CM | POA: Diagnosis not present

## 2020-07-21 DIAGNOSIS — Z466 Encounter for fitting and adjustment of urinary device: Secondary | ICD-10-CM | POA: Diagnosis not present

## 2020-07-21 DIAGNOSIS — Z792 Long term (current) use of antibiotics: Secondary | ICD-10-CM | POA: Diagnosis not present

## 2020-07-21 DIAGNOSIS — Z9181 History of falling: Secondary | ICD-10-CM | POA: Diagnosis not present

## 2020-07-22 DIAGNOSIS — Z466 Encounter for fitting and adjustment of urinary device: Secondary | ICD-10-CM | POA: Diagnosis not present

## 2020-07-22 DIAGNOSIS — G8250 Quadriplegia, unspecified: Secondary | ICD-10-CM | POA: Diagnosis not present

## 2020-07-22 DIAGNOSIS — Z791 Long term (current) use of non-steroidal anti-inflammatories (NSAID): Secondary | ICD-10-CM | POA: Diagnosis not present

## 2020-07-22 DIAGNOSIS — G834 Cauda equina syndrome: Secondary | ICD-10-CM | POA: Diagnosis not present

## 2020-07-22 DIAGNOSIS — Z792 Long term (current) use of antibiotics: Secondary | ICD-10-CM | POA: Diagnosis not present

## 2020-07-22 DIAGNOSIS — Z9181 History of falling: Secondary | ICD-10-CM | POA: Diagnosis not present

## 2020-07-22 DIAGNOSIS — Z993 Dependence on wheelchair: Secondary | ICD-10-CM | POA: Diagnosis not present

## 2020-07-23 DIAGNOSIS — Z9181 History of falling: Secondary | ICD-10-CM | POA: Diagnosis not present

## 2020-07-23 DIAGNOSIS — Z792 Long term (current) use of antibiotics: Secondary | ICD-10-CM | POA: Diagnosis not present

## 2020-07-23 DIAGNOSIS — G8250 Quadriplegia, unspecified: Secondary | ICD-10-CM | POA: Diagnosis not present

## 2020-07-23 DIAGNOSIS — Z466 Encounter for fitting and adjustment of urinary device: Secondary | ICD-10-CM | POA: Diagnosis not present

## 2020-07-23 DIAGNOSIS — Z993 Dependence on wheelchair: Secondary | ICD-10-CM | POA: Diagnosis not present

## 2020-07-23 DIAGNOSIS — G834 Cauda equina syndrome: Secondary | ICD-10-CM | POA: Diagnosis not present

## 2020-07-23 DIAGNOSIS — Z791 Long term (current) use of non-steroidal anti-inflammatories (NSAID): Secondary | ICD-10-CM | POA: Diagnosis not present

## 2020-07-25 DIAGNOSIS — Z993 Dependence on wheelchair: Secondary | ICD-10-CM | POA: Diagnosis not present

## 2020-07-25 DIAGNOSIS — G8250 Quadriplegia, unspecified: Secondary | ICD-10-CM | POA: Diagnosis not present

## 2020-07-25 DIAGNOSIS — Z466 Encounter for fitting and adjustment of urinary device: Secondary | ICD-10-CM | POA: Diagnosis not present

## 2020-07-25 DIAGNOSIS — Z9181 History of falling: Secondary | ICD-10-CM | POA: Diagnosis not present

## 2020-07-25 DIAGNOSIS — Z792 Long term (current) use of antibiotics: Secondary | ICD-10-CM | POA: Diagnosis not present

## 2020-07-25 DIAGNOSIS — G834 Cauda equina syndrome: Secondary | ICD-10-CM | POA: Diagnosis not present

## 2020-07-25 DIAGNOSIS — Z791 Long term (current) use of non-steroidal anti-inflammatories (NSAID): Secondary | ICD-10-CM | POA: Diagnosis not present

## 2020-07-28 DIAGNOSIS — G8250 Quadriplegia, unspecified: Secondary | ICD-10-CM | POA: Diagnosis not present

## 2020-07-28 DIAGNOSIS — Z792 Long term (current) use of antibiotics: Secondary | ICD-10-CM | POA: Diagnosis not present

## 2020-07-28 DIAGNOSIS — Z9181 History of falling: Secondary | ICD-10-CM | POA: Diagnosis not present

## 2020-07-28 DIAGNOSIS — Z791 Long term (current) use of non-steroidal anti-inflammatories (NSAID): Secondary | ICD-10-CM | POA: Diagnosis not present

## 2020-07-28 DIAGNOSIS — Z993 Dependence on wheelchair: Secondary | ICD-10-CM | POA: Diagnosis not present

## 2020-07-28 DIAGNOSIS — G834 Cauda equina syndrome: Secondary | ICD-10-CM | POA: Diagnosis not present

## 2020-07-28 DIAGNOSIS — Z466 Encounter for fitting and adjustment of urinary device: Secondary | ICD-10-CM | POA: Diagnosis not present

## 2020-07-30 DIAGNOSIS — Z791 Long term (current) use of non-steroidal anti-inflammatories (NSAID): Secondary | ICD-10-CM | POA: Diagnosis not present

## 2020-07-30 DIAGNOSIS — G8250 Quadriplegia, unspecified: Secondary | ICD-10-CM | POA: Diagnosis not present

## 2020-07-30 DIAGNOSIS — G834 Cauda equina syndrome: Secondary | ICD-10-CM | POA: Diagnosis not present

## 2020-07-30 DIAGNOSIS — Z993 Dependence on wheelchair: Secondary | ICD-10-CM | POA: Diagnosis not present

## 2020-07-30 DIAGNOSIS — Z466 Encounter for fitting and adjustment of urinary device: Secondary | ICD-10-CM | POA: Diagnosis not present

## 2020-07-30 DIAGNOSIS — Z9181 History of falling: Secondary | ICD-10-CM | POA: Diagnosis not present

## 2020-07-30 DIAGNOSIS — Z792 Long term (current) use of antibiotics: Secondary | ICD-10-CM | POA: Diagnosis not present

## 2020-08-01 DIAGNOSIS — G8250 Quadriplegia, unspecified: Secondary | ICD-10-CM | POA: Diagnosis not present

## 2020-08-01 DIAGNOSIS — Z466 Encounter for fitting and adjustment of urinary device: Secondary | ICD-10-CM | POA: Diagnosis not present

## 2020-08-01 DIAGNOSIS — Z791 Long term (current) use of non-steroidal anti-inflammatories (NSAID): Secondary | ICD-10-CM | POA: Diagnosis not present

## 2020-08-01 DIAGNOSIS — Z9181 History of falling: Secondary | ICD-10-CM | POA: Diagnosis not present

## 2020-08-01 DIAGNOSIS — Z993 Dependence on wheelchair: Secondary | ICD-10-CM | POA: Diagnosis not present

## 2020-08-01 DIAGNOSIS — G834 Cauda equina syndrome: Secondary | ICD-10-CM | POA: Diagnosis not present

## 2020-08-01 DIAGNOSIS — Z792 Long term (current) use of antibiotics: Secondary | ICD-10-CM | POA: Diagnosis not present

## 2020-08-05 DIAGNOSIS — G834 Cauda equina syndrome: Secondary | ICD-10-CM | POA: Diagnosis not present

## 2020-08-05 DIAGNOSIS — Z993 Dependence on wheelchair: Secondary | ICD-10-CM | POA: Diagnosis not present

## 2020-08-05 DIAGNOSIS — Z9181 History of falling: Secondary | ICD-10-CM | POA: Diagnosis not present

## 2020-08-05 DIAGNOSIS — Z792 Long term (current) use of antibiotics: Secondary | ICD-10-CM | POA: Diagnosis not present

## 2020-08-05 DIAGNOSIS — Z466 Encounter for fitting and adjustment of urinary device: Secondary | ICD-10-CM | POA: Diagnosis not present

## 2020-08-05 DIAGNOSIS — Z87440 Personal history of urinary (tract) infections: Secondary | ICD-10-CM | POA: Diagnosis not present

## 2020-08-05 DIAGNOSIS — R339 Retention of urine, unspecified: Secondary | ICD-10-CM | POA: Diagnosis not present

## 2020-08-05 DIAGNOSIS — G8250 Quadriplegia, unspecified: Secondary | ICD-10-CM | POA: Diagnosis not present

## 2020-08-05 DIAGNOSIS — Z791 Long term (current) use of non-steroidal anti-inflammatories (NSAID): Secondary | ICD-10-CM | POA: Diagnosis not present

## 2020-08-06 DIAGNOSIS — Z466 Encounter for fitting and adjustment of urinary device: Secondary | ICD-10-CM | POA: Diagnosis not present

## 2020-08-06 DIAGNOSIS — Z9181 History of falling: Secondary | ICD-10-CM | POA: Diagnosis not present

## 2020-08-06 DIAGNOSIS — Z792 Long term (current) use of antibiotics: Secondary | ICD-10-CM | POA: Diagnosis not present

## 2020-08-06 DIAGNOSIS — G8250 Quadriplegia, unspecified: Secondary | ICD-10-CM | POA: Diagnosis not present

## 2020-08-06 DIAGNOSIS — Z993 Dependence on wheelchair: Secondary | ICD-10-CM | POA: Diagnosis not present

## 2020-08-06 DIAGNOSIS — G834 Cauda equina syndrome: Secondary | ICD-10-CM | POA: Diagnosis not present

## 2020-08-06 DIAGNOSIS — Z791 Long term (current) use of non-steroidal anti-inflammatories (NSAID): Secondary | ICD-10-CM | POA: Diagnosis not present

## 2020-08-11 ENCOUNTER — Other Ambulatory Visit: Payer: Self-pay | Admitting: *Deleted

## 2020-08-11 ENCOUNTER — Ambulatory Visit: Payer: Medicare Other | Admitting: Internal Medicine

## 2020-08-11 DIAGNOSIS — Z9181 History of falling: Secondary | ICD-10-CM | POA: Diagnosis not present

## 2020-08-11 DIAGNOSIS — Z792 Long term (current) use of antibiotics: Secondary | ICD-10-CM | POA: Diagnosis not present

## 2020-08-11 DIAGNOSIS — Z993 Dependence on wheelchair: Secondary | ICD-10-CM | POA: Diagnosis not present

## 2020-08-11 DIAGNOSIS — G834 Cauda equina syndrome: Secondary | ICD-10-CM | POA: Diagnosis not present

## 2020-08-11 DIAGNOSIS — Z791 Long term (current) use of non-steroidal anti-inflammatories (NSAID): Secondary | ICD-10-CM | POA: Diagnosis not present

## 2020-08-11 DIAGNOSIS — G8250 Quadriplegia, unspecified: Secondary | ICD-10-CM | POA: Diagnosis not present

## 2020-08-11 DIAGNOSIS — Z466 Encounter for fitting and adjustment of urinary device: Secondary | ICD-10-CM | POA: Diagnosis not present

## 2020-08-11 MED ORDER — BACLOFEN 10 MG PO TABS
10.0000 mg | ORAL_TABLET | Freq: Three times a day (TID) | ORAL | 6 refills | Status: DC
Start: 2020-08-11 — End: 2020-09-17

## 2020-08-12 DIAGNOSIS — Z791 Long term (current) use of non-steroidal anti-inflammatories (NSAID): Secondary | ICD-10-CM | POA: Diagnosis not present

## 2020-08-12 DIAGNOSIS — Z993 Dependence on wheelchair: Secondary | ICD-10-CM | POA: Diagnosis not present

## 2020-08-12 DIAGNOSIS — Z792 Long term (current) use of antibiotics: Secondary | ICD-10-CM | POA: Diagnosis not present

## 2020-08-12 DIAGNOSIS — Z9181 History of falling: Secondary | ICD-10-CM | POA: Diagnosis not present

## 2020-08-12 DIAGNOSIS — G834 Cauda equina syndrome: Secondary | ICD-10-CM | POA: Diagnosis not present

## 2020-08-12 DIAGNOSIS — G8250 Quadriplegia, unspecified: Secondary | ICD-10-CM | POA: Diagnosis not present

## 2020-08-12 DIAGNOSIS — Z466 Encounter for fitting and adjustment of urinary device: Secondary | ICD-10-CM | POA: Diagnosis not present

## 2020-08-13 DIAGNOSIS — Z466 Encounter for fitting and adjustment of urinary device: Secondary | ICD-10-CM | POA: Diagnosis not present

## 2020-08-13 DIAGNOSIS — Z791 Long term (current) use of non-steroidal anti-inflammatories (NSAID): Secondary | ICD-10-CM | POA: Diagnosis not present

## 2020-08-13 DIAGNOSIS — Z993 Dependence on wheelchair: Secondary | ICD-10-CM | POA: Diagnosis not present

## 2020-08-13 DIAGNOSIS — G8250 Quadriplegia, unspecified: Secondary | ICD-10-CM | POA: Diagnosis not present

## 2020-08-13 DIAGNOSIS — Z792 Long term (current) use of antibiotics: Secondary | ICD-10-CM | POA: Diagnosis not present

## 2020-08-13 DIAGNOSIS — Z9181 History of falling: Secondary | ICD-10-CM | POA: Diagnosis not present

## 2020-08-13 DIAGNOSIS — G834 Cauda equina syndrome: Secondary | ICD-10-CM | POA: Diagnosis not present

## 2020-08-15 DIAGNOSIS — Z791 Long term (current) use of non-steroidal anti-inflammatories (NSAID): Secondary | ICD-10-CM | POA: Diagnosis not present

## 2020-08-15 DIAGNOSIS — G834 Cauda equina syndrome: Secondary | ICD-10-CM | POA: Diagnosis not present

## 2020-08-15 DIAGNOSIS — Z792 Long term (current) use of antibiotics: Secondary | ICD-10-CM | POA: Diagnosis not present

## 2020-08-15 DIAGNOSIS — Z993 Dependence on wheelchair: Secondary | ICD-10-CM | POA: Diagnosis not present

## 2020-08-15 DIAGNOSIS — Z466 Encounter for fitting and adjustment of urinary device: Secondary | ICD-10-CM | POA: Diagnosis not present

## 2020-08-15 DIAGNOSIS — G8250 Quadriplegia, unspecified: Secondary | ICD-10-CM | POA: Diagnosis not present

## 2020-08-15 DIAGNOSIS — Z9181 History of falling: Secondary | ICD-10-CM | POA: Diagnosis not present

## 2020-08-18 DIAGNOSIS — Z993 Dependence on wheelchair: Secondary | ICD-10-CM | POA: Diagnosis not present

## 2020-08-18 DIAGNOSIS — Z9181 History of falling: Secondary | ICD-10-CM | POA: Diagnosis not present

## 2020-08-18 DIAGNOSIS — G8250 Quadriplegia, unspecified: Secondary | ICD-10-CM | POA: Diagnosis not present

## 2020-08-18 DIAGNOSIS — G834 Cauda equina syndrome: Secondary | ICD-10-CM | POA: Diagnosis not present

## 2020-08-18 DIAGNOSIS — Z791 Long term (current) use of non-steroidal anti-inflammatories (NSAID): Secondary | ICD-10-CM | POA: Diagnosis not present

## 2020-08-18 DIAGNOSIS — Z792 Long term (current) use of antibiotics: Secondary | ICD-10-CM | POA: Diagnosis not present

## 2020-08-18 DIAGNOSIS — Z466 Encounter for fitting and adjustment of urinary device: Secondary | ICD-10-CM | POA: Diagnosis not present

## 2020-08-20 DIAGNOSIS — Z993 Dependence on wheelchair: Secondary | ICD-10-CM | POA: Diagnosis not present

## 2020-08-20 DIAGNOSIS — Z791 Long term (current) use of non-steroidal anti-inflammatories (NSAID): Secondary | ICD-10-CM | POA: Diagnosis not present

## 2020-08-20 DIAGNOSIS — G834 Cauda equina syndrome: Secondary | ICD-10-CM | POA: Diagnosis not present

## 2020-08-20 DIAGNOSIS — Z9181 History of falling: Secondary | ICD-10-CM | POA: Diagnosis not present

## 2020-08-20 DIAGNOSIS — Z792 Long term (current) use of antibiotics: Secondary | ICD-10-CM | POA: Diagnosis not present

## 2020-08-20 DIAGNOSIS — Z466 Encounter for fitting and adjustment of urinary device: Secondary | ICD-10-CM | POA: Diagnosis not present

## 2020-08-20 DIAGNOSIS — G8250 Quadriplegia, unspecified: Secondary | ICD-10-CM | POA: Diagnosis not present

## 2020-08-22 DIAGNOSIS — G8250 Quadriplegia, unspecified: Secondary | ICD-10-CM | POA: Diagnosis not present

## 2020-08-22 DIAGNOSIS — Z466 Encounter for fitting and adjustment of urinary device: Secondary | ICD-10-CM | POA: Diagnosis not present

## 2020-08-22 DIAGNOSIS — Z993 Dependence on wheelchair: Secondary | ICD-10-CM | POA: Diagnosis not present

## 2020-08-22 DIAGNOSIS — Z792 Long term (current) use of antibiotics: Secondary | ICD-10-CM | POA: Diagnosis not present

## 2020-08-22 DIAGNOSIS — Z791 Long term (current) use of non-steroidal anti-inflammatories (NSAID): Secondary | ICD-10-CM | POA: Diagnosis not present

## 2020-08-22 DIAGNOSIS — Z9181 History of falling: Secondary | ICD-10-CM | POA: Diagnosis not present

## 2020-08-22 DIAGNOSIS — G834 Cauda equina syndrome: Secondary | ICD-10-CM | POA: Diagnosis not present

## 2020-08-25 DIAGNOSIS — G834 Cauda equina syndrome: Secondary | ICD-10-CM | POA: Diagnosis not present

## 2020-08-25 DIAGNOSIS — Z9181 History of falling: Secondary | ICD-10-CM | POA: Diagnosis not present

## 2020-08-25 DIAGNOSIS — G8250 Quadriplegia, unspecified: Secondary | ICD-10-CM | POA: Diagnosis not present

## 2020-08-25 DIAGNOSIS — Z466 Encounter for fitting and adjustment of urinary device: Secondary | ICD-10-CM | POA: Diagnosis not present

## 2020-08-25 DIAGNOSIS — Z792 Long term (current) use of antibiotics: Secondary | ICD-10-CM | POA: Diagnosis not present

## 2020-08-25 DIAGNOSIS — Z993 Dependence on wheelchair: Secondary | ICD-10-CM | POA: Diagnosis not present

## 2020-08-25 DIAGNOSIS — Z791 Long term (current) use of non-steroidal anti-inflammatories (NSAID): Secondary | ICD-10-CM | POA: Diagnosis not present

## 2020-08-27 DIAGNOSIS — Z466 Encounter for fitting and adjustment of urinary device: Secondary | ICD-10-CM | POA: Diagnosis not present

## 2020-08-27 DIAGNOSIS — G8250 Quadriplegia, unspecified: Secondary | ICD-10-CM | POA: Diagnosis not present

## 2020-08-27 DIAGNOSIS — Z9181 History of falling: Secondary | ICD-10-CM | POA: Diagnosis not present

## 2020-08-27 DIAGNOSIS — Z993 Dependence on wheelchair: Secondary | ICD-10-CM | POA: Diagnosis not present

## 2020-08-27 DIAGNOSIS — G834 Cauda equina syndrome: Secondary | ICD-10-CM | POA: Diagnosis not present

## 2020-08-27 DIAGNOSIS — Z791 Long term (current) use of non-steroidal anti-inflammatories (NSAID): Secondary | ICD-10-CM | POA: Diagnosis not present

## 2020-08-27 DIAGNOSIS — Z792 Long term (current) use of antibiotics: Secondary | ICD-10-CM | POA: Diagnosis not present

## 2020-08-29 DIAGNOSIS — G8250 Quadriplegia, unspecified: Secondary | ICD-10-CM | POA: Diagnosis not present

## 2020-08-29 DIAGNOSIS — Z993 Dependence on wheelchair: Secondary | ICD-10-CM | POA: Diagnosis not present

## 2020-08-29 DIAGNOSIS — Z791 Long term (current) use of non-steroidal anti-inflammatories (NSAID): Secondary | ICD-10-CM | POA: Diagnosis not present

## 2020-08-29 DIAGNOSIS — G834 Cauda equina syndrome: Secondary | ICD-10-CM | POA: Diagnosis not present

## 2020-08-29 DIAGNOSIS — Z9181 History of falling: Secondary | ICD-10-CM | POA: Diagnosis not present

## 2020-08-29 DIAGNOSIS — Z466 Encounter for fitting and adjustment of urinary device: Secondary | ICD-10-CM | POA: Diagnosis not present

## 2020-08-29 DIAGNOSIS — Z792 Long term (current) use of antibiotics: Secondary | ICD-10-CM | POA: Diagnosis not present

## 2020-09-01 DIAGNOSIS — Z9181 History of falling: Secondary | ICD-10-CM | POA: Diagnosis not present

## 2020-09-01 DIAGNOSIS — Z791 Long term (current) use of non-steroidal anti-inflammatories (NSAID): Secondary | ICD-10-CM | POA: Diagnosis not present

## 2020-09-01 DIAGNOSIS — Z993 Dependence on wheelchair: Secondary | ICD-10-CM | POA: Diagnosis not present

## 2020-09-01 DIAGNOSIS — Z792 Long term (current) use of antibiotics: Secondary | ICD-10-CM | POA: Diagnosis not present

## 2020-09-01 DIAGNOSIS — G834 Cauda equina syndrome: Secondary | ICD-10-CM | POA: Diagnosis not present

## 2020-09-01 DIAGNOSIS — G8250 Quadriplegia, unspecified: Secondary | ICD-10-CM | POA: Diagnosis not present

## 2020-09-01 DIAGNOSIS — Z466 Encounter for fitting and adjustment of urinary device: Secondary | ICD-10-CM | POA: Diagnosis not present

## 2020-09-02 DIAGNOSIS — Z791 Long term (current) use of non-steroidal anti-inflammatories (NSAID): Secondary | ICD-10-CM | POA: Diagnosis not present

## 2020-09-02 DIAGNOSIS — G834 Cauda equina syndrome: Secondary | ICD-10-CM | POA: Diagnosis not present

## 2020-09-02 DIAGNOSIS — Z792 Long term (current) use of antibiotics: Secondary | ICD-10-CM | POA: Diagnosis not present

## 2020-09-02 DIAGNOSIS — Z9181 History of falling: Secondary | ICD-10-CM | POA: Diagnosis not present

## 2020-09-02 DIAGNOSIS — Z466 Encounter for fitting and adjustment of urinary device: Secondary | ICD-10-CM | POA: Diagnosis not present

## 2020-09-02 DIAGNOSIS — Z993 Dependence on wheelchair: Secondary | ICD-10-CM | POA: Diagnosis not present

## 2020-09-02 DIAGNOSIS — G8250 Quadriplegia, unspecified: Secondary | ICD-10-CM | POA: Diagnosis not present

## 2020-09-04 DIAGNOSIS — G8250 Quadriplegia, unspecified: Secondary | ICD-10-CM | POA: Diagnosis not present

## 2020-09-04 DIAGNOSIS — Z87440 Personal history of urinary (tract) infections: Secondary | ICD-10-CM | POA: Diagnosis not present

## 2020-09-04 DIAGNOSIS — R339 Retention of urine, unspecified: Secondary | ICD-10-CM | POA: Diagnosis not present

## 2020-09-09 DIAGNOSIS — G8250 Quadriplegia, unspecified: Secondary | ICD-10-CM | POA: Diagnosis not present

## 2020-09-09 DIAGNOSIS — Z87440 Personal history of urinary (tract) infections: Secondary | ICD-10-CM | POA: Diagnosis not present

## 2020-09-09 DIAGNOSIS — Z791 Long term (current) use of non-steroidal anti-inflammatories (NSAID): Secondary | ICD-10-CM | POA: Diagnosis not present

## 2020-09-09 DIAGNOSIS — G834 Cauda equina syndrome: Secondary | ICD-10-CM | POA: Diagnosis not present

## 2020-09-09 DIAGNOSIS — Z993 Dependence on wheelchair: Secondary | ICD-10-CM | POA: Diagnosis not present

## 2020-09-09 DIAGNOSIS — Z466 Encounter for fitting and adjustment of urinary device: Secondary | ICD-10-CM | POA: Diagnosis not present

## 2020-09-09 DIAGNOSIS — Z87891 Personal history of nicotine dependence: Secondary | ICD-10-CM | POA: Diagnosis not present

## 2020-09-09 DIAGNOSIS — Z792 Long term (current) use of antibiotics: Secondary | ICD-10-CM | POA: Diagnosis not present

## 2020-09-09 DIAGNOSIS — Z9181 History of falling: Secondary | ICD-10-CM | POA: Diagnosis not present

## 2020-09-11 DIAGNOSIS — Z87440 Personal history of urinary (tract) infections: Secondary | ICD-10-CM | POA: Diagnosis not present

## 2020-09-11 DIAGNOSIS — Z9181 History of falling: Secondary | ICD-10-CM | POA: Diagnosis not present

## 2020-09-11 DIAGNOSIS — Z993 Dependence on wheelchair: Secondary | ICD-10-CM | POA: Diagnosis not present

## 2020-09-11 DIAGNOSIS — Z791 Long term (current) use of non-steroidal anti-inflammatories (NSAID): Secondary | ICD-10-CM | POA: Diagnosis not present

## 2020-09-11 DIAGNOSIS — G834 Cauda equina syndrome: Secondary | ICD-10-CM | POA: Diagnosis not present

## 2020-09-11 DIAGNOSIS — G8250 Quadriplegia, unspecified: Secondary | ICD-10-CM | POA: Diagnosis not present

## 2020-09-11 DIAGNOSIS — Z87891 Personal history of nicotine dependence: Secondary | ICD-10-CM | POA: Diagnosis not present

## 2020-09-11 DIAGNOSIS — Z792 Long term (current) use of antibiotics: Secondary | ICD-10-CM | POA: Diagnosis not present

## 2020-09-11 DIAGNOSIS — Z466 Encounter for fitting and adjustment of urinary device: Secondary | ICD-10-CM | POA: Diagnosis not present

## 2020-09-12 DIAGNOSIS — Z792 Long term (current) use of antibiotics: Secondary | ICD-10-CM | POA: Diagnosis not present

## 2020-09-12 DIAGNOSIS — Z87891 Personal history of nicotine dependence: Secondary | ICD-10-CM | POA: Diagnosis not present

## 2020-09-12 DIAGNOSIS — Z466 Encounter for fitting and adjustment of urinary device: Secondary | ICD-10-CM | POA: Diagnosis not present

## 2020-09-12 DIAGNOSIS — Z791 Long term (current) use of non-steroidal anti-inflammatories (NSAID): Secondary | ICD-10-CM | POA: Diagnosis not present

## 2020-09-12 DIAGNOSIS — G834 Cauda equina syndrome: Secondary | ICD-10-CM | POA: Diagnosis not present

## 2020-09-12 DIAGNOSIS — G8250 Quadriplegia, unspecified: Secondary | ICD-10-CM | POA: Diagnosis not present

## 2020-09-12 DIAGNOSIS — Z993 Dependence on wheelchair: Secondary | ICD-10-CM | POA: Diagnosis not present

## 2020-09-12 DIAGNOSIS — Z9181 History of falling: Secondary | ICD-10-CM | POA: Diagnosis not present

## 2020-09-12 DIAGNOSIS — Z87440 Personal history of urinary (tract) infections: Secondary | ICD-10-CM | POA: Diagnosis not present

## 2020-09-15 DIAGNOSIS — G834 Cauda equina syndrome: Secondary | ICD-10-CM | POA: Diagnosis not present

## 2020-09-15 DIAGNOSIS — Z466 Encounter for fitting and adjustment of urinary device: Secondary | ICD-10-CM | POA: Diagnosis not present

## 2020-09-15 DIAGNOSIS — Z993 Dependence on wheelchair: Secondary | ICD-10-CM | POA: Diagnosis not present

## 2020-09-15 DIAGNOSIS — Z9181 History of falling: Secondary | ICD-10-CM | POA: Diagnosis not present

## 2020-09-15 DIAGNOSIS — Z791 Long term (current) use of non-steroidal anti-inflammatories (NSAID): Secondary | ICD-10-CM | POA: Diagnosis not present

## 2020-09-15 DIAGNOSIS — G8250 Quadriplegia, unspecified: Secondary | ICD-10-CM | POA: Diagnosis not present

## 2020-09-15 DIAGNOSIS — Z87440 Personal history of urinary (tract) infections: Secondary | ICD-10-CM | POA: Diagnosis not present

## 2020-09-15 DIAGNOSIS — Z792 Long term (current) use of antibiotics: Secondary | ICD-10-CM | POA: Diagnosis not present

## 2020-09-15 DIAGNOSIS — Z87891 Personal history of nicotine dependence: Secondary | ICD-10-CM | POA: Diagnosis not present

## 2020-09-17 ENCOUNTER — Ambulatory Visit (INDEPENDENT_AMBULATORY_CARE_PROVIDER_SITE_OTHER): Payer: Medicare Other | Admitting: Internal Medicine

## 2020-09-17 ENCOUNTER — Encounter: Payer: Self-pay | Admitting: Internal Medicine

## 2020-09-17 ENCOUNTER — Other Ambulatory Visit: Payer: Self-pay

## 2020-09-17 VITALS — BP 110/68 | HR 72

## 2020-09-17 DIAGNOSIS — Z87440 Personal history of urinary (tract) infections: Secondary | ICD-10-CM | POA: Diagnosis not present

## 2020-09-17 DIAGNOSIS — R32 Unspecified urinary incontinence: Secondary | ICD-10-CM

## 2020-09-17 DIAGNOSIS — Z791 Long term (current) use of non-steroidal anti-inflammatories (NSAID): Secondary | ICD-10-CM | POA: Diagnosis not present

## 2020-09-17 DIAGNOSIS — Z993 Dependence on wheelchair: Secondary | ICD-10-CM | POA: Diagnosis not present

## 2020-09-17 DIAGNOSIS — Z1231 Encounter for screening mammogram for malignant neoplasm of breast: Secondary | ICD-10-CM

## 2020-09-17 DIAGNOSIS — Z1211 Encounter for screening for malignant neoplasm of colon: Secondary | ICD-10-CM

## 2020-09-17 DIAGNOSIS — G8250 Quadriplegia, unspecified: Secondary | ICD-10-CM

## 2020-09-17 DIAGNOSIS — Z87891 Personal history of nicotine dependence: Secondary | ICD-10-CM | POA: Diagnosis not present

## 2020-09-17 DIAGNOSIS — Z792 Long term (current) use of antibiotics: Secondary | ICD-10-CM | POA: Diagnosis not present

## 2020-09-17 DIAGNOSIS — G834 Cauda equina syndrome: Secondary | ICD-10-CM | POA: Diagnosis not present

## 2020-09-17 DIAGNOSIS — Z466 Encounter for fitting and adjustment of urinary device: Secondary | ICD-10-CM | POA: Diagnosis not present

## 2020-09-17 DIAGNOSIS — Z9181 History of falling: Secondary | ICD-10-CM | POA: Diagnosis not present

## 2020-09-17 MED ORDER — BACLOFEN 10 MG PO TABS: 10.0000 mg | ORAL_TABLET | Freq: Three times a day (TID) | ORAL | 6 refills | Status: DC

## 2020-09-17 NOTE — Assessment & Plan Note (Signed)
Patient is dependent on Foley catheter, sediment is clear

## 2020-09-17 NOTE — Progress Notes (Signed)
Established Patient Office Visit  Subjective:  Patient ID: Heidi Hess, female    DOB: 20-Jun-1960  Age: 60 y.o. MRN: [id number]  CC:  Chief Complaint  Patient presents with   Follow-up    HPI  Heidi Hess presents for regular check up  History reviewed. No pertinent past medical history.  Past Surgical History:  Procedure Laterality Date   ROBOTIC ASSISTED SUPRACERVICAL HYSTERECTOMY WITH BILATERAL SALPINGO OOPHERECTOMY Bilateral 03/30/2018   Uterus, cervix, bilateral fallopian tubes and ovaries, hysterectomy and bilateral salpingo-oophorectomy    Family History  Problem Relation Age of Onset   Breast cancer Paternal Aunt    Breast cancer Cousin        maternal    Social History   Socioeconomic History   Marital status: Single    Spouse name: Not on file   Number of children: Not on file   Years of education: Not on file   Highest education level: Not on file  Occupational History   Not on file  Tobacco Use   Smoking status: Former    Pack years: 0.00    Types: Cigarettes    Quit date: 2010    Years since quitting: 12.5   Smokeless tobacco: Never  Substance and Sexual Activity   Alcohol use: Not on file   Drug use: Not on file   Sexual activity: Not on file  Other Topics Concern   Not on file  Social History Narrative   Not on file   Social Determinants of Health   Financial Resource Strain: Not on file  Food Insecurity: Not on file  Transportation Needs: Not on file  Physical Activity: Not on file  Stress: Not on file  Social Connections: Not on file  Intimate Partner Violence: Not on file     Current Outpatient Medications:    Acetaminophen 325 MG CAPS, , Disp: , Rfl:    Ascorbic Acid (VITAMIN C) 500 MG CHEW, Chew 500 mg by mouth daily., Disp: , Rfl:    bisacodyl (DULCOLAX) 10 MG suppository, Place 10 mg rectally daily., Disp: , Rfl:    Calcium Carb-Cholecalciferol (OYSTER SHELL CALCIUM) 500-400 MG-UNIT TABS, , Disp: , Rfl:     Control Gel Formula Dressing (DUODERM CGF EXTRA THIN) MISC, Apply 1 each topically once a week., Disp: 4 each, Rfl: 0   Docusate Sodium (DSS) 250 MG CAPS, , Disp: , Rfl:    fluconazole (DIFLUCAN) 150 MG tablet, Take 1 tablet (150 mg total) by mouth daily., Disp: 30 tablet, Rfl: 3   loratadine (CLARITIN) 10 MG tablet, Take 10 mg by mouth daily., Disp: , Rfl:    Melatonin 1 MG CAPS, Take 1 capsule by mouth at bedtime as needed., Disp: , Rfl:    Multiple Vitamins-Minerals (CENTRUM ADULTS) TABS, , Disp: , Rfl:    nitrofurantoin (MACRODANTIN) 50 MG capsule, Take 1 capsule (50 mg total) by mouth 2 (two) times daily., Disp: 180 capsule, Rfl: 3   nystatin ointment (MYCOSTATIN), Apply 30 g topically daily., Disp: , Rfl:    Olopatadine HCl 0.2 % SOLN, , Disp: , Rfl:    oxybutynin (DITROPAN XL) 15 MG 24 hr tablet, Take 1 tablet (15 mg total) by mouth daily., Disp: 90 tablet, Rfl: 6   Simethicone 180 MG CAPS, Take 180 mg by mouth daily., Disp: , Rfl:    Vitamin D, Cholecalciferol, 25 MCG (1000 UT) TABS, Take 5,000 Units by mouth daily at 2 PM., Disp: , Rfl:    baclofen (LIORESAL) 10  MG tablet, Take 1 tablet (10 mg total) by mouth 3 (three) times daily., Disp: 90 each, Rfl: 6   Allergies  Allergen Reactions   Latex Rash   Nickel Dermatitis, Itching and Rash   Sulfa Antibiotics Itching, Nausea Only and Rash    ROS Review of Systems  Constitutional: Negative.   HENT: Negative.    Eyes: Negative.   Respiratory: Negative.  Negative for chest tightness.   Cardiovascular: Negative.   Gastrointestinal: Negative.   Endocrine: Negative.   Genitourinary:  Positive for difficulty urinating.       Patient is incontinent with history of bowel and has a Foley catheter  Musculoskeletal: Negative.   Skin: Negative.   Allergic/Immunologic: Negative.   Neurological:        Patient is quadriplegic wheelchair-bound  Hematological: Negative.   Psychiatric/Behavioral: Negative.    All other systems reviewed and  are negative.    Objective:    Physical Exam Vitals reviewed.  Constitutional:      Appearance: Normal appearance.  HENT:     Mouth/Throat:     Mouth: Mucous membranes are moist.  Eyes:     Pupils: Pupils are equal, round, and reactive to light.  Neck:     Vascular: No carotid bruit.  Cardiovascular:     Rate and Rhythm: Normal rate and regular rhythm.  Pulmonary:     Effort: Pulmonary effort is normal.     Breath sounds: Normal breath sounds.  Abdominal:     General: Bowel sounds are normal.     Palpations: Abdomen is soft. There is no hepatomegaly, splenomegaly or mass.     Tenderness: There is no abdominal tenderness.     Hernia: No hernia is present.  Musculoskeletal:        General: No tenderness.     Cervical back: Neck supple.     Right lower leg: No edema.     Left lower leg: No edema.  Skin:    Findings: No rash.  Neurological:     Mental Status: She is alert and oriented to person, place, and time. Mental status is at baseline.     Motor: No weakness.     Comments: Patient is wheelchair-bound  Psychiatric:        Mood and Affect: Mood and affect normal.        Behavior: Behavior normal.    BP 110/68   Pulse 72  Wt Readings from Last 3 Encounters:  06/23/20 170 lb (77.1 kg)  12/19/19 170 lb (77.1 kg)     Health Maintenance Due  Topic Date Due   Pneumococcal Vaccine 5-28 Years old (1 - PCV) Never done   HIV Screening  Never done   Hepatitis C Screening  Never done   Zoster Vaccines- Shingrix (1 of 2) Never done   COVID-19 Vaccine (4 - Booster for Pfizer series) 08/12/2020    There are no preventive care reminders to display for this patient.  Lab Results  Component Value Date   TSH 1.64 12/19/2019   Lab Results  Component Value Date   WBC 5.5 12/19/2019   HGB 13.8 12/19/2019   HCT 41.9 12/19/2019   MCV 89.9 12/19/2019   PLT 203 12/19/2019   Lab Results  Component Value Date   NA 137 12/19/2019   K 3.8 12/19/2019   CO2 25 12/19/2019    GLUCOSE 84 12/19/2019   BUN 16 12/19/2019   CREATININE 0.47 (L) 12/19/2019   BILITOT 0.5 12/19/2019   AST 16  12/19/2019   ALT 11 12/19/2019   PROT 7.1 12/19/2019   CALCIUM 9.0 12/19/2019   Lab Results  Component Value Date   CHOL 189 12/19/2019   Lab Results  Component Value Date   HDL 42 (L) 12/19/2019   Lab Results  Component Value Date   LDLCALC 126 (H) 12/19/2019   Lab Results  Component Value Date   TRIG 104 12/19/2019   Lab Results  Component Value Date   CHOLHDL 4.5 12/19/2019   No results found for: HGBA1C    Assessment & Plan:   Problem List Items Addressed This Visit       Nervous and Auditory   Quadriplegia and quadriparesis ([place])    Patient is at baseline.         Other   Incontinence in female    Patient is dependent on Foley catheter, sediment is clear       Screening for colon cancer - Primary    Cologuard test was recommended to the patient       Relevant Orders   Ambulatory referral to Gastroenterology    Meds ordered this encounter  Medications   baclofen (LIORESAL) 10 MG tablet    Sig: Take 1 tablet (10 mg total) by mouth 3 (three) times daily.    Dispense:  90 each    Refill:  6  Patient was suggested annual mammogram  Follow-up: No follow-ups on file.    [name], MD

## 2020-09-17 NOTE — Assessment & Plan Note (Signed)
Patient is at baseline.

## 2020-09-17 NOTE — Assessment & Plan Note (Signed)
Cologuard test was recommended to the patient

## 2020-09-18 ENCOUNTER — Encounter: Payer: Self-pay | Admitting: *Deleted

## 2020-09-18 DIAGNOSIS — Z466 Encounter for fitting and adjustment of urinary device: Secondary | ICD-10-CM | POA: Diagnosis not present

## 2020-09-18 DIAGNOSIS — G834 Cauda equina syndrome: Secondary | ICD-10-CM | POA: Diagnosis not present

## 2020-09-18 DIAGNOSIS — Z792 Long term (current) use of antibiotics: Secondary | ICD-10-CM | POA: Diagnosis not present

## 2020-09-18 DIAGNOSIS — Z993 Dependence on wheelchair: Secondary | ICD-10-CM | POA: Diagnosis not present

## 2020-09-18 DIAGNOSIS — Z791 Long term (current) use of non-steroidal anti-inflammatories (NSAID): Secondary | ICD-10-CM | POA: Diagnosis not present

## 2020-09-18 DIAGNOSIS — Z9181 History of falling: Secondary | ICD-10-CM | POA: Diagnosis not present

## 2020-09-18 DIAGNOSIS — G8250 Quadriplegia, unspecified: Secondary | ICD-10-CM | POA: Diagnosis not present

## 2020-09-18 DIAGNOSIS — Z87440 Personal history of urinary (tract) infections: Secondary | ICD-10-CM | POA: Diagnosis not present

## 2020-09-18 DIAGNOSIS — Z87891 Personal history of nicotine dependence: Secondary | ICD-10-CM | POA: Diagnosis not present

## 2020-09-19 DIAGNOSIS — G8250 Quadriplegia, unspecified: Secondary | ICD-10-CM | POA: Diagnosis not present

## 2020-09-19 DIAGNOSIS — Z791 Long term (current) use of non-steroidal anti-inflammatories (NSAID): Secondary | ICD-10-CM | POA: Diagnosis not present

## 2020-09-19 DIAGNOSIS — Z792 Long term (current) use of antibiotics: Secondary | ICD-10-CM | POA: Diagnosis not present

## 2020-09-19 DIAGNOSIS — Z466 Encounter for fitting and adjustment of urinary device: Secondary | ICD-10-CM | POA: Diagnosis not present

## 2020-09-19 DIAGNOSIS — Z87440 Personal history of urinary (tract) infections: Secondary | ICD-10-CM | POA: Diagnosis not present

## 2020-09-19 DIAGNOSIS — Z87891 Personal history of nicotine dependence: Secondary | ICD-10-CM | POA: Diagnosis not present

## 2020-09-19 DIAGNOSIS — Z993 Dependence on wheelchair: Secondary | ICD-10-CM | POA: Diagnosis not present

## 2020-09-19 DIAGNOSIS — Z9181 History of falling: Secondary | ICD-10-CM | POA: Diagnosis not present

## 2020-09-19 DIAGNOSIS — G834 Cauda equina syndrome: Secondary | ICD-10-CM | POA: Diagnosis not present

## 2020-09-23 DIAGNOSIS — Z87891 Personal history of nicotine dependence: Secondary | ICD-10-CM | POA: Diagnosis not present

## 2020-09-23 DIAGNOSIS — Z993 Dependence on wheelchair: Secondary | ICD-10-CM | POA: Diagnosis not present

## 2020-09-23 DIAGNOSIS — G834 Cauda equina syndrome: Secondary | ICD-10-CM | POA: Diagnosis not present

## 2020-09-23 DIAGNOSIS — G8250 Quadriplegia, unspecified: Secondary | ICD-10-CM | POA: Diagnosis not present

## 2020-09-23 DIAGNOSIS — Z791 Long term (current) use of non-steroidal anti-inflammatories (NSAID): Secondary | ICD-10-CM | POA: Diagnosis not present

## 2020-09-23 DIAGNOSIS — Z87440 Personal history of urinary (tract) infections: Secondary | ICD-10-CM | POA: Diagnosis not present

## 2020-09-23 DIAGNOSIS — Z9181 History of falling: Secondary | ICD-10-CM | POA: Diagnosis not present

## 2020-09-23 DIAGNOSIS — Z466 Encounter for fitting and adjustment of urinary device: Secondary | ICD-10-CM | POA: Diagnosis not present

## 2020-09-23 DIAGNOSIS — Z792 Long term (current) use of antibiotics: Secondary | ICD-10-CM | POA: Diagnosis not present

## 2020-09-24 DIAGNOSIS — Z9181 History of falling: Secondary | ICD-10-CM | POA: Diagnosis not present

## 2020-09-24 DIAGNOSIS — Z792 Long term (current) use of antibiotics: Secondary | ICD-10-CM | POA: Diagnosis not present

## 2020-09-24 DIAGNOSIS — Z87440 Personal history of urinary (tract) infections: Secondary | ICD-10-CM | POA: Diagnosis not present

## 2020-09-24 DIAGNOSIS — Z993 Dependence on wheelchair: Secondary | ICD-10-CM | POA: Diagnosis not present

## 2020-09-24 DIAGNOSIS — Z87891 Personal history of nicotine dependence: Secondary | ICD-10-CM | POA: Diagnosis not present

## 2020-09-24 DIAGNOSIS — G834 Cauda equina syndrome: Secondary | ICD-10-CM | POA: Diagnosis not present

## 2020-09-24 DIAGNOSIS — Z791 Long term (current) use of non-steroidal anti-inflammatories (NSAID): Secondary | ICD-10-CM | POA: Diagnosis not present

## 2020-09-24 DIAGNOSIS — Z466 Encounter for fitting and adjustment of urinary device: Secondary | ICD-10-CM | POA: Diagnosis not present

## 2020-09-24 DIAGNOSIS — G8250 Quadriplegia, unspecified: Secondary | ICD-10-CM | POA: Diagnosis not present

## 2020-09-25 DIAGNOSIS — Z792 Long term (current) use of antibiotics: Secondary | ICD-10-CM | POA: Diagnosis not present

## 2020-09-25 DIAGNOSIS — Z791 Long term (current) use of non-steroidal anti-inflammatories (NSAID): Secondary | ICD-10-CM | POA: Diagnosis not present

## 2020-09-25 DIAGNOSIS — Z9181 History of falling: Secondary | ICD-10-CM | POA: Diagnosis not present

## 2020-09-25 DIAGNOSIS — G8250 Quadriplegia, unspecified: Secondary | ICD-10-CM | POA: Diagnosis not present

## 2020-09-25 DIAGNOSIS — G834 Cauda equina syndrome: Secondary | ICD-10-CM | POA: Diagnosis not present

## 2020-09-25 DIAGNOSIS — Z87891 Personal history of nicotine dependence: Secondary | ICD-10-CM | POA: Diagnosis not present

## 2020-09-25 DIAGNOSIS — Z87440 Personal history of urinary (tract) infections: Secondary | ICD-10-CM | POA: Diagnosis not present

## 2020-09-25 DIAGNOSIS — Z993 Dependence on wheelchair: Secondary | ICD-10-CM | POA: Diagnosis not present

## 2020-09-25 DIAGNOSIS — Z466 Encounter for fitting and adjustment of urinary device: Secondary | ICD-10-CM | POA: Diagnosis not present

## 2020-09-26 DIAGNOSIS — Z87440 Personal history of urinary (tract) infections: Secondary | ICD-10-CM | POA: Diagnosis not present

## 2020-09-26 DIAGNOSIS — G834 Cauda equina syndrome: Secondary | ICD-10-CM | POA: Diagnosis not present

## 2020-09-26 DIAGNOSIS — Z466 Encounter for fitting and adjustment of urinary device: Secondary | ICD-10-CM | POA: Diagnosis not present

## 2020-09-26 DIAGNOSIS — Z9181 History of falling: Secondary | ICD-10-CM | POA: Diagnosis not present

## 2020-09-26 DIAGNOSIS — G8250 Quadriplegia, unspecified: Secondary | ICD-10-CM | POA: Diagnosis not present

## 2020-09-26 DIAGNOSIS — Z87891 Personal history of nicotine dependence: Secondary | ICD-10-CM | POA: Diagnosis not present

## 2020-09-26 DIAGNOSIS — Z791 Long term (current) use of non-steroidal anti-inflammatories (NSAID): Secondary | ICD-10-CM | POA: Diagnosis not present

## 2020-09-26 DIAGNOSIS — Z792 Long term (current) use of antibiotics: Secondary | ICD-10-CM | POA: Diagnosis not present

## 2020-09-26 DIAGNOSIS — Z993 Dependence on wheelchair: Secondary | ICD-10-CM | POA: Diagnosis not present

## 2020-09-29 DIAGNOSIS — Z87440 Personal history of urinary (tract) infections: Secondary | ICD-10-CM | POA: Diagnosis not present

## 2020-09-29 DIAGNOSIS — Z87891 Personal history of nicotine dependence: Secondary | ICD-10-CM | POA: Diagnosis not present

## 2020-09-29 DIAGNOSIS — Z9181 History of falling: Secondary | ICD-10-CM | POA: Diagnosis not present

## 2020-09-29 DIAGNOSIS — G8250 Quadriplegia, unspecified: Secondary | ICD-10-CM | POA: Diagnosis not present

## 2020-09-29 DIAGNOSIS — Z792 Long term (current) use of antibiotics: Secondary | ICD-10-CM | POA: Diagnosis not present

## 2020-09-29 DIAGNOSIS — Z466 Encounter for fitting and adjustment of urinary device: Secondary | ICD-10-CM | POA: Diagnosis not present

## 2020-09-29 DIAGNOSIS — G834 Cauda equina syndrome: Secondary | ICD-10-CM | POA: Diagnosis not present

## 2020-09-29 DIAGNOSIS — Z993 Dependence on wheelchair: Secondary | ICD-10-CM | POA: Diagnosis not present

## 2020-09-29 DIAGNOSIS — Z791 Long term (current) use of non-steroidal anti-inflammatories (NSAID): Secondary | ICD-10-CM | POA: Diagnosis not present

## 2020-10-01 DIAGNOSIS — G8250 Quadriplegia, unspecified: Secondary | ICD-10-CM | POA: Diagnosis not present

## 2020-10-01 DIAGNOSIS — Z466 Encounter for fitting and adjustment of urinary device: Secondary | ICD-10-CM | POA: Diagnosis not present

## 2020-10-01 DIAGNOSIS — Z87891 Personal history of nicotine dependence: Secondary | ICD-10-CM | POA: Diagnosis not present

## 2020-10-01 DIAGNOSIS — Z87440 Personal history of urinary (tract) infections: Secondary | ICD-10-CM | POA: Diagnosis not present

## 2020-10-01 DIAGNOSIS — Z993 Dependence on wheelchair: Secondary | ICD-10-CM | POA: Diagnosis not present

## 2020-10-01 DIAGNOSIS — Z9181 History of falling: Secondary | ICD-10-CM | POA: Diagnosis not present

## 2020-10-01 DIAGNOSIS — G834 Cauda equina syndrome: Secondary | ICD-10-CM | POA: Diagnosis not present

## 2020-10-01 DIAGNOSIS — Z791 Long term (current) use of non-steroidal anti-inflammatories (NSAID): Secondary | ICD-10-CM | POA: Diagnosis not present

## 2020-10-01 DIAGNOSIS — Z792 Long term (current) use of antibiotics: Secondary | ICD-10-CM | POA: Diagnosis not present

## 2020-10-03 DIAGNOSIS — Z791 Long term (current) use of non-steroidal anti-inflammatories (NSAID): Secondary | ICD-10-CM | POA: Diagnosis not present

## 2020-10-03 DIAGNOSIS — G834 Cauda equina syndrome: Secondary | ICD-10-CM | POA: Diagnosis not present

## 2020-10-03 DIAGNOSIS — Z466 Encounter for fitting and adjustment of urinary device: Secondary | ICD-10-CM | POA: Diagnosis not present

## 2020-10-03 DIAGNOSIS — Z87891 Personal history of nicotine dependence: Secondary | ICD-10-CM | POA: Diagnosis not present

## 2020-10-03 DIAGNOSIS — G8250 Quadriplegia, unspecified: Secondary | ICD-10-CM | POA: Diagnosis not present

## 2020-10-03 DIAGNOSIS — Z9181 History of falling: Secondary | ICD-10-CM | POA: Diagnosis not present

## 2020-10-03 DIAGNOSIS — Z993 Dependence on wheelchair: Secondary | ICD-10-CM | POA: Diagnosis not present

## 2020-10-03 DIAGNOSIS — Z87440 Personal history of urinary (tract) infections: Secondary | ICD-10-CM | POA: Diagnosis not present

## 2020-10-03 DIAGNOSIS — Z792 Long term (current) use of antibiotics: Secondary | ICD-10-CM | POA: Diagnosis not present

## 2020-10-06 DIAGNOSIS — R339 Retention of urine, unspecified: Secondary | ICD-10-CM | POA: Diagnosis not present

## 2020-10-06 DIAGNOSIS — Z9181 History of falling: Secondary | ICD-10-CM | POA: Diagnosis not present

## 2020-10-06 DIAGNOSIS — Z993 Dependence on wheelchair: Secondary | ICD-10-CM | POA: Diagnosis not present

## 2020-10-06 DIAGNOSIS — Z87891 Personal history of nicotine dependence: Secondary | ICD-10-CM | POA: Diagnosis not present

## 2020-10-06 DIAGNOSIS — Z466 Encounter for fitting and adjustment of urinary device: Secondary | ICD-10-CM | POA: Diagnosis not present

## 2020-10-06 DIAGNOSIS — Z791 Long term (current) use of non-steroidal anti-inflammatories (NSAID): Secondary | ICD-10-CM | POA: Diagnosis not present

## 2020-10-06 DIAGNOSIS — Z87440 Personal history of urinary (tract) infections: Secondary | ICD-10-CM | POA: Diagnosis not present

## 2020-10-06 DIAGNOSIS — G834 Cauda equina syndrome: Secondary | ICD-10-CM | POA: Diagnosis not present

## 2020-10-06 DIAGNOSIS — Z792 Long term (current) use of antibiotics: Secondary | ICD-10-CM | POA: Diagnosis not present

## 2020-10-06 DIAGNOSIS — G8250 Quadriplegia, unspecified: Secondary | ICD-10-CM | POA: Diagnosis not present

## 2020-10-07 DIAGNOSIS — Z87440 Personal history of urinary (tract) infections: Secondary | ICD-10-CM | POA: Diagnosis not present

## 2020-10-07 DIAGNOSIS — G834 Cauda equina syndrome: Secondary | ICD-10-CM | POA: Diagnosis not present

## 2020-10-07 DIAGNOSIS — Z87891 Personal history of nicotine dependence: Secondary | ICD-10-CM | POA: Diagnosis not present

## 2020-10-07 DIAGNOSIS — Z993 Dependence on wheelchair: Secondary | ICD-10-CM | POA: Diagnosis not present

## 2020-10-07 DIAGNOSIS — Z9181 History of falling: Secondary | ICD-10-CM | POA: Diagnosis not present

## 2020-10-07 DIAGNOSIS — Z792 Long term (current) use of antibiotics: Secondary | ICD-10-CM | POA: Diagnosis not present

## 2020-10-07 DIAGNOSIS — Z791 Long term (current) use of non-steroidal anti-inflammatories (NSAID): Secondary | ICD-10-CM | POA: Diagnosis not present

## 2020-10-07 DIAGNOSIS — G8250 Quadriplegia, unspecified: Secondary | ICD-10-CM | POA: Diagnosis not present

## 2020-10-07 DIAGNOSIS — Z466 Encounter for fitting and adjustment of urinary device: Secondary | ICD-10-CM | POA: Diagnosis not present

## 2020-10-08 DIAGNOSIS — Z993 Dependence on wheelchair: Secondary | ICD-10-CM | POA: Diagnosis not present

## 2020-10-08 DIAGNOSIS — Z466 Encounter for fitting and adjustment of urinary device: Secondary | ICD-10-CM | POA: Diagnosis not present

## 2020-10-08 DIAGNOSIS — G834 Cauda equina syndrome: Secondary | ICD-10-CM | POA: Diagnosis not present

## 2020-10-08 DIAGNOSIS — Z87440 Personal history of urinary (tract) infections: Secondary | ICD-10-CM | POA: Diagnosis not present

## 2020-10-08 DIAGNOSIS — I739 Peripheral vascular disease, unspecified: Secondary | ICD-10-CM | POA: Diagnosis not present

## 2020-10-08 DIAGNOSIS — B351 Tinea unguium: Secondary | ICD-10-CM | POA: Diagnosis not present

## 2020-10-08 DIAGNOSIS — Z792 Long term (current) use of antibiotics: Secondary | ICD-10-CM | POA: Diagnosis not present

## 2020-10-08 DIAGNOSIS — Z791 Long term (current) use of non-steroidal anti-inflammatories (NSAID): Secondary | ICD-10-CM | POA: Diagnosis not present

## 2020-10-08 DIAGNOSIS — Z9181 History of falling: Secondary | ICD-10-CM | POA: Diagnosis not present

## 2020-10-08 DIAGNOSIS — G8250 Quadriplegia, unspecified: Secondary | ICD-10-CM | POA: Diagnosis not present

## 2020-10-08 DIAGNOSIS — Z87891 Personal history of nicotine dependence: Secondary | ICD-10-CM | POA: Diagnosis not present

## 2020-10-10 DIAGNOSIS — Z466 Encounter for fitting and adjustment of urinary device: Secondary | ICD-10-CM | POA: Diagnosis not present

## 2020-10-10 DIAGNOSIS — Z9181 History of falling: Secondary | ICD-10-CM | POA: Diagnosis not present

## 2020-10-10 DIAGNOSIS — G8250 Quadriplegia, unspecified: Secondary | ICD-10-CM | POA: Diagnosis not present

## 2020-10-10 DIAGNOSIS — Z791 Long term (current) use of non-steroidal anti-inflammatories (NSAID): Secondary | ICD-10-CM | POA: Diagnosis not present

## 2020-10-10 DIAGNOSIS — Z792 Long term (current) use of antibiotics: Secondary | ICD-10-CM | POA: Diagnosis not present

## 2020-10-10 DIAGNOSIS — Z993 Dependence on wheelchair: Secondary | ICD-10-CM | POA: Diagnosis not present

## 2020-10-10 DIAGNOSIS — Z87440 Personal history of urinary (tract) infections: Secondary | ICD-10-CM | POA: Diagnosis not present

## 2020-10-10 DIAGNOSIS — G834 Cauda equina syndrome: Secondary | ICD-10-CM | POA: Diagnosis not present

## 2020-10-10 DIAGNOSIS — Z87891 Personal history of nicotine dependence: Secondary | ICD-10-CM | POA: Diagnosis not present

## 2020-10-13 DIAGNOSIS — G834 Cauda equina syndrome: Secondary | ICD-10-CM | POA: Diagnosis not present

## 2020-10-13 DIAGNOSIS — Z993 Dependence on wheelchair: Secondary | ICD-10-CM | POA: Diagnosis not present

## 2020-10-13 DIAGNOSIS — Z466 Encounter for fitting and adjustment of urinary device: Secondary | ICD-10-CM | POA: Diagnosis not present

## 2020-10-13 DIAGNOSIS — G8250 Quadriplegia, unspecified: Secondary | ICD-10-CM | POA: Diagnosis not present

## 2020-10-13 DIAGNOSIS — Z87891 Personal history of nicotine dependence: Secondary | ICD-10-CM | POA: Diagnosis not present

## 2020-10-13 DIAGNOSIS — Z792 Long term (current) use of antibiotics: Secondary | ICD-10-CM | POA: Diagnosis not present

## 2020-10-13 DIAGNOSIS — Z87440 Personal history of urinary (tract) infections: Secondary | ICD-10-CM | POA: Diagnosis not present

## 2020-10-13 DIAGNOSIS — Z791 Long term (current) use of non-steroidal anti-inflammatories (NSAID): Secondary | ICD-10-CM | POA: Diagnosis not present

## 2020-10-13 DIAGNOSIS — Z9181 History of falling: Secondary | ICD-10-CM | POA: Diagnosis not present

## 2020-10-15 DIAGNOSIS — Z466 Encounter for fitting and adjustment of urinary device: Secondary | ICD-10-CM | POA: Diagnosis not present

## 2020-10-15 DIAGNOSIS — Z9181 History of falling: Secondary | ICD-10-CM | POA: Diagnosis not present

## 2020-10-15 DIAGNOSIS — Z87891 Personal history of nicotine dependence: Secondary | ICD-10-CM | POA: Diagnosis not present

## 2020-10-15 DIAGNOSIS — Z791 Long term (current) use of non-steroidal anti-inflammatories (NSAID): Secondary | ICD-10-CM | POA: Diagnosis not present

## 2020-10-15 DIAGNOSIS — G834 Cauda equina syndrome: Secondary | ICD-10-CM | POA: Diagnosis not present

## 2020-10-15 DIAGNOSIS — Z792 Long term (current) use of antibiotics: Secondary | ICD-10-CM | POA: Diagnosis not present

## 2020-10-15 DIAGNOSIS — Z87440 Personal history of urinary (tract) infections: Secondary | ICD-10-CM | POA: Diagnosis not present

## 2020-10-15 DIAGNOSIS — G8250 Quadriplegia, unspecified: Secondary | ICD-10-CM | POA: Diagnosis not present

## 2020-10-15 DIAGNOSIS — Z993 Dependence on wheelchair: Secondary | ICD-10-CM | POA: Diagnosis not present

## 2020-10-17 DIAGNOSIS — Z993 Dependence on wheelchair: Secondary | ICD-10-CM | POA: Diagnosis not present

## 2020-10-17 DIAGNOSIS — Z87891 Personal history of nicotine dependence: Secondary | ICD-10-CM | POA: Diagnosis not present

## 2020-10-17 DIAGNOSIS — G834 Cauda equina syndrome: Secondary | ICD-10-CM | POA: Diagnosis not present

## 2020-10-17 DIAGNOSIS — G8250 Quadriplegia, unspecified: Secondary | ICD-10-CM | POA: Diagnosis not present

## 2020-10-17 DIAGNOSIS — Z466 Encounter for fitting and adjustment of urinary device: Secondary | ICD-10-CM | POA: Diagnosis not present

## 2020-10-17 DIAGNOSIS — Z791 Long term (current) use of non-steroidal anti-inflammatories (NSAID): Secondary | ICD-10-CM | POA: Diagnosis not present

## 2020-10-17 DIAGNOSIS — Z87440 Personal history of urinary (tract) infections: Secondary | ICD-10-CM | POA: Diagnosis not present

## 2020-10-17 DIAGNOSIS — Z792 Long term (current) use of antibiotics: Secondary | ICD-10-CM | POA: Diagnosis not present

## 2020-10-17 DIAGNOSIS — Z9181 History of falling: Secondary | ICD-10-CM | POA: Diagnosis not present

## 2020-10-20 DIAGNOSIS — Z87440 Personal history of urinary (tract) infections: Secondary | ICD-10-CM | POA: Diagnosis not present

## 2020-10-20 DIAGNOSIS — G8250 Quadriplegia, unspecified: Secondary | ICD-10-CM | POA: Diagnosis not present

## 2020-10-20 DIAGNOSIS — Z791 Long term (current) use of non-steroidal anti-inflammatories (NSAID): Secondary | ICD-10-CM | POA: Diagnosis not present

## 2020-10-20 DIAGNOSIS — Z9181 History of falling: Secondary | ICD-10-CM | POA: Diagnosis not present

## 2020-10-20 DIAGNOSIS — G834 Cauda equina syndrome: Secondary | ICD-10-CM | POA: Diagnosis not present

## 2020-10-20 DIAGNOSIS — Z466 Encounter for fitting and adjustment of urinary device: Secondary | ICD-10-CM | POA: Diagnosis not present

## 2020-10-20 DIAGNOSIS — Z87891 Personal history of nicotine dependence: Secondary | ICD-10-CM | POA: Diagnosis not present

## 2020-10-20 DIAGNOSIS — Z792 Long term (current) use of antibiotics: Secondary | ICD-10-CM | POA: Diagnosis not present

## 2020-10-20 DIAGNOSIS — Z993 Dependence on wheelchair: Secondary | ICD-10-CM | POA: Diagnosis not present

## 2020-10-23 DIAGNOSIS — G8250 Quadriplegia, unspecified: Secondary | ICD-10-CM | POA: Diagnosis not present

## 2020-10-23 DIAGNOSIS — Z87440 Personal history of urinary (tract) infections: Secondary | ICD-10-CM | POA: Diagnosis not present

## 2020-10-23 DIAGNOSIS — R339 Retention of urine, unspecified: Secondary | ICD-10-CM | POA: Diagnosis not present

## 2020-10-28 DIAGNOSIS — Z466 Encounter for fitting and adjustment of urinary device: Secondary | ICD-10-CM | POA: Diagnosis not present

## 2020-10-28 DIAGNOSIS — Z993 Dependence on wheelchair: Secondary | ICD-10-CM | POA: Diagnosis not present

## 2020-10-28 DIAGNOSIS — Z9181 History of falling: Secondary | ICD-10-CM | POA: Diagnosis not present

## 2020-10-28 DIAGNOSIS — G834 Cauda equina syndrome: Secondary | ICD-10-CM | POA: Diagnosis not present

## 2020-10-28 DIAGNOSIS — G8250 Quadriplegia, unspecified: Secondary | ICD-10-CM | POA: Diagnosis not present

## 2020-10-28 DIAGNOSIS — Z87891 Personal history of nicotine dependence: Secondary | ICD-10-CM | POA: Diagnosis not present

## 2020-10-28 DIAGNOSIS — Z792 Long term (current) use of antibiotics: Secondary | ICD-10-CM | POA: Diagnosis not present

## 2020-10-28 DIAGNOSIS — Z791 Long term (current) use of non-steroidal anti-inflammatories (NSAID): Secondary | ICD-10-CM | POA: Diagnosis not present

## 2020-10-28 DIAGNOSIS — Z87440 Personal history of urinary (tract) infections: Secondary | ICD-10-CM | POA: Diagnosis not present

## 2020-10-29 DIAGNOSIS — Z792 Long term (current) use of antibiotics: Secondary | ICD-10-CM | POA: Diagnosis not present

## 2020-10-29 DIAGNOSIS — G8250 Quadriplegia, unspecified: Secondary | ICD-10-CM | POA: Diagnosis not present

## 2020-10-29 DIAGNOSIS — Z466 Encounter for fitting and adjustment of urinary device: Secondary | ICD-10-CM | POA: Diagnosis not present

## 2020-10-29 DIAGNOSIS — Z791 Long term (current) use of non-steroidal anti-inflammatories (NSAID): Secondary | ICD-10-CM | POA: Diagnosis not present

## 2020-10-29 DIAGNOSIS — Z87891 Personal history of nicotine dependence: Secondary | ICD-10-CM | POA: Diagnosis not present

## 2020-10-29 DIAGNOSIS — Z87440 Personal history of urinary (tract) infections: Secondary | ICD-10-CM | POA: Diagnosis not present

## 2020-10-29 DIAGNOSIS — G834 Cauda equina syndrome: Secondary | ICD-10-CM | POA: Diagnosis not present

## 2020-10-29 DIAGNOSIS — Z9181 History of falling: Secondary | ICD-10-CM | POA: Diagnosis not present

## 2020-10-29 DIAGNOSIS — Z993 Dependence on wheelchair: Secondary | ICD-10-CM | POA: Diagnosis not present

## 2020-10-31 DIAGNOSIS — Z87891 Personal history of nicotine dependence: Secondary | ICD-10-CM | POA: Diagnosis not present

## 2020-10-31 DIAGNOSIS — G834 Cauda equina syndrome: Secondary | ICD-10-CM | POA: Diagnosis not present

## 2020-10-31 DIAGNOSIS — Z993 Dependence on wheelchair: Secondary | ICD-10-CM | POA: Diagnosis not present

## 2020-10-31 DIAGNOSIS — Z9181 History of falling: Secondary | ICD-10-CM | POA: Diagnosis not present

## 2020-10-31 DIAGNOSIS — Z466 Encounter for fitting and adjustment of urinary device: Secondary | ICD-10-CM | POA: Diagnosis not present

## 2020-10-31 DIAGNOSIS — Z87440 Personal history of urinary (tract) infections: Secondary | ICD-10-CM | POA: Diagnosis not present

## 2020-10-31 DIAGNOSIS — Z792 Long term (current) use of antibiotics: Secondary | ICD-10-CM | POA: Diagnosis not present

## 2020-10-31 DIAGNOSIS — Z791 Long term (current) use of non-steroidal anti-inflammatories (NSAID): Secondary | ICD-10-CM | POA: Diagnosis not present

## 2020-10-31 DIAGNOSIS — G8250 Quadriplegia, unspecified: Secondary | ICD-10-CM | POA: Diagnosis not present

## 2020-11-05 DIAGNOSIS — Z792 Long term (current) use of antibiotics: Secondary | ICD-10-CM | POA: Diagnosis not present

## 2020-11-05 DIAGNOSIS — Z87440 Personal history of urinary (tract) infections: Secondary | ICD-10-CM | POA: Diagnosis not present

## 2020-11-05 DIAGNOSIS — Z791 Long term (current) use of non-steroidal anti-inflammatories (NSAID): Secondary | ICD-10-CM | POA: Diagnosis not present

## 2020-11-05 DIAGNOSIS — G8250 Quadriplegia, unspecified: Secondary | ICD-10-CM | POA: Diagnosis not present

## 2020-11-05 DIAGNOSIS — Z87891 Personal history of nicotine dependence: Secondary | ICD-10-CM | POA: Diagnosis not present

## 2020-11-05 DIAGNOSIS — Z466 Encounter for fitting and adjustment of urinary device: Secondary | ICD-10-CM | POA: Diagnosis not present

## 2020-11-05 DIAGNOSIS — G834 Cauda equina syndrome: Secondary | ICD-10-CM | POA: Diagnosis not present

## 2020-11-05 DIAGNOSIS — Z9181 History of falling: Secondary | ICD-10-CM | POA: Diagnosis not present

## 2020-11-05 DIAGNOSIS — Z993 Dependence on wheelchair: Secondary | ICD-10-CM | POA: Diagnosis not present

## 2020-11-06 DIAGNOSIS — Z791 Long term (current) use of non-steroidal anti-inflammatories (NSAID): Secondary | ICD-10-CM | POA: Diagnosis not present

## 2020-11-06 DIAGNOSIS — G8250 Quadriplegia, unspecified: Secondary | ICD-10-CM | POA: Diagnosis not present

## 2020-11-06 DIAGNOSIS — Z792 Long term (current) use of antibiotics: Secondary | ICD-10-CM | POA: Diagnosis not present

## 2020-11-06 DIAGNOSIS — Z993 Dependence on wheelchair: Secondary | ICD-10-CM | POA: Diagnosis not present

## 2020-11-06 DIAGNOSIS — Z87440 Personal history of urinary (tract) infections: Secondary | ICD-10-CM | POA: Diagnosis not present

## 2020-11-06 DIAGNOSIS — Z466 Encounter for fitting and adjustment of urinary device: Secondary | ICD-10-CM | POA: Diagnosis not present

## 2020-11-06 DIAGNOSIS — Z87891 Personal history of nicotine dependence: Secondary | ICD-10-CM | POA: Diagnosis not present

## 2020-11-06 DIAGNOSIS — G834 Cauda equina syndrome: Secondary | ICD-10-CM | POA: Diagnosis not present

## 2020-11-06 DIAGNOSIS — Z9181 History of falling: Secondary | ICD-10-CM | POA: Diagnosis not present

## 2020-11-07 ENCOUNTER — Other Ambulatory Visit: Payer: Self-pay

## 2020-11-11 ENCOUNTER — Other Ambulatory Visit: Payer: Self-pay

## 2020-11-11 DIAGNOSIS — G834 Cauda equina syndrome: Secondary | ICD-10-CM | POA: Diagnosis not present

## 2020-11-11 DIAGNOSIS — R32 Unspecified urinary incontinence: Secondary | ICD-10-CM

## 2020-11-11 DIAGNOSIS — Z791 Long term (current) use of non-steroidal anti-inflammatories (NSAID): Secondary | ICD-10-CM | POA: Diagnosis not present

## 2020-11-11 DIAGNOSIS — Z466 Encounter for fitting and adjustment of urinary device: Secondary | ICD-10-CM | POA: Diagnosis not present

## 2020-11-11 DIAGNOSIS — G8250 Quadriplegia, unspecified: Secondary | ICD-10-CM | POA: Diagnosis not present

## 2020-11-11 DIAGNOSIS — Z993 Dependence on wheelchair: Secondary | ICD-10-CM | POA: Diagnosis not present

## 2020-11-11 DIAGNOSIS — Z9181 History of falling: Secondary | ICD-10-CM | POA: Diagnosis not present

## 2020-11-11 DIAGNOSIS — Z792 Long term (current) use of antibiotics: Secondary | ICD-10-CM | POA: Diagnosis not present

## 2020-11-12 DIAGNOSIS — Z466 Encounter for fitting and adjustment of urinary device: Secondary | ICD-10-CM | POA: Diagnosis not present

## 2020-11-12 DIAGNOSIS — Z792 Long term (current) use of antibiotics: Secondary | ICD-10-CM | POA: Diagnosis not present

## 2020-11-12 DIAGNOSIS — Z993 Dependence on wheelchair: Secondary | ICD-10-CM | POA: Diagnosis not present

## 2020-11-12 DIAGNOSIS — G8250 Quadriplegia, unspecified: Secondary | ICD-10-CM | POA: Diagnosis not present

## 2020-11-12 DIAGNOSIS — G834 Cauda equina syndrome: Secondary | ICD-10-CM | POA: Diagnosis not present

## 2020-11-12 DIAGNOSIS — Z791 Long term (current) use of non-steroidal anti-inflammatories (NSAID): Secondary | ICD-10-CM | POA: Diagnosis not present

## 2020-11-12 DIAGNOSIS — Z9181 History of falling: Secondary | ICD-10-CM | POA: Diagnosis not present

## 2020-11-14 DIAGNOSIS — Z792 Long term (current) use of antibiotics: Secondary | ICD-10-CM | POA: Diagnosis not present

## 2020-11-14 DIAGNOSIS — G8250 Quadriplegia, unspecified: Secondary | ICD-10-CM | POA: Diagnosis not present

## 2020-11-14 DIAGNOSIS — G834 Cauda equina syndrome: Secondary | ICD-10-CM | POA: Diagnosis not present

## 2020-11-14 DIAGNOSIS — Z791 Long term (current) use of non-steroidal anti-inflammatories (NSAID): Secondary | ICD-10-CM | POA: Diagnosis not present

## 2020-11-14 DIAGNOSIS — Z993 Dependence on wheelchair: Secondary | ICD-10-CM | POA: Diagnosis not present

## 2020-11-14 DIAGNOSIS — Z466 Encounter for fitting and adjustment of urinary device: Secondary | ICD-10-CM | POA: Diagnosis not present

## 2020-11-14 DIAGNOSIS — Z9181 History of falling: Secondary | ICD-10-CM | POA: Diagnosis not present

## 2020-11-24 DIAGNOSIS — R339 Retention of urine, unspecified: Secondary | ICD-10-CM | POA: Diagnosis not present

## 2020-11-24 DIAGNOSIS — G8250 Quadriplegia, unspecified: Secondary | ICD-10-CM | POA: Diagnosis not present

## 2020-11-24 DIAGNOSIS — Z87440 Personal history of urinary (tract) infections: Secondary | ICD-10-CM | POA: Diagnosis not present

## 2020-11-25 DIAGNOSIS — Z87440 Personal history of urinary (tract) infections: Secondary | ICD-10-CM | POA: Diagnosis not present

## 2020-11-25 DIAGNOSIS — G8250 Quadriplegia, unspecified: Secondary | ICD-10-CM | POA: Diagnosis not present

## 2020-11-25 DIAGNOSIS — R339 Retention of urine, unspecified: Secondary | ICD-10-CM | POA: Diagnosis not present

## 2020-11-26 DIAGNOSIS — G8250 Quadriplegia, unspecified: Secondary | ICD-10-CM | POA: Diagnosis not present

## 2020-11-26 DIAGNOSIS — Z9181 History of falling: Secondary | ICD-10-CM | POA: Diagnosis not present

## 2020-11-26 DIAGNOSIS — Z466 Encounter for fitting and adjustment of urinary device: Secondary | ICD-10-CM | POA: Diagnosis not present

## 2020-11-26 DIAGNOSIS — Z792 Long term (current) use of antibiotics: Secondary | ICD-10-CM | POA: Diagnosis not present

## 2020-11-26 DIAGNOSIS — Z791 Long term (current) use of non-steroidal anti-inflammatories (NSAID): Secondary | ICD-10-CM | POA: Diagnosis not present

## 2020-11-26 DIAGNOSIS — Z993 Dependence on wheelchair: Secondary | ICD-10-CM | POA: Diagnosis not present

## 2020-11-26 DIAGNOSIS — G834 Cauda equina syndrome: Secondary | ICD-10-CM | POA: Diagnosis not present

## 2020-12-01 DIAGNOSIS — R339 Retention of urine, unspecified: Secondary | ICD-10-CM | POA: Diagnosis not present

## 2020-12-02 ENCOUNTER — Other Ambulatory Visit: Payer: Self-pay | Admitting: *Deleted

## 2020-12-02 ENCOUNTER — Encounter: Payer: Self-pay | Admitting: *Deleted

## 2020-12-02 NOTE — Patient Outreach (Signed)
[facility]) Care Management  [facility] Social Work  12/02/2020  Heidi Hess April 28, 1960 [id number]  Encounter Medications:  Outpatient Encounter Medications as of 12/02/2020  Medication Sig   Acetaminophen 325 MG CAPS    Ascorbic Acid (VITAMIN C) 500 MG CHEW Chew 500 mg by mouth daily.   baclofen (LIORESAL) 10 MG tablet Take 1 tablet (10 mg total) by mouth 3 (three) times daily.   bisacodyl (DULCOLAX) 10 MG suppository Place 10 mg rectally daily.   Calcium Carb-Cholecalciferol (OYSTER SHELL CALCIUM) 500-400 MG-UNIT TABS    Control Gel Formula Dressing (DUODERM CGF EXTRA THIN) MISC Apply 1 each topically once a week.   Docusate Sodium (DSS) 250 MG CAPS    fluconazole (DIFLUCAN) 150 MG tablet Take 1 tablet (150 mg total) by mouth daily.   loratadine (CLARITIN) 10 MG tablet Take 10 mg by mouth daily.   Melatonin 1 MG CAPS Take 1 capsule by mouth at bedtime as needed.   Multiple Vitamins-Minerals (CENTRUM ADULTS) TABS    nitrofurantoin (MACRODANTIN) 50 MG capsule Take 1 capsule (50 mg total) by mouth 2 (two) times daily.   nystatin ointment (MYCOSTATIN) Apply 30 g topically daily.   Olopatadine HCl 0.2 % SOLN    oxybutynin (DITROPAN XL) 15 MG 24 hr tablet Take 1 tablet (15 mg total) by mouth daily.   Simethicone 180 MG CAPS Take 180 mg by mouth daily.   Vitamin D, Cholecalciferol, 25 MCG (1000 UT) TABS Take 5,000 Units by mouth daily at 2 PM.   No facility-administered encounter medications on file as of 12/02/2020.    Functional Status:  In your present state of health, do you have any difficulty performing the following activities: 12/02/2020  Hearing? N  Vision? N  Difficulty concentrating or making decisions? N  Walking or climbing stairs? Y  Comment Acute Complete Quadriplegia due to Spinal Cord Lesion between 5th and 7th Cervial Vertabra.  Dressing or bathing? Y  Comment Acute Complete Quadriplegia due to Spinal Cord Lesion between 5th and 7th Cervial Vertabra.   Doing errands, shopping? Y  Comment Acute Complete Quadriplegia due to Spinal Cord Lesion between 5th and 7th Cervial Vertabra.  Preparing Food and eating ? Y  Comment Acute Complete Quadriplegia due to Spinal Cord Lesion between 5th and 7th Cervial Vertabra.  Using the Toilet? Y  Comment Acute Complete Quadriplegia due to Spinal Cord Lesion between 5th and 7th Cervial Vertabra.  In the past six months, have you accidently leaked urine? Y  Comment Acute Complete Quadriplegia due to Spinal Cord Lesion between 5th and 7th Cervial Vertabra.  Do you have problems with loss of bowel control? N  Managing your Medications? N  Managing your Finances? N  Housekeeping or managing your Housekeeping? Y  Comment Acute Complete Quadriplegia due to Spinal Cord Lesion between 5th and 7th Cervial Vertabra.  Some recent data might be hidden    Fall/Depression Screening:  PHQ 2/9 Scores 12/02/2020 12/19/2019 05/17/2018  PHQ - 2 Score 0 0 0    Assessment:  Care Plan Care Plan : LCSW Plan of Care  Updates made by Francis Gaines, LCSW since 12/02/2020 12:00 AM     Problem: Find Help in My Community.   Priority: High     Goal: Find Help in My Community.   Start Date: 12/02/2020  Expected End Date: 01/30/2021  This Visit's Progress: On track  Priority: High  Note:   Current Barriers:   Patient with Peripheral Vascular Disease, Quadriplegia, Quadriparesis, Cauda  Equina Compression and Non-Ambulatory, needs Support, Education, Referrals, Resources and Care Coordination to resolve unmet personal care needs in the home. Lost home health bath aide through [facility] on 11/14/2020, due to staffing issues. Clinical Goals:   Patient will work with [employer], the [place], and [employer], in an effort to get approved for Adult [employer] and [employer].  Patient will demonstrate improved health management independence as evidenced by having  [place] in place. Clinical Interventions:  Patient will complete Adult Medicaid application and [place] application, emailed on 12/02/2020, notifying LCSW if she needs assistance with application completion and/or submission.   Collaboration with Primary Care Physician, Dr. [name] regarding development and update of comprehensive plan of care as evidenced by provider attestation and co-signature. Inter-disciplinary care team collaboration (see longitudinal plan of care). Problem Solving/Task-Centered Solutions Identified, Caregiver Stress Acknowledged and Consideration of Personal Care Services Encouraged. Discussed plans with patient for ongoing care management follow-up and provided direct contact information for care management team. Collaboration with Primary Care Physician, Dr. [name] regarding need for review and signature of [place] application.     Assisted patient with obtaining information about health plan benefits through [employer]. Assessed needs, level of care concerns, basic eligibility and provided education on [place] application process, and Adult Medicaid application process. Identified resources and durable medical equipment needed in the home to improve safety and promote independence. Patient Goals/Self-Care Activities:  Review and complete Personal Care Services Information, Application and Agency Provider List and 2021 Medicaid Tips and Application, and submit to the [place] (# [phone]) and [employer] [phone]) for processing.   Select 2 to 3 agencies, from the [employer] Provider List, that you would like to use, and keep this list until your assessment is completed by [employer] and you have been approved for services. Work with [employer] on a weekly/bi-weekly basis, until  approved for [employer] and Adult Medicaid, contacting LCSW directly [phone]) if you have questions, need assistance, or if additional social work needs are identified between now and our next scheduled telephone outreach call.   Collaboration with April Crutchfield, representative with [facility] [phone]), to check the status of your application for assistance - currently # 15 on the waiting list. Follow-Up:  12/08/2020 at 11:15am       Goals Addressed               This Visit's Progress     Find Help in My Community. (pt-stated)   On track     Timeframe:  Short-Term Goal Priority:  High Start Date: 12/02/2020                          Expected End Date:  01/30/2021                     Follow-Up Date:  12/08/2020 at 11:15am  Patient Goals/Self-Care Activities:  Review and complete Personal Care Services Information, Application and Agency Provider List and 2021 Medicaid Tips and Application, and submit to the [place] (# [phone]) and [employer] [phone]) for processing.   Select 2 to 3 agencies, from the [employer] Provider List, that you would like to use, and keep this list until your assessment is  completed by [employer] and you have been approved for services. Work with [employer] on a weekly/bi-weekly basis, until approved for [employer] and Adult Medicaid, contacting LCSW directly [phone]) if you have questions, need assistance, or if additional social work needs are identified between now and our next scheduled telephone outreach call.   Collaboration with April Crutchfield, representative with [facility] [phone]), to check the status of your application for assistance - currently # 15 on the waiting list.       Nat Christen, BSW, MSW, [place]  Licensed Clinical Social Worker  [place]  Mailing [place] [address] [place], [place] [address] Physical Address[address], [place], [place] [address] Toll Free Main # [phone] Fax # [phone] Cell # [phone]  [email or web address]

## 2020-12-08 ENCOUNTER — Other Ambulatory Visit: Payer: Self-pay | Admitting: *Deleted

## 2020-12-08 NOTE — Patient Outreach (Signed)
[facility]) Care Management  [facility] Social Work  12/08/2020  Heidi Hess 04/23/1960 [id number]  Encounter Medications:  Outpatient Encounter Medications as of 12/08/2020  Medication Sig   Acetaminophen 325 MG CAPS    Ascorbic Acid (VITAMIN C) 500 MG CHEW Chew 500 mg by mouth daily.   baclofen (LIORESAL) 10 MG tablet Take 1 tablet (10 mg total) by mouth 3 (three) times daily.   bisacodyl (DULCOLAX) 10 MG suppository Place 10 mg rectally daily.   Calcium Carb-Cholecalciferol (OYSTER SHELL CALCIUM) 500-400 MG-UNIT TABS    Control Gel Formula Dressing (DUODERM CGF EXTRA THIN) MISC Apply 1 each topically once a week.   Docusate Sodium (DSS) 250 MG CAPS    fluconazole (DIFLUCAN) 150 MG tablet Take 1 tablet (150 mg total) by mouth daily.   loratadine (CLARITIN) 10 MG tablet Take 10 mg by mouth daily.   Melatonin 1 MG CAPS Take 1 capsule by mouth at bedtime as needed.   Multiple Vitamins-Minerals (CENTRUM ADULTS) TABS    nitrofurantoin (MACRODANTIN) 50 MG capsule Take 1 capsule (50 mg total) by mouth 2 (two) times daily.   nystatin ointment (MYCOSTATIN) Apply 30 g topically daily.   Olopatadine HCl 0.2 % SOLN    oxybutynin (DITROPAN XL) 15 MG 24 hr tablet Take 1 tablet (15 mg total) by mouth daily.   Simethicone 180 MG CAPS Take 180 mg by mouth daily.   Vitamin D, Cholecalciferol, 25 MCG (1000 UT) TABS Take 5,000 Units by mouth daily at 2 PM.   No facility-administered encounter medications on file as of 12/08/2020.    Functional Status:  In your present state of health, do you have any difficulty performing the following activities: 12/02/2020  Hearing? N  Vision? N  Difficulty concentrating or making decisions? N  Walking or climbing stairs? Y  Comment Acute Complete Quadriplegia due to Spinal Cord Lesion between 5th and 7th Cervial Vertabra.  Dressing or bathing? Y  Comment Acute Complete Quadriplegia due to Spinal Cord Lesion between 5th and 7th Cervial Vertabra.   Doing errands, shopping? Y  Comment Acute Complete Quadriplegia due to Spinal Cord Lesion between 5th and 7th Cervial Vertabra.  Preparing Food and eating ? Y  Comment Acute Complete Quadriplegia due to Spinal Cord Lesion between 5th and 7th Cervial Vertabra.  Using the Toilet? Y  Comment Acute Complete Quadriplegia due to Spinal Cord Lesion between 5th and 7th Cervial Vertabra.  In the past six months, have you accidently leaked urine? Y  Comment Acute Complete Quadriplegia due to Spinal Cord Lesion between 5th and 7th Cervial Vertabra.  Do you have problems with loss of bowel control? N  Managing your Medications? N  Managing your Finances? N  Housekeeping or managing your Housekeeping? Y  Comment Acute Complete Quadriplegia due to Spinal Cord Lesion between 5th and 7th Cervial Vertabra.  Some recent data might be hidden    Fall/Depression Screening:  PHQ 2/9 Scores 12/02/2020 12/19/2019 05/17/2018  PHQ - 2 Score 0 0 0    Assessment:  Care Plan Care Plan : LCSW Plan of Care  Updates made by Francis Gaines, LCSW since 12/08/2020 12:00 AM     Problem: Find Help in My Community.   Priority: High     Goal: Find Help in My Community.   Start Date: 12/02/2020  Expected End Date: 01/30/2021  This Visit's Progress: On track  Recent Progress: On track  Priority: High  Note:   Current Barriers:   Patient with Peripheral  Vascular Disease, Quadriplegia, Quadriparesis, Cauda Equina Compression and Non-Ambulatory, needs Support, Education, Referrals, Resources and Care Coordination to resolve unmet personal care needs in the home. Clinical Goals:   Patient will work with [employer], the [place], and [employer], in an effort to get approved for Adult [employer] and [employer].  Clinical Interventions:  Collaboration with Primary Care Physician, Dr. [name] regarding development and update of comprehensive plan of care  as evidenced by provider attestation and co-signature. Collaboration with Primary Care Physician, Dr. [name] regarding need for review and signature of [place] application.     Patient Goals/Self-Care Activities:  Complete Adult Medicaid application and submit to the [place] [phone]) for processing, and [place] application, and submit to [employer] [phone]) for processing.   Select 2 to 3 agencies, from the [employer] Provider List, that you would like to use, and keep this list until your assessment is completed by [employer] and you have been approved for services. Work with [employer] on a weekly/bi-weekly basis, until approved for Adult Medicaid and [place], contacting LCSW directly [phone]) if you have questions, need assistance, or if additional social work needs are identified between now and our next scheduled telephone outreach call.   Continued collaboration with April Crutchfield, representative with [facility] [phone]), to check the status of your application for in-home care services and assistance - currently on the waiting list. Follow-Up:  12/23/2020 at 9:00am       Goals Addressed               This Visit's Progress     Find Help in My Community. (pt-stated)   On track     Timeframe:  Short-Term Goal Priority:  High Start Date: 12/02/2020                          Expected End Date:  01/30/2021                     Follow-Up Date:  12/23/2020 at 9:00am  Patient Goals/Self-Care Activities:  Complete Adult Medicaid application and submit to the [place] (# [phone]) for processing, and [place] application, and submit to [employer] [phone]) for processing.   Select 2 to 3 agencies, from the [employer] Provider List, that you would like to use, and keep this list until your assessment is completed by [employer] and you have been approved for services. Work with [employer] on a weekly/bi-weekly basis, until approved for Adult Medicaid and [place], contacting LCSW directly [phone]) if you have questions, need assistance, or if additional social work needs are identified between now and our next scheduled telephone outreach call.   Continued collaboration with April Crutchfield, representative with [facility] [phone]), to check the status of your application for in-home care services and assistance - currently on the waiting list.      Nat Christen, BSW, MSW, [place]  Licensed Clinical Social Worker  [place]  Mailing [place] [address], [place], [place] [address] Physical Address[address] [place], [place] [address] Toll Free Main # [phone] Fax # [phone] Cell # [phone]  [email or web address]

## 2020-12-16 DIAGNOSIS — Z9181 History of falling: Secondary | ICD-10-CM | POA: Diagnosis not present

## 2020-12-16 DIAGNOSIS — Z993 Dependence on wheelchair: Secondary | ICD-10-CM | POA: Diagnosis not present

## 2020-12-16 DIAGNOSIS — Z791 Long term (current) use of non-steroidal anti-inflammatories (NSAID): Secondary | ICD-10-CM | POA: Diagnosis not present

## 2020-12-16 DIAGNOSIS — Z466 Encounter for fitting and adjustment of urinary device: Secondary | ICD-10-CM | POA: Diagnosis not present

## 2020-12-16 DIAGNOSIS — G8250 Quadriplegia, unspecified: Secondary | ICD-10-CM | POA: Diagnosis not present

## 2020-12-16 DIAGNOSIS — G834 Cauda equina syndrome: Secondary | ICD-10-CM | POA: Diagnosis not present

## 2020-12-16 DIAGNOSIS — Z792 Long term (current) use of antibiotics: Secondary | ICD-10-CM | POA: Diagnosis not present

## 2020-12-18 ENCOUNTER — Other Ambulatory Visit: Payer: Medicare Other | Admitting: *Deleted

## 2020-12-18 NOTE — Patient Outreach (Signed)
[facility]) Care Management  [facility] Social Work  12/18/2020  Heidi Hess 09-29-1960 [id number]  Encounter Medications:  Outpatient Encounter Medications as of 12/18/2020  Medication Sig   Acetaminophen 325 MG CAPS    Ascorbic Acid (VITAMIN C) 500 MG CHEW Chew 500 mg by mouth daily.   baclofen (LIORESAL) 10 MG tablet Take 1 tablet (10 mg total) by mouth 3 (three) times daily.   bisacodyl (DULCOLAX) 10 MG suppository Place 10 mg rectally daily.   Calcium Carb-Cholecalciferol (OYSTER SHELL CALCIUM) 500-400 MG-UNIT TABS    Control Gel Formula Dressing (DUODERM CGF EXTRA THIN) MISC Apply 1 each topically once a week.   Docusate Sodium (DSS) 250 MG CAPS    fluconazole (DIFLUCAN) 150 MG tablet Take 1 tablet (150 mg total) by mouth daily.   loratadine (CLARITIN) 10 MG tablet Take 10 mg by mouth daily.   Melatonin 1 MG CAPS Take 1 capsule by mouth at bedtime as needed.   Multiple Vitamins-Minerals (CENTRUM ADULTS) TABS    nitrofurantoin (MACRODANTIN) 50 MG capsule Take 1 capsule (50 mg total) by mouth 2 (two) times daily.   nystatin ointment (MYCOSTATIN) Apply 30 g topically daily.   Olopatadine HCl 0.2 % SOLN    oxybutynin (DITROPAN XL) 15 MG 24 hr tablet Take 1 tablet (15 mg total) by mouth daily.   Simethicone 180 MG CAPS Take 180 mg by mouth daily.   Vitamin D, Cholecalciferol, 25 MCG (1000 UT) TABS Take 5,000 Units by mouth daily at 2 PM.   No facility-administered encounter medications on file as of 12/18/2020.    Functional Status:  In your present state of health, do you have any difficulty performing the following activities: 12/02/2020  Hearing? N  Vision? N  Difficulty concentrating or making decisions? N  Walking or climbing stairs? Y  Comment Acute Complete Quadriplegia due to Spinal Cord Lesion between 5th and 7th Cervial Vertabra.  Dressing or bathing? Y  Comment Acute Complete Quadriplegia due to Spinal Cord Lesion between 5th and 7th Cervial  Vertabra.  Doing errands, shopping? Y  Comment Acute Complete Quadriplegia due to Spinal Cord Lesion between 5th and 7th Cervial Vertabra.  Preparing Food and eating ? Y  Comment Acute Complete Quadriplegia due to Spinal Cord Lesion between 5th and 7th Cervial Vertabra.  Using the Toilet? Y  Comment Acute Complete Quadriplegia due to Spinal Cord Lesion between 5th and 7th Cervial Vertabra.  In the past six months, have you accidently leaked urine? Y  Comment Acute Complete Quadriplegia due to Spinal Cord Lesion between 5th and 7th Cervial Vertabra.  Do you have problems with loss of bowel control? N  Managing your Medications? N  Managing your Finances? N  Housekeeping or managing your Housekeeping? Y  Comment Acute Complete Quadriplegia due to Spinal Cord Lesion between 5th and 7th Cervial Vertabra.  Some recent data might be hidden    Fall/Depression Screening:  PHQ 2/9 Scores 12/02/2020 12/19/2019 05/17/2018  PHQ - 2 Score 0 0 0    Assessment:  Care Plan Care Plan : LCSW Plan of Care  Updates made by Francis Gaines, LCSW since 12/18/2020 12:00 AM     Problem: Find Help in My Community.   Priority: High     Goal: Find Help in My Community.   Start Date: 12/02/2020  Expected End Date: 01/30/2021  This Visit's Progress: On track  Recent Progress: On track  Priority: High  Note:   Current Barriers:   Patient with Peripheral  Vascular Disease, Quadriplegia, Quadriparesis, Cauda Equina Compression and Non-Ambulatory, needs Support, Education, Referrals, Resources and Care Coordination to resolve unmet personal care needs in the home. Clinical Goals:   Patient will work with [employer], the [place], and [employer], in an effort to get approved for Adult [employer] and [employer].  Clinical Interventions:  Collaboration with Primary Care Physician, Dr. [name] regarding development and update of comprehensive  plan of care as evidenced by provider attestation and co-signature.  Patient Goals/Self-Care Activities:  Collaboration with [name], Medicaid Case Worker at the [place] [phone]), to obtain clarification of what further documentation is required to continue processing your application for Adult Medicaid: Submit all unpaid medical bills/expenses within the last 24 months.  Outstanding medical bills/expenses must meet or exceed the 2022 Income Guidelines to [place] for Adult Medicaid in the [place], in order to obtain full coverage.   Contact [employer] (# [phone]) to obtain legal advice, resources, referrals, advocacy and assistance. Await completion of application for [place], through [employer] [phone]), until you receive your approval/denial letter from the [place] regarding your application for Adult Medicaid.  Work with [employer] on a weekly/bi-weekly basis, until approved for Adult Medicaid and [place], contacting LCSW directly [phone]) if you have questions, need assistance, or if additional social work needs are identified between now and our next scheduled telephone outreach call.   Continued collaboration with April Crutchfield, representative with [facility] [phone]), to check the status of your application for in-home care services and assistance - continue to remain on waiting list.   Follow-Up:  12/23/2020 at 9:00am       Goals Addressed               This Visit's Progress     Find Help in My Community. (pt-stated)   On track     Timeframe:  Short-Term Goal Priority:  High Start Date: 12/02/2020                          Expected End Date:  01/30/2021                     Follow-Up Date:  12/23/2020 at 9:00am  Patient Goals/Self-Care Activities:  Collaboration with [name], Medicaid Case  Worker at the [place] [phone]), to obtain clarification of what further documentation is required to continue processing your application for Adult Medicaid: Submit all unpaid medical bills/expenses within the last 24 months.  Outstanding medical bills/expenses must meet or exceed the 2022 Income Guidelines to [place] for Adult Medicaid in the [place], in order to obtain full coverage.   Contact [employer] (# [phone]) to obtain legal advice, resources, referrals, advocacy and assistance. Await completion of application for [place], through [employer] [phone]), until you receive your approval/denial letter from the [place] regarding your application for Adult Medicaid.  Work with [employer] on a weekly/bi-weekly basis, until approved for Adult Medicaid and [place], contacting LCSW directly [phone]) if you have questions, need assistance, or if additional social work needs are identified between now and our next scheduled telephone outreach call.   Continued collaboration with April Crutchfield, representative with [facility] [phone]), to check the status of  your application for in-home care services and assistance - continue to remain on waiting list.         Nat Christen, BSW, MSW, [place]  Licensed Clinical Social Worker  [place]  Mailing [place] [address] [place], [place] [address] Physical Address[address], [place], [place] [address] Toll Free Main # [phone] Fax # [phone] Cell # [phone]  [email or web address]

## 2020-12-22 ENCOUNTER — Other Ambulatory Visit: Payer: Self-pay

## 2020-12-22 ENCOUNTER — Encounter: Payer: Self-pay | Admitting: Internal Medicine

## 2020-12-22 ENCOUNTER — Ambulatory Visit (INDEPENDENT_AMBULATORY_CARE_PROVIDER_SITE_OTHER): Payer: Medicare Other | Admitting: Internal Medicine

## 2020-12-22 VITALS — BP 122/79 | HR 75 | Ht 66.0 in

## 2020-12-22 DIAGNOSIS — Z23 Encounter for immunization: Secondary | ICD-10-CM | POA: Diagnosis not present

## 2020-12-22 DIAGNOSIS — Z993 Dependence on wheelchair: Secondary | ICD-10-CM | POA: Diagnosis not present

## 2020-12-22 DIAGNOSIS — G8250 Quadriplegia, unspecified: Secondary | ICD-10-CM

## 2020-12-22 DIAGNOSIS — R32 Unspecified urinary incontinence: Secondary | ICD-10-CM

## 2020-12-22 DIAGNOSIS — Z0000 Encounter for general adult medical examination without abnormal findings: Secondary | ICD-10-CM | POA: Insufficient documentation

## 2020-12-22 NOTE — Assessment & Plan Note (Signed)
Patient received a flu shot.

## 2020-12-22 NOTE — Assessment & Plan Note (Signed)
Patient is wheelchair-bound.  She is incontinent of urine.  Denies any chest pain or shortness of breath.  There is no history of fever also skin rash.  She has an indwelling catheter with some sediment on it.  Both hands have been atrophied because of her quadriplegia

## 2020-12-22 NOTE — Assessment & Plan Note (Signed)
General physical examination including breast examination is normal heart is regular chest is clear.  Abdomen is  slightly bloated.

## 2020-12-22 NOTE — Progress Notes (Signed)
Established Patient Office Visit  Subjective:  Patient ID: Heidi Hess, female    DOB: [birth year]  Age: 60 y.o. MRN: [id number]  CC:  Chief Complaint  Patient presents with   Annual Exam    Physical exam    HPI  Heidi Hess presents for physical  History reviewed. No pertinent past medical history.  Past Surgical History:  Procedure Laterality Date   ROBOTIC ASSISTED SUPRACERVICAL HYSTERECTOMY WITH BILATERAL SALPINGO OOPHERECTOMY Bilateral 03/30/2018   Uterus, cervix, bilateral fallopian tubes and ovaries, hysterectomy and bilateral salpingo-oophorectomy    Family History  Problem Relation Age of Onset   Breast cancer Paternal Aunt    Breast cancer Cousin        maternal    Social History   Socioeconomic History   Marital status: Single    Spouse name: Not on file   Number of children: Not on file   Years of education: 12   Highest education level: 12th grade  Occupational History   Not on file  Tobacco Use   Smoking status: Former    Types: Cigarettes    Quit date: 2010    Years since quitting: 12.8    Passive exposure: Past   Smokeless tobacco: Never  Vaping Use   Vaping Use: Never used  Substance and Sexual Activity   Alcohol use: Not Currently   Drug use: Never   Sexual activity: Not Currently  Other Topics Concern   Not on file  Social History Narrative   Not on file   Social Determinants of Health   Financial Resource Strain: Low Risk    Difficulty of Paying Living Expenses: Not hard at all  Food Insecurity: No Food Insecurity   Worried About Charity fundraiser in the Last Year: Never true   Ran Out of Food in the Last Year: Never true  Transportation Needs: No Transportation Needs   Lack of Transportation (Medical): No   Lack of Transportation (Non-Medical): No  Physical Activity: Inactive   Days of Exercise per Week: 0 days   Minutes of Exercise per Session: 0 min  Stress: No Stress Concern Present   Feeling of Stress : Only  a little  Social Connections: Unknown   Frequency of Communication with Friends and Family: More than three times a week   Frequency of Social Gatherings with Friends and Family: More than three times a week   Attends Religious Services: More than 4 times per year   Active Member of [employer] or Organizations: No   Attends Music therapist: Never   Marital Status: Not on file  Intimate Partner Violence: Not At Risk   Fear of Current or Ex-Partner: No   Emotionally Abused: No   Physically Abused: No   Sexually Abused: No     Current Outpatient Medications:    Acetaminophen 325 MG CAPS, , Disp: , Rfl:    Ascorbic Acid (VITAMIN C) 500 MG CHEW, Chew 500 mg by mouth daily., Disp: , Rfl:    baclofen (LIORESAL) 10 MG tablet, Take 1 tablet (10 mg total) by mouth 3 (three) times daily., Disp: 90 each, Rfl: 6   bisacodyl (DULCOLAX) 10 MG suppository, Place 10 mg rectally daily., Disp: , Rfl:    Calcium Carb-Cholecalciferol (OYSTER SHELL CALCIUM) 500-400 MG-UNIT TABS, , Disp: , Rfl:    Control Gel Formula Dressing (DUODERM CGF EXTRA THIN) MISC, Apply 1 each topically once a week., Disp: 4 each, Rfl: 0   Docusate Sodium (DSS) 250  MG CAPS, , Disp: , Rfl:    fluconazole (DIFLUCAN) 150 MG tablet, Take 1 tablet (150 mg total) by mouth daily., Disp: 30 tablet, Rfl: 3   loratadine (CLARITIN) 10 MG tablet, Take 10 mg by mouth daily., Disp: , Rfl:    Melatonin 1 MG CAPS, Take 1 capsule by mouth at bedtime as needed., Disp: , Rfl:    Multiple Vitamins-Minerals (CENTRUM ADULTS) TABS, , Disp: , Rfl:    nitrofurantoin (MACRODANTIN) 50 MG capsule, Take 1 capsule (50 mg total) by mouth 2 (two) times daily., Disp: 180 capsule, Rfl: 3   nystatin ointment (MYCOSTATIN), Apply 30 g topically daily., Disp: , Rfl:    Olopatadine HCl 0.2 % SOLN, , Disp: , Rfl:    oxybutynin (DITROPAN XL) 15 MG 24 hr tablet, Take 1 tablet (15 mg total) by mouth daily., Disp: 90 tablet, Rfl: 6   Simethicone 180 MG CAPS, Take  180 mg by mouth daily., Disp: , Rfl:    Vitamin D, Cholecalciferol, 25 MCG (1000 UT) TABS, Take 5,000 Units by mouth daily at 2 PM., Disp: , Rfl:    Allergies  Allergen Reactions   Latex Rash   Nickel Dermatitis, Itching and Rash   Sulfa Antibiotics Itching, Nausea Only and Rash    ROS Review of Systems  Constitutional:  Positive for fatigue. Negative for chills, fever and unexpected weight change.  HENT: Negative.    Eyes:  Positive for itching.  Respiratory:  Negative for cough, choking, chest tightness, shortness of breath and wheezing.   Cardiovascular:  Positive for leg swelling. Negative for chest pain and palpitations.  Gastrointestinal:  Positive for abdominal distention. Negative for anal bleeding and constipation.  Genitourinary:  Negative for flank pain, hematuria, vaginal bleeding and vaginal discharge.  Musculoskeletal:  Positive for gait problem. Negative for back pain.  Skin:  Negative for rash.  Neurological:  Positive for weakness.  Psychiatric/Behavioral:  Negative for behavioral problems.      Objective:    Physical Exam Vitals reviewed.  Constitutional:      Appearance: Normal appearance.  HENT:     Mouth/Throat:     Mouth: Mucous membranes are moist.  Eyes:     Pupils: Pupils are equal, round, and reactive to light.  Neck:     Vascular: No carotid bruit.  Cardiovascular:     Rate and Rhythm: Normal rate and regular rhythm.     Pulses: Normal pulses.     Heart sounds: Normal heart sounds.  Pulmonary:     Effort: Pulmonary effort is normal.     Breath sounds: Normal breath sounds.  Abdominal:     General: Bowel sounds are normal.     Palpations: Abdomen is soft. There is no hepatomegaly, splenomegaly or mass.     Tenderness: There is no abdominal tenderness.     Hernia: No hernia is present.  Musculoskeletal:        General: No tenderness.     Cervical back: Neck supple.     Right lower leg: No edema.     Left lower leg: No edema.      Comments: Wheel chair bound  Skin:    Findings: No rash.  Neurological:     Mental Status: She is alert and oriented to person, place, and time.     Motor: No weakness.  Psychiatric:        Mood and Affect: Mood and affect normal.        Behavior: Behavior normal.  BP 122/79   Pulse 75   Ht 5\' 6"  (1.676 m)   BMI 27.44 kg/m  Wt Readings from Last 3 Encounters:  06/23/20 170 lb (77.1 kg)  12/19/19 170 lb (77.1 kg)     Health Maintenance Due  Topic Date Due   HIV Screening  Never done   Hepatitis C Screening  Never done   Zoster Vaccines- Shingrix (1 of 2) Never done   COVID-19 Vaccine (4 - Booster for Pfizer series) 07/07/2020    There are no preventive care reminders to display for this patient.  Lab Results  Component Value Date   TSH 1.64 12/19/2019   Lab Results  Component Value Date   WBC 5.5 12/19/2019   HGB 13.8 12/19/2019   HCT 41.9 12/19/2019   MCV 89.9 12/19/2019   PLT 203 12/19/2019   Lab Results  Component Value Date   NA 137 12/19/2019   K 3.8 12/19/2019   CO2 25 12/19/2019   GLUCOSE 84 12/19/2019   BUN 16 12/19/2019   CREATININE 0.47 (L) 12/19/2019   BILITOT 0.5 12/19/2019   AST 16 12/19/2019   ALT 11 12/19/2019   PROT 7.1 12/19/2019   CALCIUM 9.0 12/19/2019   Lab Results  Component Value Date   CHOL 189 12/19/2019   Lab Results  Component Value Date   HDL 42 (L) 12/19/2019   Lab Results  Component Value Date   LDLCALC 126 (H) 12/19/2019   Lab Results  Component Value Date   TRIG 104 12/19/2019   Lab Results  Component Value Date   CHOLHDL 4.5 12/19/2019   No results found for: HGBA1C    Assessment & Plan:   Problem List Items Addressed This Visit       Nervous and Auditory   Quadriplegia and quadriparesis ([place])    Patient is wheelchair-bound.  She is incontinent of urine.  Denies any chest pain or shortness of breath.  There is no history of fever also skin rash.  She has an indwelling catheter with some sediment  on it.  Both hands have been atrophied because of her quadriplegia        Other   Incontinence in female    She has an indwelling Foley catheter      Wheelchair dependence    Patient is wheelchair-bound and is able to take care of herself      Need for influenza vaccination - Primary    Patient received a flu shot.      Relevant Orders   Flu Vaccine QUAD 6+ mos PF IM (Fluarix Quad PF) (Completed)   Annual physical exam    General physical examination including breast examination is normal heart is regular chest is clear.  Abdomen is  slightly bloated.      Other Visit Diagnoses     Need for pneumococcal vaccination       Relevant Orders   Pneumococcal conjugate vaccine 20-valent (Prevnar 20) (Completed)       No orders of the defined types were placed in this encounter.   Follow-up: No follow-ups on file.    [name], MD

## 2020-12-22 NOTE — Assessment & Plan Note (Signed)
Patient is wheelchair-bound and is able to take care of herself

## 2020-12-22 NOTE — Assessment & Plan Note (Signed)
She has an indwelling Foley catheter

## 2020-12-23 ENCOUNTER — Other Ambulatory Visit: Payer: Self-pay | Admitting: *Deleted

## 2020-12-23 NOTE — Patient Outreach (Signed)
[facility]) Care Management  [facility] Social Work  12/23/2020  Heidi Hess 1960-03-29 [id number]  Encounter Medications:  Outpatient Encounter Medications as of 12/23/2020  Medication Sig   Acetaminophen 325 MG CAPS    Ascorbic Acid (VITAMIN C) 500 MG CHEW Chew 500 mg by mouth daily.   baclofen (LIORESAL) 10 MG tablet Take 1 tablet (10 mg total) by mouth 3 (three) times daily.   bisacodyl (DULCOLAX) 10 MG suppository Place 10 mg rectally daily.   Calcium Carb-Cholecalciferol (OYSTER SHELL CALCIUM) 500-400 MG-UNIT TABS    Control Gel Formula Dressing (DUODERM CGF EXTRA THIN) MISC Apply 1 each topically once a week.   Docusate Sodium (DSS) 250 MG CAPS    fluconazole (DIFLUCAN) 150 MG tablet Take 1 tablet (150 mg total) by mouth daily.   loratadine (CLARITIN) 10 MG tablet Take 10 mg by mouth daily.   Melatonin 1 MG CAPS Take 1 capsule by mouth at bedtime as needed.   Multiple Vitamins-Minerals (CENTRUM ADULTS) TABS    nitrofurantoin (MACRODANTIN) 50 MG capsule Take 1 capsule (50 mg total) by mouth 2 (two) times daily.   nystatin ointment (MYCOSTATIN) Apply 30 g topically daily.   Olopatadine HCl 0.2 % SOLN    oxybutynin (DITROPAN XL) 15 MG 24 hr tablet Take 1 tablet (15 mg total) by mouth daily.   Simethicone 180 MG CAPS Take 180 mg by mouth daily.   Vitamin D, Cholecalciferol, 25 MCG (1000 UT) TABS Take 5,000 Units by mouth daily at 2 PM.   No facility-administered encounter medications on file as of 12/23/2020.    Functional Status:  In your present state of health, do you have any difficulty performing the following activities: 12/02/2020  Hearing? N  Vision? N  Difficulty concentrating or making decisions? N  Walking or climbing stairs? Y  Comment Acute Complete Quadriplegia due to Spinal Cord Lesion between 5th and 7th Cervial Vertabra.  Dressing or bathing? Y  Comment Acute Complete Quadriplegia due to Spinal Cord Lesion between 5th and 7th Cervial  Vertabra.  Doing errands, shopping? Y  Comment Acute Complete Quadriplegia due to Spinal Cord Lesion between 5th and 7th Cervial Vertabra.  Preparing Food and eating ? Y  Comment Acute Complete Quadriplegia due to Spinal Cord Lesion between 5th and 7th Cervial Vertabra.  Using the Toilet? Y  Comment Acute Complete Quadriplegia due to Spinal Cord Lesion between 5th and 7th Cervial Vertabra.  In the past six months, have you accidently leaked urine? Y  Comment Acute Complete Quadriplegia due to Spinal Cord Lesion between 5th and 7th Cervial Vertabra.  Do you have problems with loss of bowel control? N  Managing your Medications? N  Managing your Finances? N  Housekeeping or managing your Housekeeping? Y  Comment Acute Complete Quadriplegia due to Spinal Cord Lesion between 5th and 7th Cervial Vertabra.  Some recent data might be hidden    Fall/Depression Screening:  PHQ 2/9 Scores 12/22/2020 12/02/2020 12/19/2019 05/17/2018  PHQ - 2 Score 0 0 0 0    Assessment:  Care Plan Care Plan : LCSW Plan of Care  Updates made by Francis Gaines, LCSW since 12/23/2020 12:00 AM     Problem: Find Help in My Community.   Priority: High     Goal: Find Help in My Community.   Start Date: 12/02/2020  Expected End Date: 01/30/2021  This Visit's Progress: On track  Recent Progress: On track  Priority: High  Note:   Current Barriers:   Patient  with Peripheral Vascular Disease, Quadriplegia, Quadriparesis, Cauda Equina Compression and Non-Ambulatory, needs Support, Education, Referrals, Resources and Care Coordination to resolve unmet personal care needs in the home. Clinical Goals:   Patient will work with [employer], the [place], and [employer], in an effort to get approved for Adult [employer] and [employer].  Clinical Interventions:  Collaboration with Primary Care Physician, Dr. [name] regarding development and update of  comprehensive plan of care as evidenced by provider attestation and co-signature.  Patient Goals/Self-Care Activities:  Collaboration with [name], Medicaid Case Worker at the [place] [phone]), to periodically check the status of your Adult Medicaid application, now that all required documentation has been submitted for processing.   Emphasized importance of contacting representative with [employer] (# [phone]), to obtain legal advice, resources, referrals, advocacy and assistance, in the event that you are denied for Adult Medicaid. Work with [employer] on a weekly/bi-weekly basis, until approved for Adult Medicaid and [place], contacting LCSW directly [phone]) if you have questions, need assistance, or if additional social work needs are identified between now and our next scheduled telephone outreach call.   Follow-Up:  01/20/2021 at 9:00am       Goals Addressed               This Visit's Progress     Find Help in My Community. (pt-stated)   On track     Timeframe:  Short-Term Goal Priority:  High Start Date: 12/02/2020                          Expected End Date:  01/30/2021                     Follow-Up Date:  01/20/2021 at 9:00am  Patient Goals/Self-Care Activities:  Collaboration with [name], Medicaid Case Worker at the [place] [phone]), to periodically check the status of your Adult Medicaid application, now that all required documentation has been submitted for processing.   Emphasized importance of contacting representative with [employer] (# [phone]), to obtain legal advice, resources, referrals, advocacy and assistance, in the event that you are denied for Adult Medicaid. Work with [employer] on a weekly/bi-weekly basis, until approved for Adult Medicaid and [place], contacting LCSW directly [phone]) if you have questions, need  assistance, or if additional social work needs are identified between now and our next scheduled telephone outreach call.        Nat Christen, BSW, MSW, LCSW  Licensed Education officer, environmental Health System  Mailing [place] [address] [place], [place] [address] Physical Address-[address] [place], [place] [address] Toll Free Main # [phone] Fax # [phone] Cell # [phone]  [email or web address]

## 2020-12-31 DIAGNOSIS — G8250 Quadriplegia, unspecified: Secondary | ICD-10-CM | POA: Diagnosis not present

## 2020-12-31 DIAGNOSIS — R339 Retention of urine, unspecified: Secondary | ICD-10-CM | POA: Diagnosis not present

## 2020-12-31 DIAGNOSIS — Z87440 Personal history of urinary (tract) infections: Secondary | ICD-10-CM | POA: Diagnosis not present

## 2021-01-01 DIAGNOSIS — G8250 Quadriplegia, unspecified: Secondary | ICD-10-CM | POA: Diagnosis not present

## 2021-01-01 DIAGNOSIS — R339 Retention of urine, unspecified: Secondary | ICD-10-CM | POA: Diagnosis not present

## 2021-01-01 DIAGNOSIS — Z87440 Personal history of urinary (tract) infections: Secondary | ICD-10-CM | POA: Diagnosis not present

## 2021-01-06 DIAGNOSIS — G8250 Quadriplegia, unspecified: Secondary | ICD-10-CM | POA: Diagnosis not present

## 2021-01-06 DIAGNOSIS — G834 Cauda equina syndrome: Secondary | ICD-10-CM | POA: Diagnosis not present

## 2021-01-06 DIAGNOSIS — Z466 Encounter for fitting and adjustment of urinary device: Secondary | ICD-10-CM | POA: Diagnosis not present

## 2021-01-06 DIAGNOSIS — Z993 Dependence on wheelchair: Secondary | ICD-10-CM | POA: Diagnosis not present

## 2021-01-06 DIAGNOSIS — Z791 Long term (current) use of non-steroidal anti-inflammatories (NSAID): Secondary | ICD-10-CM | POA: Diagnosis not present

## 2021-01-06 DIAGNOSIS — Z792 Long term (current) use of antibiotics: Secondary | ICD-10-CM | POA: Diagnosis not present

## 2021-01-06 DIAGNOSIS — Z9181 History of falling: Secondary | ICD-10-CM | POA: Diagnosis not present

## 2021-01-19 ENCOUNTER — Other Ambulatory Visit: Payer: Self-pay | Admitting: *Deleted

## 2021-01-20 ENCOUNTER — Other Ambulatory Visit: Payer: Self-pay | Admitting: *Deleted

## 2021-01-20 ENCOUNTER — Encounter: Payer: Self-pay | Admitting: *Deleted

## 2021-01-20 NOTE — Patient Outreach (Signed)
Care Management Clinical Social Work Note  01/20/2021 Name: Heidi Hess MRN: [id number] DOB: 1960/06/25  Heidi Hess is a 60 y.o. year old female who is a primary care patient of Heidi Athens, MD.  The Care Management team was consulted for assistance with chronic disease management and coordination needs.  Engaged with patient by telephone for follow up visit in response to provider referral for social work chronic care management and care coordination services  Consent to Services:  [name] was given information about Care Management services today including:  Care Management services includes personalized support from designated clinical staff supervised by her physician, including individualized plan of care and coordination with other care providers 24/7 contact phone numbers for assistance for urgent and routine care needs. The patient may stop case management services at any time by phone call to the office staff.  Patient agreed to services and consent obtained.   Assessment: Review of patient past medical history, allergies, medications, and health status, including review of relevant consultants reports was performed today as part of a comprehensive evaluation and provision of chronic care management and care coordination services.  SDOH (Social Determinants of Health) assessments and interventions performed:    Advanced Directives Status: Not addressed in this encounter.  Care Plan  Allergies  Allergen Reactions   Latex Rash   Nickel Dermatitis, Itching and Rash   Sulfa Antibiotics Itching, Nausea Only and Rash    Outpatient Encounter Medications as of 01/20/2021  Medication Sig   Acetaminophen 325 MG CAPS    Ascorbic Acid (VITAMIN C) 500 MG CHEW Chew 500 mg by mouth daily.   baclofen (LIORESAL) 10 MG tablet Take 1 tablet (10 mg total) by mouth 3 (three) times daily.   bisacodyl (DULCOLAX) 10 MG suppository Place 10 mg rectally daily.   Calcium  Carb-Cholecalciferol (OYSTER SHELL CALCIUM) 500-400 MG-UNIT TABS    Control Gel Formula Dressing (DUODERM CGF EXTRA THIN) MISC Apply 1 each topically once a week.   Docusate Sodium (DSS) 250 MG CAPS    fluconazole (DIFLUCAN) 150 MG tablet Take 1 tablet (150 mg total) by mouth daily.   loratadine (CLARITIN) 10 MG tablet Take 10 mg by mouth daily.   Melatonin 1 MG CAPS Take 1 capsule by mouth at bedtime as needed.   Multiple Vitamins-Minerals (CENTRUM ADULTS) TABS    nitrofurantoin (MACRODANTIN) 50 MG capsule Take 1 capsule (50 mg total) by mouth 2 (two) times daily.   nystatin ointment (MYCOSTATIN) Apply 30 g topically daily.   Olopatadine HCl 0.2 % SOLN    oxybutynin (DITROPAN XL) 15 MG 24 hr tablet Take 1 tablet (15 mg total) by mouth daily.   Simethicone 180 MG CAPS Take 180 mg by mouth daily.   Vitamin D, Cholecalciferol, 25 MCG (1000 UT) TABS Take 5,000 Units by mouth daily at 2 PM.   No facility-administered encounter medications on file as of 01/20/2021.    Patient Active Problem List   Diagnosis Date Noted   Annual physical exam 12/22/2020   PVD (peripheral vascular disease) ([place]) 06/25/2020   Need for tetanus booster 06/04/2020   Pressure injury of skin 06/04/2020   Need for COVID-19 vaccine 05/12/2020   Screening for colon cancer 12/19/2019   Need for influenza vaccination 12/19/2019   Incontinence in female 08/24/2019   Quadriplegia and quadriparesis ([place]) 08/24/2019   Wheelchair dependence 08/24/2019   Cauda equina compression ([place]) 08/24/2019   Right ovarian cyst 02/04/2018   Endometrial cancer ([place]) 01/25/2018   Acute  complete quadriplegia due to spinal cord lesion between fifth and seventh cervical vertebra ([place]) 09/11/1977    Conditions to be addressed/monitored:  Peripheral Vascular Disease and Quadriplegia.  Social Isolation, Lack of Multimedia programmer, Engineer, materials Concerns and Lacks Knowledge of [employer].  Care Plan : LCSW Plan of Care  Updates made by  Francis Gaines, LCSW since 01/20/2021 12:00 AM     Problem: Find Help in My Community. Resolved 01/20/2021  Priority: High     Goal: Find Help in My Community. Completed 01/20/2021  Start Date: 12/02/2020  Expected End Date: 01/20/2021  This Visit's Progress: On track  Recent Progress: On track  Priority: High  Note:   Current Barriers:   Patient with Peripheral Vascular Disease, Quadriplegia, Quadriparesis, Cauda Equina Compression and Non-Ambulatory, needs Support, Education, Referrals, Resources and Care Coordination to resolve unmet personal care needs in the home. Clinical Goals:   Patient will work with [employer], the [facility], and [employer], in an effort to get approved for Adult [employer].  Clinical Interventions:  Collaboration with Primary Care Physician, Dr. Cletis Hess regarding development and update of comprehensive plan of care as evidenced by provider attestation and co-signature.  Patient Goals/Self-Care Activities:  LCSW collaboration with Heidi Hess, Medicaid Case Worker at the [place] [phone]), to check the status of you pending Adult Medicaid application. Confirmation received from Heidi Hess that you have been denied for Adult Medicaid coverage, due to [place] exceeding poverty guidelines for the [place] of [place] in fiscal year 2022.   Re-emphasized importance of contacting representative with [employer] (# [phone]), to obtain legal advice, resources, referrals, advocacy and assistance, now that you have been denied for Adult Medicaid coverage. Continue to receive home health nursing services, every three weeks, for catheter changes, vitals check, medication administration monitoring, etc, through [facility] (# [phone]). Remain on waiting list to receive home health bath aide, ordered for 3 times per  week, through April Hess with [facility]. LCSW collaboration with Heidi Hess to learn that you are now # 13 on the waiting list to receive a home health bath aide. Begin receiving reduced cost catheter supplies through [employer] (# [phone]).  Supplies to be delivered directly to your home every three month. Begin utilizing free transportation benefit through [employer] [phone]), to get to and from physician appointments. Contact LCSW directly if you have questions, need assistance, or if additional social work needs are identified in the near future.   Follow-Up:  No follow-up required, per patient.    Nat Christen, BSW, MSW, LCSW  Licensed Education officer, environmental Health System  Mailing [place] [address], [place], [place] [address] Physical Address-[address] [place], [place] [address] Toll Free Main # [phone] Fax # [phone] Cell # [phone]  [email or web address]

## 2021-01-26 DIAGNOSIS — Z7401 Bed confinement status: Secondary | ICD-10-CM | POA: Diagnosis not present

## 2021-01-26 DIAGNOSIS — Z791 Long term (current) use of non-steroidal anti-inflammatories (NSAID): Secondary | ICD-10-CM | POA: Diagnosis not present

## 2021-01-26 DIAGNOSIS — G834 Cauda equina syndrome: Secondary | ICD-10-CM | POA: Diagnosis not present

## 2021-01-26 DIAGNOSIS — Z792 Long term (current) use of antibiotics: Secondary | ICD-10-CM | POA: Diagnosis not present

## 2021-01-26 DIAGNOSIS — G8250 Quadriplegia, unspecified: Secondary | ICD-10-CM | POA: Diagnosis not present

## 2021-01-26 DIAGNOSIS — Z466 Encounter for fitting and adjustment of urinary device: Secondary | ICD-10-CM | POA: Diagnosis not present

## 2021-01-26 DIAGNOSIS — Z9181 History of falling: Secondary | ICD-10-CM | POA: Diagnosis not present

## 2021-02-02 DIAGNOSIS — R339 Retention of urine, unspecified: Secondary | ICD-10-CM | POA: Diagnosis not present

## 2021-02-05 DIAGNOSIS — R339 Retention of urine, unspecified: Secondary | ICD-10-CM | POA: Diagnosis not present

## 2021-02-09 ENCOUNTER — Other Ambulatory Visit: Payer: Self-pay | Admitting: Internal Medicine

## 2021-02-17 DIAGNOSIS — G834 Cauda equina syndrome: Secondary | ICD-10-CM | POA: Diagnosis not present

## 2021-02-17 DIAGNOSIS — Z466 Encounter for fitting and adjustment of urinary device: Secondary | ICD-10-CM | POA: Diagnosis not present

## 2021-02-17 DIAGNOSIS — G8250 Quadriplegia, unspecified: Secondary | ICD-10-CM | POA: Diagnosis not present

## 2021-02-17 DIAGNOSIS — Z9181 History of falling: Secondary | ICD-10-CM | POA: Diagnosis not present

## 2021-02-17 DIAGNOSIS — Z7401 Bed confinement status: Secondary | ICD-10-CM | POA: Diagnosis not present

## 2021-02-17 DIAGNOSIS — Z792 Long term (current) use of antibiotics: Secondary | ICD-10-CM | POA: Diagnosis not present

## 2021-02-17 DIAGNOSIS — Z791 Long term (current) use of non-steroidal anti-inflammatories (NSAID): Secondary | ICD-10-CM | POA: Diagnosis not present

## 2021-03-04 DIAGNOSIS — G8250 Quadriplegia, unspecified: Secondary | ICD-10-CM | POA: Diagnosis not present

## 2021-03-04 DIAGNOSIS — R339 Retention of urine, unspecified: Secondary | ICD-10-CM | POA: Diagnosis not present

## 2021-03-04 DIAGNOSIS — Z87440 Personal history of urinary (tract) infections: Secondary | ICD-10-CM | POA: Diagnosis not present

## 2021-03-05 DIAGNOSIS — Z791 Long term (current) use of non-steroidal anti-inflammatories (NSAID): Secondary | ICD-10-CM | POA: Diagnosis not present

## 2021-03-05 DIAGNOSIS — Z9181 History of falling: Secondary | ICD-10-CM | POA: Diagnosis not present

## 2021-03-05 DIAGNOSIS — Z792 Long term (current) use of antibiotics: Secondary | ICD-10-CM | POA: Diagnosis not present

## 2021-03-05 DIAGNOSIS — G8250 Quadriplegia, unspecified: Secondary | ICD-10-CM | POA: Diagnosis not present

## 2021-03-05 DIAGNOSIS — Z7401 Bed confinement status: Secondary | ICD-10-CM | POA: Diagnosis not present

## 2021-03-05 DIAGNOSIS — G834 Cauda equina syndrome: Secondary | ICD-10-CM | POA: Diagnosis not present

## 2021-03-05 DIAGNOSIS — Z466 Encounter for fitting and adjustment of urinary device: Secondary | ICD-10-CM | POA: Diagnosis not present

## 2021-03-12 DIAGNOSIS — Z466 Encounter for fitting and adjustment of urinary device: Secondary | ICD-10-CM | POA: Diagnosis not present

## 2021-03-12 DIAGNOSIS — Z9181 History of falling: Secondary | ICD-10-CM | POA: Diagnosis not present

## 2021-03-12 DIAGNOSIS — G834 Cauda equina syndrome: Secondary | ICD-10-CM | POA: Diagnosis not present

## 2021-03-12 DIAGNOSIS — G8250 Quadriplegia, unspecified: Secondary | ICD-10-CM | POA: Diagnosis not present

## 2021-03-12 DIAGNOSIS — Z7401 Bed confinement status: Secondary | ICD-10-CM | POA: Diagnosis not present

## 2021-03-30 ENCOUNTER — Other Ambulatory Visit: Payer: Self-pay

## 2021-03-31 DIAGNOSIS — Z7401 Bed confinement status: Secondary | ICD-10-CM | POA: Diagnosis not present

## 2021-03-31 DIAGNOSIS — Z466 Encounter for fitting and adjustment of urinary device: Secondary | ICD-10-CM | POA: Diagnosis not present

## 2021-03-31 DIAGNOSIS — G8250 Quadriplegia, unspecified: Secondary | ICD-10-CM | POA: Diagnosis not present

## 2021-03-31 DIAGNOSIS — G834 Cauda equina syndrome: Secondary | ICD-10-CM | POA: Diagnosis not present

## 2021-03-31 DIAGNOSIS — Z9181 History of falling: Secondary | ICD-10-CM | POA: Diagnosis not present

## 2021-04-06 DIAGNOSIS — Z87440 Personal history of urinary (tract) infections: Secondary | ICD-10-CM | POA: Diagnosis not present

## 2021-04-06 DIAGNOSIS — R339 Retention of urine, unspecified: Secondary | ICD-10-CM | POA: Diagnosis not present

## 2021-04-06 DIAGNOSIS — G8250 Quadriplegia, unspecified: Secondary | ICD-10-CM | POA: Diagnosis not present

## 2021-04-14 ENCOUNTER — Other Ambulatory Visit: Payer: Self-pay

## 2021-04-14 MED ORDER — NITROFURANTOIN MACROCRYSTAL 50 MG PO CAPS: 50.0000 mg | ORAL_CAPSULE | Freq: Two times a day (BID) | ORAL | 3 refills | Status: DC

## 2021-04-14 MED ORDER — OXYBUTYNIN CHLORIDE ER 15 MG PO TB24: 15.0000 mg | ORAL_TABLET | Freq: Every day | ORAL | 2 refills | Status: DC

## 2021-04-20 ENCOUNTER — Ambulatory Visit: Payer: Medicare Other | Admitting: Internal Medicine

## 2021-04-21 ENCOUNTER — Encounter: Payer: Self-pay | Admitting: Internal Medicine

## 2021-04-21 ENCOUNTER — Ambulatory Visit (INDEPENDENT_AMBULATORY_CARE_PROVIDER_SITE_OTHER): Payer: Medicare Other | Admitting: Internal Medicine

## 2021-04-21 ENCOUNTER — Other Ambulatory Visit: Payer: Self-pay

## 2021-04-21 VITALS — BP 110/70 | HR 68 | Ht 66.0 in

## 2021-04-21 DIAGNOSIS — G8250 Quadriplegia, unspecified: Secondary | ICD-10-CM | POA: Diagnosis not present

## 2021-04-21 DIAGNOSIS — Z1211 Encounter for screening for malignant neoplasm of colon: Secondary | ICD-10-CM

## 2021-04-21 DIAGNOSIS — C541 Malignant neoplasm of endometrium: Secondary | ICD-10-CM

## 2021-04-21 DIAGNOSIS — Z993 Dependence on wheelchair: Secondary | ICD-10-CM

## 2021-04-21 NOTE — Assessment & Plan Note (Signed)
Stable

## 2021-04-21 NOTE — Assessment & Plan Note (Signed)
Patient will have Cologuard screening test

## 2021-04-21 NOTE — Assessment & Plan Note (Signed)
Patient is comfortable heart is regular chest is clear

## 2021-04-21 NOTE — Progress Notes (Signed)
Established Patient Office Visit  Subjective:  Patient ID: Heidi Hess, female    DOB: [birth year]  Age: 61 y.o. MRN: [id number]  CC:  Chief Complaint  Patient presents with   Follow-up    HPI  Heidi Hess presents for general checkup  History reviewed. No pertinent past medical history.  Past Surgical History:  Procedure Laterality Date   ROBOTIC ASSISTED SUPRACERVICAL HYSTERECTOMY WITH BILATERAL SALPINGO OOPHERECTOMY Bilateral 03/30/2018   Uterus, cervix, bilateral fallopian tubes and ovaries, hysterectomy and bilateral salpingo-oophorectomy    Family History  Problem Relation Age of Onset   Breast cancer Paternal Aunt    Breast cancer Cousin        maternal    Social History   Socioeconomic History   Marital status: Single    Spouse name: Not on file   Number of children: Not on file   Years of education: 12   Highest education level: 12th grade  Occupational History   Not on file  Tobacco Use   Smoking status: Former    Types: Cigarettes    Quit date: 2010    Years since quitting: 13.1    Passive exposure: Past   Smokeless tobacco: Never  Vaping Use   Vaping Use: Never used  Substance and Sexual Activity   Alcohol use: Not Currently   Drug use: Never   Sexual activity: Not Currently  Other Topics Concern   Not on file  Social History Narrative   Not on file   Social Determinants of Health   Financial Resource Strain: Low Risk    Difficulty of Paying Living Expenses: Not hard at all  Food Insecurity: No Food Insecurity   Worried About Charity fundraiser in the Last Year: Never true   Ran Out of Food in the Last Year: Never true  Transportation Needs: No Transportation Needs   Lack of Transportation (Medical): No   Lack of Transportation (Non-Medical): No  Physical Activity: Inactive   Days of Exercise per Week: 0 days   Minutes of Exercise per Session: 0 min  Stress: No Stress Concern Present   Feeling of Stress : Only a little   Social Connections: Unknown   Frequency of Communication with Friends and Family: More than three times a week   Frequency of Social Gatherings with Friends and Family: More than three times a week   Attends Religious Services: More than 4 times per year   Active Member of [employer] or Organizations: No   Attends Music therapist: Never   Marital Status: Not on file  Intimate Partner Violence: Not At Risk   Fear of Current or Ex-Partner: No   Emotionally Abused: No   Physically Abused: No   Sexually Abused: No     Current Outpatient Medications:    Acetaminophen 325 MG CAPS, , Disp: , Rfl:    Ascorbic Acid (VITAMIN C) 500 MG CHEW, Chew 500 mg by mouth daily., Disp: , Rfl:    baclofen (LIORESAL) 10 MG tablet, Take 1 tablet (10 mg total) by mouth 3 (three) times daily., Disp: 90 each, Rfl: 6   bisacodyl (DULCOLAX) 10 MG suppository, Place 10 mg rectally daily., Disp: , Rfl:    Calcium Carb-Cholecalciferol (OYSTER SHELL CALCIUM) 500-400 MG-UNIT TABS, , Disp: , Rfl:    Control Gel Formula Dressing (DUODERM CGF EXTRA THIN) MISC, Apply 1 each topically once a week., Disp: 4 each, Rfl: 0   Docusate Sodium (DSS) 250 MG CAPS, , Disp: ,  Rfl:    fluconazole (DIFLUCAN) 150 MG tablet, Take 1 tablet (150 mg total) by mouth daily., Disp: 30 tablet, Rfl: 3   loratadine (CLARITIN) 10 MG tablet, Take 10 mg by mouth daily., Disp: , Rfl:    Melatonin 1 MG CAPS, Take 1 capsule by mouth at bedtime as needed., Disp: , Rfl:    Multiple Vitamins-Minerals (CENTRUM ADULTS) TABS, , Disp: , Rfl:    nitrofurantoin (MACRODANTIN) 50 MG capsule, Take 1 capsule (50 mg total) by mouth 2 (two) times daily., Disp: 180 capsule, Rfl: 3   nystatin ointment (MYCOSTATIN), Apply 30 g topically daily., Disp: , Rfl:    Olopatadine HCl 0.2 % SOLN, , Disp: , Rfl:    oxybutynin (DITROPAN XL) 15 MG 24 hr tablet, Take 1 tablet (15 mg total) by mouth daily., Disp: 90 tablet, Rfl: 2   Simethicone 180 MG CAPS, Take 180 mg by  mouth daily., Disp: , Rfl:    Vitamin D, Cholecalciferol, 25 MCG (1000 UT) TABS, Take 5,000 Units by mouth daily at 2 PM., Disp: , Rfl:    Allergies  Allergen Reactions   Latex Rash   Nickel Dermatitis, Itching and Rash   Sulfa Antibiotics Itching, Nausea Only and Rash    ROS Review of Systems  Eyes: Negative.   Respiratory: Negative.    Cardiovascular: Negative.   Gastrointestinal: Negative.      Objective:    Physical Exam Vitals reviewed.  Constitutional:      Appearance: Normal appearance.  Cardiovascular:     Rate and Rhythm: Normal rate.  Pulmonary:     Effort: Pulmonary effort is normal.     Breath sounds: No wheezing or rales.  Abdominal:     General: Bowel sounds are normal. There is no distension.  Neurological:     Mental Status: She is alert.     Comments: Patient is wheelchair-bound she  isquadriplegic  Psychiatric:        Mood and Affect: Mood normal.        Behavior: Behavior normal.        Thought Content: Thought content normal.    BP 110/70    Pulse 68    Ht 5\' 6"  (1.676 m)    BMI 27.44 kg/m  Wt Readings from Last 3 Encounters:  06/23/20 170 lb (77.1 kg)  12/19/19 170 lb (77.1 kg)     Health Maintenance Due  Topic Date Due   HIV Screening  Never done   Hepatitis C Screening  Never done   COVID-19 Vaccine (4 - Booster for Pfizer series) 07/07/2020    There are no preventive care reminders to display for this patient.  Lab Results  Component Value Date   TSH 1.64 12/19/2019   Lab Results  Component Value Date   WBC 5.5 12/19/2019   HGB 13.8 12/19/2019   HCT 41.9 12/19/2019   MCV 89.9 12/19/2019   PLT 203 12/19/2019   Lab Results  Component Value Date   NA 137 12/19/2019   K 3.8 12/19/2019   CO2 25 12/19/2019   GLUCOSE 84 12/19/2019   BUN 16 12/19/2019   CREATININE 0.47 (L) 12/19/2019   BILITOT 0.5 12/19/2019   AST 16 12/19/2019   ALT 11 12/19/2019   PROT 7.1 12/19/2019   CALCIUM 9.0 12/19/2019   Lab Results  Component  Value Date   CHOL 189 12/19/2019   Lab Results  Component Value Date   HDL 42 (L) 12/19/2019   Lab Results  Component Value  Date   LDLCALC 126 (H) 12/19/2019   Lab Results  Component Value Date   TRIG 104 12/19/2019   Lab Results  Component Value Date   CHOLHDL 4.5 12/19/2019   No results found for: HGBA1C    Assessment & Plan:   Problem List Items Addressed This Visit       Nervous and Auditory   Quadriplegia and quadriparesis ([place])    Stable        Genitourinary   Endometrial cancer ([place])    Patient is a status post hysterectomy        Other   Wheelchair dependence    Patient is comfortable heart is regular chest is clear      Screening for colon cancer - Primary    Patient will have Cologuard screening test      Relevant Orders   Cologuard    No orders of the defined types were placed in this encounter.   Follow-up: No follow-ups on file.    [name], MD

## 2021-04-21 NOTE — Assessment & Plan Note (Signed)
Patient is a status post hysterectomy

## 2021-04-23 DIAGNOSIS — Z466 Encounter for fitting and adjustment of urinary device: Secondary | ICD-10-CM | POA: Diagnosis not present

## 2021-04-23 DIAGNOSIS — Z7401 Bed confinement status: Secondary | ICD-10-CM | POA: Diagnosis not present

## 2021-04-23 DIAGNOSIS — G834 Cauda equina syndrome: Secondary | ICD-10-CM | POA: Diagnosis not present

## 2021-04-23 DIAGNOSIS — G8250 Quadriplegia, unspecified: Secondary | ICD-10-CM | POA: Diagnosis not present

## 2021-04-23 DIAGNOSIS — Z9181 History of falling: Secondary | ICD-10-CM | POA: Diagnosis not present

## 2021-05-04 DIAGNOSIS — Z7401 Bed confinement status: Secondary | ICD-10-CM | POA: Diagnosis not present

## 2021-05-04 DIAGNOSIS — Z466 Encounter for fitting and adjustment of urinary device: Secondary | ICD-10-CM | POA: Diagnosis not present

## 2021-05-04 DIAGNOSIS — G834 Cauda equina syndrome: Secondary | ICD-10-CM | POA: Diagnosis not present

## 2021-05-04 DIAGNOSIS — Z9181 History of falling: Secondary | ICD-10-CM | POA: Diagnosis not present

## 2021-05-04 DIAGNOSIS — G8250 Quadriplegia, unspecified: Secondary | ICD-10-CM | POA: Diagnosis not present

## 2021-05-06 DIAGNOSIS — Z87440 Personal history of urinary (tract) infections: Secondary | ICD-10-CM | POA: Diagnosis not present

## 2021-05-06 DIAGNOSIS — R339 Retention of urine, unspecified: Secondary | ICD-10-CM | POA: Diagnosis not present

## 2021-05-06 DIAGNOSIS — G8250 Quadriplegia, unspecified: Secondary | ICD-10-CM | POA: Diagnosis not present

## 2021-05-11 DIAGNOSIS — Z9181 History of falling: Secondary | ICD-10-CM | POA: Diagnosis not present

## 2021-05-11 DIAGNOSIS — Z466 Encounter for fitting and adjustment of urinary device: Secondary | ICD-10-CM | POA: Diagnosis not present

## 2021-05-11 DIAGNOSIS — G834 Cauda equina syndrome: Secondary | ICD-10-CM | POA: Diagnosis not present

## 2021-05-11 DIAGNOSIS — G8250 Quadriplegia, unspecified: Secondary | ICD-10-CM | POA: Diagnosis not present

## 2021-05-11 DIAGNOSIS — Z7401 Bed confinement status: Secondary | ICD-10-CM | POA: Diagnosis not present

## 2021-05-18 ENCOUNTER — Encounter: Payer: Self-pay | Admitting: Internal Medicine

## 2021-06-04 DIAGNOSIS — G8250 Quadriplegia, unspecified: Secondary | ICD-10-CM | POA: Diagnosis not present

## 2021-06-04 DIAGNOSIS — Z466 Encounter for fitting and adjustment of urinary device: Secondary | ICD-10-CM | POA: Diagnosis not present

## 2021-06-04 DIAGNOSIS — Z9181 History of falling: Secondary | ICD-10-CM | POA: Diagnosis not present

## 2021-06-04 DIAGNOSIS — Z7401 Bed confinement status: Secondary | ICD-10-CM | POA: Diagnosis not present

## 2021-06-04 DIAGNOSIS — G834 Cauda equina syndrome: Secondary | ICD-10-CM | POA: Diagnosis not present

## 2021-06-04 DIAGNOSIS — Z792 Long term (current) use of antibiotics: Secondary | ICD-10-CM | POA: Diagnosis not present

## 2021-06-05 DIAGNOSIS — G8250 Quadriplegia, unspecified: Secondary | ICD-10-CM | POA: Diagnosis not present

## 2021-06-05 DIAGNOSIS — R339 Retention of urine, unspecified: Secondary | ICD-10-CM | POA: Diagnosis not present

## 2021-06-05 DIAGNOSIS — Z87440 Personal history of urinary (tract) infections: Secondary | ICD-10-CM | POA: Diagnosis not present

## 2021-06-08 DIAGNOSIS — Z1211 Encounter for screening for malignant neoplasm of colon: Secondary | ICD-10-CM | POA: Diagnosis not present

## 2021-06-10 DIAGNOSIS — R339 Retention of urine, unspecified: Secondary | ICD-10-CM | POA: Diagnosis not present

## 2021-06-10 DIAGNOSIS — G8250 Quadriplegia, unspecified: Secondary | ICD-10-CM | POA: Diagnosis not present

## 2021-06-10 DIAGNOSIS — Z87440 Personal history of urinary (tract) infections: Secondary | ICD-10-CM | POA: Diagnosis not present

## 2021-06-17 LAB — COLOGUARD: COLOGUARD: POSITIVE — AB

## 2021-06-22 DIAGNOSIS — G8250 Quadriplegia, unspecified: Secondary | ICD-10-CM | POA: Diagnosis not present

## 2021-06-22 DIAGNOSIS — Z7401 Bed confinement status: Secondary | ICD-10-CM | POA: Diagnosis not present

## 2021-06-22 DIAGNOSIS — Z9181 History of falling: Secondary | ICD-10-CM | POA: Diagnosis not present

## 2021-06-22 DIAGNOSIS — Z466 Encounter for fitting and adjustment of urinary device: Secondary | ICD-10-CM | POA: Diagnosis not present

## 2021-06-22 DIAGNOSIS — Z792 Long term (current) use of antibiotics: Secondary | ICD-10-CM | POA: Diagnosis not present

## 2021-06-22 DIAGNOSIS — G834 Cauda equina syndrome: Secondary | ICD-10-CM | POA: Diagnosis not present

## 2021-07-01 DIAGNOSIS — G8250 Quadriplegia, unspecified: Secondary | ICD-10-CM | POA: Diagnosis not present

## 2021-07-01 DIAGNOSIS — Z9181 History of falling: Secondary | ICD-10-CM | POA: Diagnosis not present

## 2021-07-01 DIAGNOSIS — G834 Cauda equina syndrome: Secondary | ICD-10-CM | POA: Diagnosis not present

## 2021-07-01 DIAGNOSIS — Z7401 Bed confinement status: Secondary | ICD-10-CM | POA: Diagnosis not present

## 2021-07-01 DIAGNOSIS — Z792 Long term (current) use of antibiotics: Secondary | ICD-10-CM | POA: Diagnosis not present

## 2021-07-01 DIAGNOSIS — Z466 Encounter for fitting and adjustment of urinary device: Secondary | ICD-10-CM | POA: Diagnosis not present

## 2021-07-10 DIAGNOSIS — R339 Retention of urine, unspecified: Secondary | ICD-10-CM | POA: Diagnosis not present

## 2021-07-10 DIAGNOSIS — Z87440 Personal history of urinary (tract) infections: Secondary | ICD-10-CM | POA: Diagnosis not present

## 2021-07-10 DIAGNOSIS — G8250 Quadriplegia, unspecified: Secondary | ICD-10-CM | POA: Diagnosis not present

## 2021-07-28 DIAGNOSIS — G8250 Quadriplegia, unspecified: Secondary | ICD-10-CM | POA: Diagnosis not present

## 2021-07-28 DIAGNOSIS — Z466 Encounter for fitting and adjustment of urinary device: Secondary | ICD-10-CM | POA: Diagnosis not present

## 2021-07-28 DIAGNOSIS — Z9181 History of falling: Secondary | ICD-10-CM | POA: Diagnosis not present

## 2021-07-28 DIAGNOSIS — Z792 Long term (current) use of antibiotics: Secondary | ICD-10-CM | POA: Diagnosis not present

## 2021-07-28 DIAGNOSIS — Z7401 Bed confinement status: Secondary | ICD-10-CM | POA: Diagnosis not present

## 2021-07-28 DIAGNOSIS — G834 Cauda equina syndrome: Secondary | ICD-10-CM | POA: Diagnosis not present

## 2021-08-10 DIAGNOSIS — R339 Retention of urine, unspecified: Secondary | ICD-10-CM | POA: Diagnosis not present

## 2021-08-10 DIAGNOSIS — G8250 Quadriplegia, unspecified: Secondary | ICD-10-CM | POA: Diagnosis not present

## 2021-08-10 DIAGNOSIS — Z87440 Personal history of urinary (tract) infections: Secondary | ICD-10-CM | POA: Diagnosis not present

## 2021-08-12 ENCOUNTER — Other Ambulatory Visit: Payer: Self-pay

## 2021-08-14 DIAGNOSIS — G8250 Quadriplegia, unspecified: Secondary | ICD-10-CM | POA: Diagnosis not present

## 2021-08-14 DIAGNOSIS — Z9181 History of falling: Secondary | ICD-10-CM | POA: Diagnosis not present

## 2021-08-14 DIAGNOSIS — G834 Cauda equina syndrome: Secondary | ICD-10-CM | POA: Diagnosis not present

## 2021-08-14 DIAGNOSIS — Z792 Long term (current) use of antibiotics: Secondary | ICD-10-CM | POA: Diagnosis not present

## 2021-08-14 DIAGNOSIS — Z466 Encounter for fitting and adjustment of urinary device: Secondary | ICD-10-CM | POA: Diagnosis not present

## 2021-08-14 DIAGNOSIS — Z7401 Bed confinement status: Secondary | ICD-10-CM | POA: Diagnosis not present

## 2021-08-18 ENCOUNTER — Ambulatory Visit: Payer: Medicare Other | Admitting: Internal Medicine

## 2021-08-18 DIAGNOSIS — R339 Retention of urine, unspecified: Secondary | ICD-10-CM | POA: Diagnosis not present

## 2021-08-18 DIAGNOSIS — G8250 Quadriplegia, unspecified: Secondary | ICD-10-CM | POA: Diagnosis not present

## 2021-08-18 DIAGNOSIS — Z87440 Personal history of urinary (tract) infections: Secondary | ICD-10-CM | POA: Diagnosis not present

## 2021-08-25 ENCOUNTER — Ambulatory Visit (INDEPENDENT_AMBULATORY_CARE_PROVIDER_SITE_OTHER): Payer: Medicare Other | Admitting: Internal Medicine

## 2021-08-25 ENCOUNTER — Encounter: Payer: Self-pay | Admitting: Internal Medicine

## 2021-08-25 VITALS — BP 117/71 | HR 71 | Ht 66.0 in

## 2021-08-25 DIAGNOSIS — G8253 Quadriplegia, C5-C7 complete: Secondary | ICD-10-CM

## 2021-08-25 DIAGNOSIS — Z1231 Encounter for screening mammogram for malignant neoplasm of breast: Secondary | ICD-10-CM | POA: Diagnosis not present

## 2021-08-25 DIAGNOSIS — Z993 Dependence on wheelchair: Secondary | ICD-10-CM

## 2021-08-25 DIAGNOSIS — C541 Malignant neoplasm of endometrium: Secondary | ICD-10-CM | POA: Diagnosis not present

## 2021-08-25 DIAGNOSIS — R32 Unspecified urinary incontinence: Secondary | ICD-10-CM | POA: Diagnosis not present

## 2021-08-25 NOTE — Assessment & Plan Note (Signed)
Due to quadriplegia

## 2021-08-25 NOTE — Assessment & Plan Note (Signed)
Patient has an indwelling Foley catheter, urine was not cloudy.  No blood was noted.  On gross examination

## 2021-08-25 NOTE — Assessment & Plan Note (Signed)
She is stable now denies any pain abdomen is soft nontender there is no lymphadenopathy in the neck.

## 2021-08-25 NOTE — Progress Notes (Signed)
Established Patient Office Visit  Subjective:  Patient ID: Heidi Hess, female    DOB: 03-Mar-1961  Age: 61 y.o. MRN: [id number]  CC:  Chief Complaint  Patient presents with   Follow-up    Patient is here for a general     HPI  Heidi Hess presents for check up  History reviewed. No pertinent past medical history.  Past Surgical History:  Procedure Laterality Date   ROBOTIC ASSISTED SUPRACERVICAL HYSTERECTOMY WITH BILATERAL SALPINGO OOPHERECTOMY Bilateral 03/30/2018   Uterus, cervix, bilateral fallopian tubes and ovaries, hysterectomy and bilateral salpingo-oophorectomy    Family History  Problem Relation Age of Onset   Breast cancer Paternal Aunt    Breast cancer Cousin        maternal    Social History   Socioeconomic History   Marital status: Single    Spouse name: Not on file   Number of children: Not on file   Years of education: 12   Highest education level: 12th grade  Occupational History   Not on file  Tobacco Use   Smoking status: Former    Types: Cigarettes    Quit date: 2010    Years since quitting: 13.4    Passive exposure: Past   Smokeless tobacco: Never  Vaping Use   Vaping Use: Never used  Substance and Sexual Activity   Alcohol use: Not Currently   Drug use: Never   Sexual activity: Not Currently  Other Topics Concern   Not on file  Social History Narrative   Not on file   Social Determinants of Health   Financial Resource Strain: Low Risk  (12/02/2020)   Overall Financial Resource Strain (CARDIA)    Difficulty of Paying Living Expenses: Not hard at all  Food Insecurity: No Food Insecurity (12/02/2020)   Hunger Vital Sign    Worried About Running Out of Food in the Last Year: Never true    Ran Out of Food in the Last Year: Never true  Transportation Needs: No Transportation Needs (12/02/2020)   PRAPARE - Hydrologist (Medical): No    Lack of Transportation (Non-Medical): No  Physical Activity:  Inactive (12/02/2020)   Exercise Vital Sign    Days of Exercise per Week: 0 days    Minutes of Exercise per Session: 0 min  Stress: No Stress Concern Present (12/02/2020)   [place]    Feeling of Stress : Only a little  Social Connections: Unknown (12/02/2020)   Social Connection and Isolation Panel [NHANES]    Frequency of Communication with Friends and Family: More than three times a week    Frequency of Social Gatherings with Friends and Family: More than three times a week    Attends Religious Services: More than 4 times per year    Active Member of [employer] or Organizations: No    Attends Archivist Meetings: Never    Marital Status: Not on file  Intimate Partner Violence: Not At Risk (12/02/2020)   Humiliation, Afraid, Rape, and Kick questionnaire    Fear of Current or Ex-Partner: No    Emotionally Abused: No    Physically Abused: No    Sexually Abused: No     Current Outpatient Medications:    Acetaminophen 325 MG CAPS, , Disp: , Rfl:    Ascorbic Acid (VITAMIN C) 500 MG CHEW, Chew 500 mg by mouth daily., Disp: , Rfl:    baclofen (LIORESAL) 10  MG tablet, Take 1 tablet (10 mg total) by mouth 3 (three) times daily., Disp: 90 each, Rfl: 6   bisacodyl (DULCOLAX) 10 MG suppository, Place 10 mg rectally daily., Disp: , Rfl:    Calcium Carb-Cholecalciferol (OYSTER SHELL CALCIUM) 500-400 MG-UNIT TABS, , Disp: , Rfl:    Control Gel Formula Dressing (DUODERM CGF EXTRA THIN) MISC, Apply 1 each topically once a week., Disp: 4 each, Rfl: 0   Docusate Sodium (DSS) 250 MG CAPS, , Disp: , Rfl:    fluconazole (DIFLUCAN) 150 MG tablet, Take 1 tablet (150 mg total) by mouth daily., Disp: 30 tablet, Rfl: 3   loratadine (CLARITIN) 10 MG tablet, Take 10 mg by mouth daily., Disp: , Rfl:    Melatonin 1 MG CAPS, Take 1 capsule by mouth at bedtime as needed., Disp: , Rfl:    Multiple Vitamins-Minerals (CENTRUM ADULTS) TABS, , Disp:  , Rfl:    nitrofurantoin (MACRODANTIN) 50 MG capsule, Take 1 capsule (50 mg total) by mouth 2 (two) times daily., Disp: 180 capsule, Rfl: 3   nystatin ointment (MYCOSTATIN), Apply 30 g topically daily., Disp: , Rfl:    Olopatadine HCl 0.2 % SOLN, , Disp: , Rfl:    oxybutynin (DITROPAN XL) 15 MG 24 hr tablet, Take 1 tablet (15 mg total) by mouth daily., Disp: 90 tablet, Rfl: 2   Simethicone 180 MG CAPS, Take 180 mg by mouth daily., Disp: , Rfl:    Vitamin D, Cholecalciferol, 25 MCG (1000 UT) TABS, Take 5,000 Units by mouth daily at 2 PM., Disp: , Rfl:    Allergies  Allergen Reactions   Latex Rash   Nickel Dermatitis, Itching and Rash   Sulfa Antibiotics Itching, Nausea Only and Rash    ROS Review of Systems  Constitutional:  Positive for fatigue.  HENT: Negative.  Negative for congestion.   Eyes: Negative.  Negative for pain.  Respiratory: Negative.  Negative for chest tightness.   Cardiovascular: Negative.  Negative for chest pain.  Gastrointestinal: Negative.  Negative for abdominal distention.  Endocrine: Negative.   Genitourinary: Negative.  Negative for dysuria.       Patient having indwelling Foley catheter  Musculoskeletal: Negative.   Skin: Negative.   Allergic/Immunologic: Negative.   Neurological: Negative.        Patient is quadriplegic and came on the wheelchair  Hematological: Negative.   Psychiatric/Behavioral: Negative.    All other systems reviewed and are negative.     Objective:    Physical Exam Constitutional:      Appearance: She is normal weight.  HENT:     Head: Normocephalic.     Nose: Nose normal.  Eyes:     Pupils: Pupils are equal, round, and reactive to light.  Cardiovascular:     Rate and Rhythm: Normal rate and regular rhythm.  Pulmonary:     Effort: Pulmonary effort is normal. No respiratory distress.     Breath sounds: No wheezing.  Abdominal:     General: There is distension.     Palpations: Abdomen is soft.  Musculoskeletal:      Cervical back: No tenderness.  Neurological:     Mental Status: Mental status is at baseline.     BP 117/71   Pulse 71   Ht '5\' 6"'$  (1.676 m)   BMI 27.44 kg/m  Wt Readings from Last 3 Encounters:  06/23/20 170 lb (77.1 kg)  12/19/19 170 lb (77.1 kg)     Health Maintenance Due  Topic Date Due  HIV Screening  Never done   Hepatitis C Screening  Never done    There are no preventive care reminders to display for this patient.  Lab Results  Component Value Date   TSH 1.64 12/19/2019   Lab Results  Component Value Date   WBC 5.5 12/19/2019   HGB 13.8 12/19/2019   HCT 41.9 12/19/2019   MCV 89.9 12/19/2019   PLT 203 12/19/2019   Lab Results  Component Value Date   NA 137 12/19/2019   K 3.8 12/19/2019   CO2 25 12/19/2019   GLUCOSE 84 12/19/2019   BUN 16 12/19/2019   CREATININE 0.47 (L) 12/19/2019   BILITOT 0.5 12/19/2019   AST 16 12/19/2019   ALT 11 12/19/2019   PROT 7.1 12/19/2019   CALCIUM 9.0 12/19/2019   Lab Results  Component Value Date   CHOL 189 12/19/2019   Lab Results  Component Value Date   HDL 42 (L) 12/19/2019   Lab Results  Component Value Date   LDLCALC 126 (H) 12/19/2019   Lab Results  Component Value Date   TRIG 104 12/19/2019   Lab Results  Component Value Date   CHOLHDL 4.5 12/19/2019   No results found for: "HGBA1C"    Assessment & Plan:   Problem List Items Addressed This Visit       Nervous and Auditory   Acute complete quadriplegia due to spinal cord lesion between fifth and seventh cervical vertebra ([place]) - Primary    Patient came in a wheelchair she does not have any bedsores chest is clear heart is regular, she is alert cooperative and is able to drive the wheelchair by herself        Genitourinary   Endometrial cancer ([place])    She is stable now denies any pain abdomen is soft nontender there is no lymphadenopathy in the neck.        Other   Incontinence in female    Patient has an indwelling Foley catheter,  urine was not cloudy.  No blood was noted.  On gross examination      Wheelchair dependence    Due to quadriplegia       No orders of the defined types were placed in this encounter.   Follow-up: No follow-ups on file.    [name], MD

## 2021-08-25 NOTE — Assessment & Plan Note (Signed)
Patient came in a wheelchair she does not have any bedsores chest is clear heart is regular, she is alert cooperative and is able to drive the wheelchair by herself

## 2021-09-01 ENCOUNTER — Other Ambulatory Visit: Payer: Self-pay | Admitting: Internal Medicine

## 2021-09-02 DIAGNOSIS — G834 Cauda equina syndrome: Secondary | ICD-10-CM | POA: Diagnosis not present

## 2021-09-02 DIAGNOSIS — G8250 Quadriplegia, unspecified: Secondary | ICD-10-CM | POA: Diagnosis not present

## 2021-09-02 DIAGNOSIS — Z792 Long term (current) use of antibiotics: Secondary | ICD-10-CM | POA: Diagnosis not present

## 2021-09-02 DIAGNOSIS — Z7401 Bed confinement status: Secondary | ICD-10-CM | POA: Diagnosis not present

## 2021-09-02 DIAGNOSIS — Z466 Encounter for fitting and adjustment of urinary device: Secondary | ICD-10-CM | POA: Diagnosis not present

## 2021-09-02 DIAGNOSIS — Z9181 History of falling: Secondary | ICD-10-CM | POA: Diagnosis not present

## 2021-09-03 MED ORDER — OLOPATADINE HCL 0.2 % OP SOLN: 1.0000 [drp] | Freq: Every day | OPHTHALMIC | 1 refills | Status: AC

## 2021-09-17 DIAGNOSIS — R339 Retention of urine, unspecified: Secondary | ICD-10-CM | POA: Diagnosis not present

## 2021-09-17 DIAGNOSIS — Z87440 Personal history of urinary (tract) infections: Secondary | ICD-10-CM | POA: Diagnosis not present

## 2021-09-17 DIAGNOSIS — G8250 Quadriplegia, unspecified: Secondary | ICD-10-CM | POA: Diagnosis not present

## 2021-09-24 DIAGNOSIS — Z9181 History of falling: Secondary | ICD-10-CM | POA: Diagnosis not present

## 2021-09-24 DIAGNOSIS — G8250 Quadriplegia, unspecified: Secondary | ICD-10-CM | POA: Diagnosis not present

## 2021-09-24 DIAGNOSIS — G834 Cauda equina syndrome: Secondary | ICD-10-CM | POA: Diagnosis not present

## 2021-09-24 DIAGNOSIS — Z993 Dependence on wheelchair: Secondary | ICD-10-CM | POA: Diagnosis not present

## 2021-09-24 DIAGNOSIS — Z466 Encounter for fitting and adjustment of urinary device: Secondary | ICD-10-CM | POA: Diagnosis not present

## 2021-09-24 DIAGNOSIS — Z792 Long term (current) use of antibiotics: Secondary | ICD-10-CM | POA: Diagnosis not present

## 2021-09-30 DIAGNOSIS — Z9181 History of falling: Secondary | ICD-10-CM | POA: Diagnosis not present

## 2021-09-30 DIAGNOSIS — G834 Cauda equina syndrome: Secondary | ICD-10-CM | POA: Diagnosis not present

## 2021-09-30 DIAGNOSIS — Z466 Encounter for fitting and adjustment of urinary device: Secondary | ICD-10-CM | POA: Diagnosis not present

## 2021-09-30 DIAGNOSIS — Z993 Dependence on wheelchair: Secondary | ICD-10-CM | POA: Diagnosis not present

## 2021-09-30 DIAGNOSIS — Z792 Long term (current) use of antibiotics: Secondary | ICD-10-CM | POA: Diagnosis not present

## 2021-09-30 DIAGNOSIS — G8250 Quadriplegia, unspecified: Secondary | ICD-10-CM | POA: Diagnosis not present

## 2021-10-09 ENCOUNTER — Other Ambulatory Visit: Payer: Self-pay | Admitting: Internal Medicine

## 2021-10-19 DIAGNOSIS — Z87440 Personal history of urinary (tract) infections: Secondary | ICD-10-CM | POA: Diagnosis not present

## 2021-10-19 DIAGNOSIS — R339 Retention of urine, unspecified: Secondary | ICD-10-CM | POA: Diagnosis not present

## 2021-10-19 DIAGNOSIS — G8250 Quadriplegia, unspecified: Secondary | ICD-10-CM | POA: Diagnosis not present

## 2021-10-22 DIAGNOSIS — G834 Cauda equina syndrome: Secondary | ICD-10-CM | POA: Diagnosis not present

## 2021-10-22 DIAGNOSIS — Z792 Long term (current) use of antibiotics: Secondary | ICD-10-CM | POA: Diagnosis not present

## 2021-10-22 DIAGNOSIS — Z9181 History of falling: Secondary | ICD-10-CM | POA: Diagnosis not present

## 2021-10-22 DIAGNOSIS — Z466 Encounter for fitting and adjustment of urinary device: Secondary | ICD-10-CM | POA: Diagnosis not present

## 2021-10-22 DIAGNOSIS — G8250 Quadriplegia, unspecified: Secondary | ICD-10-CM | POA: Diagnosis not present

## 2021-10-22 DIAGNOSIS — Z993 Dependence on wheelchair: Secondary | ICD-10-CM | POA: Diagnosis not present

## 2021-10-29 DIAGNOSIS — G834 Cauda equina syndrome: Secondary | ICD-10-CM | POA: Diagnosis not present

## 2021-10-29 DIAGNOSIS — Z792 Long term (current) use of antibiotics: Secondary | ICD-10-CM | POA: Diagnosis not present

## 2021-10-29 DIAGNOSIS — Z9181 History of falling: Secondary | ICD-10-CM | POA: Diagnosis not present

## 2021-10-29 DIAGNOSIS — Z993 Dependence on wheelchair: Secondary | ICD-10-CM | POA: Diagnosis not present

## 2021-10-29 DIAGNOSIS — G8250 Quadriplegia, unspecified: Secondary | ICD-10-CM | POA: Diagnosis not present

## 2021-10-29 DIAGNOSIS — Z466 Encounter for fitting and adjustment of urinary device: Secondary | ICD-10-CM | POA: Diagnosis not present

## 2021-11-11 ENCOUNTER — Other Ambulatory Visit: Payer: Self-pay | Admitting: Internal Medicine

## 2021-11-13 DIAGNOSIS — Z9181 History of falling: Secondary | ICD-10-CM | POA: Diagnosis not present

## 2021-11-13 DIAGNOSIS — G8250 Quadriplegia, unspecified: Secondary | ICD-10-CM | POA: Diagnosis not present

## 2021-11-13 DIAGNOSIS — Z466 Encounter for fitting and adjustment of urinary device: Secondary | ICD-10-CM | POA: Diagnosis not present

## 2021-11-13 DIAGNOSIS — Z792 Long term (current) use of antibiotics: Secondary | ICD-10-CM | POA: Diagnosis not present

## 2021-11-13 DIAGNOSIS — G834 Cauda equina syndrome: Secondary | ICD-10-CM | POA: Diagnosis not present

## 2021-11-13 DIAGNOSIS — Z7401 Bed confinement status: Secondary | ICD-10-CM | POA: Diagnosis not present

## 2021-11-14 ENCOUNTER — Other Ambulatory Visit: Payer: Self-pay | Admitting: *Deleted

## 2021-11-14 DIAGNOSIS — Z1231 Encounter for screening mammogram for malignant neoplasm of breast: Secondary | ICD-10-CM

## 2021-11-19 DIAGNOSIS — R339 Retention of urine, unspecified: Secondary | ICD-10-CM | POA: Diagnosis not present

## 2021-11-19 DIAGNOSIS — G8250 Quadriplegia, unspecified: Secondary | ICD-10-CM | POA: Diagnosis not present

## 2021-11-19 DIAGNOSIS — Z87440 Personal history of urinary (tract) infections: Secondary | ICD-10-CM | POA: Diagnosis not present

## 2021-11-23 DIAGNOSIS — Z87440 Personal history of urinary (tract) infections: Secondary | ICD-10-CM | POA: Diagnosis not present

## 2021-11-23 DIAGNOSIS — G8250 Quadriplegia, unspecified: Secondary | ICD-10-CM | POA: Diagnosis not present

## 2021-11-23 DIAGNOSIS — R339 Retention of urine, unspecified: Secondary | ICD-10-CM | POA: Diagnosis not present

## 2021-12-02 DIAGNOSIS — G834 Cauda equina syndrome: Secondary | ICD-10-CM | POA: Diagnosis not present

## 2021-12-02 DIAGNOSIS — Z9181 History of falling: Secondary | ICD-10-CM | POA: Diagnosis not present

## 2021-12-02 DIAGNOSIS — Z792 Long term (current) use of antibiotics: Secondary | ICD-10-CM | POA: Diagnosis not present

## 2021-12-02 DIAGNOSIS — G8250 Quadriplegia, unspecified: Secondary | ICD-10-CM | POA: Diagnosis not present

## 2021-12-02 DIAGNOSIS — Z7401 Bed confinement status: Secondary | ICD-10-CM | POA: Diagnosis not present

## 2021-12-02 DIAGNOSIS — Z466 Encounter for fitting and adjustment of urinary device: Secondary | ICD-10-CM | POA: Diagnosis not present

## 2021-12-10 ENCOUNTER — Ambulatory Visit
Admission: RE | Admit: 2021-12-10 | Discharge: 2021-12-10 | Disposition: A | Discharge status: Home / Self Care | Payer: Medicare Other | Source: Ambulatory Visit | Attending: Internal Medicine | Admitting: Internal Medicine

## 2021-12-10 DIAGNOSIS — Z1231 Encounter for screening mammogram for malignant neoplasm of breast: Secondary | ICD-10-CM | POA: Diagnosis not present

## 2021-12-14 ENCOUNTER — Other Ambulatory Visit: Payer: Self-pay | Admitting: *Deleted

## 2021-12-19 DIAGNOSIS — R339 Retention of urine, unspecified: Secondary | ICD-10-CM | POA: Diagnosis not present

## 2021-12-19 DIAGNOSIS — Z87440 Personal history of urinary (tract) infections: Secondary | ICD-10-CM | POA: Diagnosis not present

## 2021-12-19 DIAGNOSIS — G8250 Quadriplegia, unspecified: Secondary | ICD-10-CM | POA: Diagnosis not present

## 2021-12-22 ENCOUNTER — Encounter: Payer: Self-pay | Admitting: Internal Medicine

## 2021-12-22 ENCOUNTER — Ambulatory Visit (INDEPENDENT_AMBULATORY_CARE_PROVIDER_SITE_OTHER): Payer: Medicare Other | Admitting: Internal Medicine

## 2021-12-22 VITALS — BP 128/84 | HR 86 | Ht 66.0 in

## 2021-12-22 DIAGNOSIS — E785 Hyperlipidemia, unspecified: Secondary | ICD-10-CM | POA: Diagnosis not present

## 2021-12-22 DIAGNOSIS — R8290 Unspecified abnormal findings in urine: Secondary | ICD-10-CM | POA: Diagnosis not present

## 2021-12-22 DIAGNOSIS — Z993 Dependence on wheelchair: Secondary | ICD-10-CM | POA: Diagnosis not present

## 2021-12-22 DIAGNOSIS — G8250 Quadriplegia, unspecified: Secondary | ICD-10-CM | POA: Diagnosis not present

## 2021-12-22 DIAGNOSIS — R32 Unspecified urinary incontinence: Secondary | ICD-10-CM

## 2021-12-22 DIAGNOSIS — Z0000 Encounter for general adult medical examination without abnormal findings: Secondary | ICD-10-CM

## 2021-12-22 DIAGNOSIS — Z23 Encounter for immunization: Secondary | ICD-10-CM

## 2021-12-22 DIAGNOSIS — Z1329 Encounter for screening for other suspected endocrine disorder: Secondary | ICD-10-CM | POA: Diagnosis not present

## 2021-12-22 DIAGNOSIS — Z1211 Encounter for screening for malignant neoplasm of colon: Secondary | ICD-10-CM | POA: Diagnosis not present

## 2021-12-22 DIAGNOSIS — I10 Essential (primary) hypertension: Secondary | ICD-10-CM | POA: Diagnosis not present

## 2021-12-22 NOTE — Assessment & Plan Note (Signed)
Physical examinatio  Breast is normal, abdominal soft nontender, and not deformed because of quadriplegia, patient is wheelchair-bound.

## 2021-12-22 NOTE — Progress Notes (Signed)
Established Patient Office Visit  Subjective:  Patient ID: Heidi Hess, female    DOB: 10/28/1960  Age: 61 y.o. MRN: [id number]  CC:  Chief Complaint  Patient presents with   quadriplegia   Follow-up    Patient is quadriplegia who can not transfer without assistance and is in need for a new hoyer lift with transfer pad.     HPI  [name] presents for check up c/o mild discoloration of urine  History reviewed. No pertinent past medical history.  Past Surgical History:  Procedure Laterality Date   ROBOTIC ASSISTED SUPRACERVICAL HYSTERECTOMY WITH BILATERAL SALPINGO OOPHERECTOMY Bilateral 03/30/2018   Uterus, cervix, bilateral fallopian tubes and ovaries, hysterectomy and bilateral salpingo-oophorectomy    Family History  Problem Relation Age of Onset   Breast cancer Paternal Aunt    Breast cancer Cousin        maternal    Social History   Socioeconomic History   Marital status: Single    Spouse name: Not on file   Number of children: Not on file   Years of education: 12   Highest education level: 12th grade  Occupational History   Not on file  Tobacco Use   Smoking status: Former    Types: Cigarettes    Quit date: 2010    Years since quitting: 13.8    Passive exposure: Past   Smokeless tobacco: Never  Vaping Use   Vaping Use: Never used  Substance and Sexual Activity   Alcohol use: Not Currently   Drug use: Never   Sexual activity: Not Currently  Other Topics Concern   Not on file  Social History Narrative   Not on file   Social Determinants of Health   Financial Resource Strain: Low Risk  (12/02/2020)   Overall Financial Resource Strain (CARDIA)    Difficulty of Paying Living Expenses: Not hard at all  Food Insecurity: No Food Insecurity (12/02/2020)   Hunger Vital Sign    Worried About Running Out of Food in the Last Year: Never true    Ran Out of Food in the Last Year: Never true  Transportation Needs: No Transportation Needs (12/02/2020)    PRAPARE - Hydrologist (Medical): No    Lack of Transportation (Non-Medical): No  Physical Activity: Inactive (12/02/2020)   Exercise Vital Sign    Days of Exercise per Week: 0 days    Minutes of Exercise per Session: 0 min  Stress: No Stress Concern Present (12/02/2020)   [place]    Feeling of Stress : Only a little  Social Connections: Unknown (12/02/2020)   Social Connection and Isolation Panel [NHANES]    Frequency of Communication with Friends and Family: More than three times a week    Frequency of Social Gatherings with Friends and Family: More than three times a week    Attends Religious Services: More than 4 times per year    Active Member of [employer] or Organizations: No    Attends Archivist Meetings: Never    Marital Status: Not on file  Intimate Partner Violence: Not At Risk (12/02/2020)   Humiliation, Afraid, Rape, and Kick questionnaire    Fear of Current or Ex-Partner: No    Emotionally Abused: No    Physically Abused: No    Sexually Abused: No     Current Outpatient Medications:    Acetaminophen 325 MG CAPS, , Disp: , Rfl:  Ascorbic Acid (VITAMIN C) 500 MG CHEW, Chew 500 mg by mouth daily., Disp: , Rfl:    baclofen (LIORESAL) 10 MG tablet, Take 1 tablet (10 mg total) by mouth 3 (three) times daily., Disp: 90 tablet, Rfl: 0   bisacodyl (DULCOLAX) 10 MG suppository, Place 10 mg rectally daily., Disp: , Rfl:    Calcium Carb-Cholecalciferol (OYSTER SHELL CALCIUM) 500-400 MG-UNIT TABS, , Disp: , Rfl:    Control Gel Formula Dressing (DUODERM CGF EXTRA THIN) MISC, Apply 1 each topically once a week., Disp: 4 each, Rfl: 0   Docusate Sodium (DSS) 250 MG CAPS, , Disp: , Rfl:    fluconazole (DIFLUCAN) 150 MG tablet, Take 1 tablet (150 mg total) by mouth daily., Disp: 30 tablet, Rfl: 3   loratadine (CLARITIN) 10 MG tablet, Take 10 mg by mouth daily., Disp: , Rfl:     Melatonin 1 MG CAPS, Take 1 capsule by mouth at bedtime as needed., Disp: , Rfl:    Multiple Vitamins-Minerals (CENTRUM ADULTS) TABS, , Disp: , Rfl:    nitrofurantoin (MACRODANTIN) 50 MG capsule, Take 1 capsule (50 mg total) by mouth 2 (two) times daily., Disp: 180 capsule, Rfl: 3   nystatin ointment (MYCOSTATIN), Apply 30 g topically daily., Disp: , Rfl:    Olopatadine HCl 0.2 % SOLN, Place 1 drop into both eyes daily at 2 PM., Disp: 2.5 mL, Rfl: 1   oxybutynin (DITROPAN XL) 15 MG 24 hr tablet, Take 1 tablet (15 mg total) by mouth daily., Disp: 90 tablet, Rfl: 2   Simethicone 180 MG CAPS, Take 180 mg by mouth daily., Disp: , Rfl:    Vitamin D, Cholecalciferol, 25 MCG (1000 UT) TABS, Take 5,000 Units by mouth daily at 2 PM., Disp: , Rfl:    Allergies  Allergen Reactions   Latex Rash   Nickel Dermatitis, Itching and Rash   Sulfa Antibiotics Itching, Nausea Only and Rash    ROS Review of Systems  Constitutional:  Negative for appetite change and chills.  HENT:  Negative for congestion.   Eyes:  Negative for itching.  Respiratory:  Negative for cough and wheezing.   Cardiovascular:  Negative for chest pain.  Genitourinary:        Abnormal urine sediment  Neurological:        Wheel chair bound  , quadriplegia      Objective:    Physical Exam Constitutional:      Appearance: She is normal weight.  HENT:     Head: Normocephalic.     Nose: Nose normal.     Mouth/Throat:     Mouth: Mucous membranes are moist.     Pharynx: No oropharyngeal exudate or posterior oropharyngeal erythema.  Neck:     Vascular: No carotid bruit.  Cardiovascular:     Rate and Rhythm: Regular rhythm.  Pulmonary:     Effort: Pulmonary effort is normal.  Musculoskeletal:        General: Deformity present.     Cervical back: Neck supple.     Comments: quadriplegic  Skin:    General: Skin is warm.  Neurological:     Mental Status: She is alert and oriented to person, place, and time. Mental status is  at baseline.     BP 128/84   Pulse 86   Ht '5\' 6"'$  (1.676 m)   BMI 27.44 kg/m  Wt Readings from Last 3 Encounters:  06/23/20 170 lb (77.1 kg)  12/19/19 170 lb (77.1 kg)     There are  no preventive care reminders to display for this patient.  There are no preventive care reminders to display for this patient.  Lab Results  Component Value Date   TSH 1.64 12/19/2019   Lab Results  Component Value Date   WBC 5.5 12/19/2019   HGB 13.8 12/19/2019   HCT 41.9 12/19/2019   MCV 89.9 12/19/2019   PLT 203 12/19/2019   Lab Results  Component Value Date   NA 137 12/19/2019   K 3.8 12/19/2019   CO2 25 12/19/2019   GLUCOSE 84 12/19/2019   BUN 16 12/19/2019   CREATININE 0.47 (L) 12/19/2019   BILITOT 0.5 12/19/2019   AST 16 12/19/2019   ALT 11 12/19/2019   PROT 7.1 12/19/2019   CALCIUM 9.0 12/19/2019   Lab Results  Component Value Date   CHOL 189 12/19/2019   Lab Results  Component Value Date   HDL 42 (L) 12/19/2019   Lab Results  Component Value Date   LDLCALC 126 (H) 12/19/2019   Lab Results  Component Value Date   TRIG 104 12/19/2019   Lab Results  Component Value Date   CHOLHDL 4.5 12/19/2019   No results found for: "HGBA1C"    Assessment & Plan:   Problem List Items Addressed This Visit       Nervous and Auditory   Quadriplegia and quadriparesis ([place])    Patient is wheelchair-bound, she also has a Foley cath, she denies any chest pain or shortness of breath        Other   Incontinence in female    We will change the Foley catheter and check the urine        Wheelchair dependence   Screening for colon cancer - Primary    Cologuard test is positive, patient does not want colonoscopy, mammogram is negative.  The breast is okay on physical exam, abdomen is soft nontender      Need for influenza vaccination   Annual physical exam    Physical examinatio  Breast is normal, abdominal soft nontender, and not deformed because of quadriplegia,  patient is wheelchair-bound.      Other Visit Diagnoses     Preventative health care       Relevant Orders   CBC with Differential/Platelet   COMPLETE METABOLIC PANEL WITH GFR   Lipid panel   TSH   PSA   Foul smelling urine       Relevant Orders   Urine Culture   Urinalysis       No orders of the defined types were placed in this encounter.   Follow-up: Patient needs a [employer] fast streamer .Marland Kitchen  Urine analysis [name], MD

## 2021-12-22 NOTE — Assessment & Plan Note (Signed)
We will change the Foley catheter and check the urine

## 2021-12-22 NOTE — Assessment & Plan Note (Signed)
Cologuard test is positive, patient does not want colonoscopy, mammogram is negative.  The breast is okay on physical exam, abdomen is soft nontender

## 2021-12-22 NOTE — Assessment & Plan Note (Signed)
Patient is wheelchair-bound, she also has a Foley cath, she denies any chest pain or shortness of breath

## 2021-12-23 LAB — CBC WITH DIFFERENTIAL/PLATELET
Absolute Monocytes: 440 cells/uL (ref 200–950)
Basophils Absolute: 50 cells/uL (ref 0–200)
Basophils Relative: 0.8 %
Eosinophils Absolute: 180 cells/uL (ref 15–500)
Eosinophils Relative: 2.9 %
HCT: 41.5 % (ref 35.0–45.0)
Hemoglobin: 14 g/dL (ref 11.7–15.5)
Lymphs Abs: 2530 cells/uL (ref 850–3900)
MCH: 30.3 pg (ref 27.0–33.0)
MCHC: 33.7 g/dL (ref 32.0–36.0)
MCV: 89.8 fL (ref 80.0–100.0)
MPV: 12.2 fL (ref 7.5–12.5)
Monocytes Relative: 7.1 %
Neutro Abs: 3001 cells/uL (ref 1500–7800)
Neutrophils Relative %: 48.4 %
Platelets: 213 10*3/uL (ref 140–400)
RBC: 4.62 10*6/uL (ref 3.80–5.10)
RDW: 12.4 % (ref 11.0–15.0)
Total Lymphocyte: 40.8 %
WBC: 6.2 10*3/uL (ref 3.8–10.8)

## 2021-12-23 LAB — COMPLETE METABOLIC PANEL WITH GFR
AG Ratio: 1.3 (calc) (ref 1.0–2.5)
ALT: 9 U/L (ref 6–29)
AST: 13 U/L (ref 10–35)
Albumin: 3.8 g/dL (ref 3.6–5.1)
Alkaline phosphatase (APISO): 99 U/L (ref 37–153)
BUN/Creatinine Ratio: 23 (calc) — ABNORMAL HIGH (ref 6–22)
BUN: 11 mg/dL (ref 7–25)
CO2: 24 mmol/L (ref 20–32)
Calcium: 8.9 mg/dL (ref 8.6–10.4)
Chloride: 102 mmol/L (ref 98–110)
Creat: 0.47 mg/dL — ABNORMAL LOW (ref 0.50–1.05)
Globulin: 3 g/dL (calc) (ref 1.9–3.7)
Glucose, Bld: 82 mg/dL (ref 65–99)
Potassium: 4.1 mmol/L (ref 3.5–5.3)
Sodium: 136 mmol/L (ref 135–146)
Total Bilirubin: 0.5 mg/dL (ref 0.2–1.2)
Total Protein: 6.8 g/dL (ref 6.1–8.1)
eGFR: 108 mL/min/{1.73_m2} (ref >=60)

## 2021-12-23 LAB — PSA: PSA: 0.05 ng/mL

## 2021-12-23 LAB — LIPID PANEL
Cholesterol: 186 mg/dL (ref <200)
HDL: 34 mg/dL — ABNORMAL LOW (ref >=50)
LDL Cholesterol (Calc): 127 mg/dL (calc) — ABNORMAL HIGH
Non-HDL Cholesterol (Calc): 152 mg/dL (calc) — ABNORMAL HIGH (ref <130)
Total CHOL/HDL Ratio: 5.5 (calc) — ABNORMAL HIGH (ref <5.0)
Triglycerides: 137 mg/dL (ref <150)

## 2021-12-23 LAB — TSH: TSH: 1.86 mIU/L (ref 0.40–4.50)

## 2021-12-24 DIAGNOSIS — G834 Cauda equina syndrome: Secondary | ICD-10-CM | POA: Diagnosis not present

## 2021-12-24 DIAGNOSIS — G8250 Quadriplegia, unspecified: Secondary | ICD-10-CM | POA: Diagnosis not present

## 2021-12-24 DIAGNOSIS — Z466 Encounter for fitting and adjustment of urinary device: Secondary | ICD-10-CM | POA: Diagnosis not present

## 2021-12-24 DIAGNOSIS — N390 Urinary tract infection, site not specified: Secondary | ICD-10-CM | POA: Diagnosis not present

## 2021-12-24 DIAGNOSIS — Z7401 Bed confinement status: Secondary | ICD-10-CM | POA: Diagnosis not present

## 2021-12-24 DIAGNOSIS — Z9181 History of falling: Secondary | ICD-10-CM | POA: Diagnosis not present

## 2021-12-24 DIAGNOSIS — Z792 Long term (current) use of antibiotics: Secondary | ICD-10-CM | POA: Diagnosis not present

## 2021-12-25 ENCOUNTER — Encounter: Payer: Self-pay | Admitting: Internal Medicine

## 2021-12-25 DIAGNOSIS — G8250 Quadriplegia, unspecified: Secondary | ICD-10-CM | POA: Diagnosis not present

## 2021-12-28 ENCOUNTER — Other Ambulatory Visit: Payer: Self-pay | Admitting: *Deleted

## 2021-12-28 DIAGNOSIS — Z792 Long term (current) use of antibiotics: Secondary | ICD-10-CM | POA: Diagnosis not present

## 2021-12-28 DIAGNOSIS — Z9181 History of falling: Secondary | ICD-10-CM | POA: Diagnosis not present

## 2021-12-28 DIAGNOSIS — G8250 Quadriplegia, unspecified: Secondary | ICD-10-CM | POA: Diagnosis not present

## 2021-12-28 DIAGNOSIS — Z7401 Bed confinement status: Secondary | ICD-10-CM | POA: Diagnosis not present

## 2021-12-28 DIAGNOSIS — Z466 Encounter for fitting and adjustment of urinary device: Secondary | ICD-10-CM | POA: Diagnosis not present

## 2021-12-28 DIAGNOSIS — G834 Cauda equina syndrome: Secondary | ICD-10-CM | POA: Diagnosis not present

## 2022-01-04 ENCOUNTER — Encounter (INDEPENDENT_AMBULATORY_CARE_PROVIDER_SITE_OTHER): Payer: Self-pay

## 2022-01-11 ENCOUNTER — Telehealth: Payer: Self-pay

## 2022-01-11 NOTE — Telephone Encounter (Signed)
We sent order for new semi electric bed and gel overlay but they need the notes that have the narrative showing the need to have those. Caryl Pina had a note that said she would have that added to the last office note but I do not see that it was done. They need an office note stating the reason she is need of the equipment.

## 2022-01-13 DIAGNOSIS — Z466 Encounter for fitting and adjustment of urinary device: Secondary | ICD-10-CM | POA: Diagnosis not present

## 2022-01-13 DIAGNOSIS — Z7401 Bed confinement status: Secondary | ICD-10-CM | POA: Diagnosis not present

## 2022-01-13 DIAGNOSIS — G834 Cauda equina syndrome: Secondary | ICD-10-CM | POA: Diagnosis not present

## 2022-01-13 DIAGNOSIS — Z9181 History of falling: Secondary | ICD-10-CM | POA: Diagnosis not present

## 2022-01-13 DIAGNOSIS — Z792 Long term (current) use of antibiotics: Secondary | ICD-10-CM | POA: Diagnosis not present

## 2022-01-13 DIAGNOSIS — G8250 Quadriplegia, unspecified: Secondary | ICD-10-CM | POA: Diagnosis not present

## 2022-01-15 NOTE — Progress Notes (Signed)
Patient requires re-positioning of the body in ways that cannot be achieved with an ordinary bed and/or wedge pillow, to eliminate pain, reduce pressure and to prevent pressure sores. Patient is unable to move in bed due to quadriplegia. She lives with her mother who cannot physically move the patient. Patient is incontinent and has a catheter in place and without the ability to change positions risks circulation issues.

## 2022-01-19 DIAGNOSIS — R339 Retention of urine, unspecified: Secondary | ICD-10-CM | POA: Diagnosis not present

## 2022-01-19 DIAGNOSIS — Z87440 Personal history of urinary (tract) infections: Secondary | ICD-10-CM | POA: Diagnosis not present

## 2022-01-19 DIAGNOSIS — G8250 Quadriplegia, unspecified: Secondary | ICD-10-CM | POA: Diagnosis not present

## 2022-01-25 DIAGNOSIS — Z792 Long term (current) use of antibiotics: Secondary | ICD-10-CM | POA: Diagnosis not present

## 2022-01-25 DIAGNOSIS — Z7401 Bed confinement status: Secondary | ICD-10-CM | POA: Diagnosis not present

## 2022-01-25 DIAGNOSIS — G8250 Quadriplegia, unspecified: Secondary | ICD-10-CM | POA: Diagnosis not present

## 2022-01-25 DIAGNOSIS — Z466 Encounter for fitting and adjustment of urinary device: Secondary | ICD-10-CM | POA: Diagnosis not present

## 2022-01-25 DIAGNOSIS — Z9181 History of falling: Secondary | ICD-10-CM | POA: Diagnosis not present

## 2022-01-25 DIAGNOSIS — G834 Cauda equina syndrome: Secondary | ICD-10-CM | POA: Diagnosis not present

## 2022-02-01 ENCOUNTER — Encounter: Payer: Self-pay | Admitting: Internal Medicine

## 2022-02-02 ENCOUNTER — Emergency Department: Payer: Medicare Other

## 2022-02-02 ENCOUNTER — Telehealth: Payer: Self-pay

## 2022-02-02 ENCOUNTER — Other Ambulatory Visit: Payer: Self-pay

## 2022-02-02 ENCOUNTER — Encounter: Payer: Self-pay | Admitting: Emergency Medicine

## 2022-02-02 ENCOUNTER — Emergency Department
Admission: EM | Admit: 2022-02-02 | Discharge: 2022-02-02 | Disposition: A | Discharge status: Home / Self Care | Payer: Medicare Other | Attending: Student in an Organized Health Care Education/Training Program | Admitting: Student in an Organized Health Care Education/Training Program

## 2022-02-02 DIAGNOSIS — Z7401 Bed confinement status: Secondary | ICD-10-CM | POA: Diagnosis not present

## 2022-02-02 DIAGNOSIS — R29898 Other symptoms and signs involving the musculoskeletal system: Secondary | ICD-10-CM | POA: Diagnosis not present

## 2022-02-02 DIAGNOSIS — Z79899 Other long term (current) drug therapy: Secondary | ICD-10-CM | POA: Diagnosis not present

## 2022-02-02 DIAGNOSIS — Z87891 Personal history of nicotine dependence: Secondary | ICD-10-CM | POA: Insufficient documentation

## 2022-02-02 DIAGNOSIS — I10 Essential (primary) hypertension: Secondary | ICD-10-CM | POA: Diagnosis not present

## 2022-02-02 DIAGNOSIS — Z8541 Personal history of malignant neoplasm of cervix uteri: Secondary | ICD-10-CM | POA: Diagnosis not present

## 2022-02-02 DIAGNOSIS — S7221XA Displaced subtrochanteric fracture of right femur, initial encounter for closed fracture: Secondary | ICD-10-CM | POA: Diagnosis not present

## 2022-02-02 DIAGNOSIS — X501XXA Overexertion from prolonged static or awkward postures, initial encounter: Secondary | ICD-10-CM | POA: Diagnosis not present

## 2022-02-02 DIAGNOSIS — M1611 Unilateral primary osteoarthritis, right hip: Secondary | ICD-10-CM | POA: Diagnosis not present

## 2022-02-02 DIAGNOSIS — Z743 Need for continuous supervision: Secondary | ICD-10-CM | POA: Diagnosis not present

## 2022-02-02 DIAGNOSIS — M79604 Pain in right leg: Secondary | ICD-10-CM | POA: Diagnosis present

## 2022-02-02 NOTE — ED Triage Notes (Signed)
Patient to ED via [facility] from home for leg pain. Patient states she heard a "pop" on the right hip yesterday while they were cleaning her. No falls. Denies pain but pt states its swollen.

## 2022-02-02 NOTE — Discharge Instructions (Addendum)
You were also evaluated by the orthopedic surgeon who feels that this is a nonoperative procedure given that you are nonweightbearing.  Remember that you should keep your knees strapped together when you do transfers.  Please check your capillary refill every day as was demonstrated to you.  Please follow up with Dr. Karel Jarvis. Please return for any new, worsening, or change in symptoms or other concerns.

## 2022-02-02 NOTE — ED Provider Triage Note (Signed)
Emergency Medicine Provider Triage Evaluation Note  Heidi Hess , a 61 y.o. female  was evaluated in triage.  Pt complains of swelling to her right hip area. She is a quadriplegic and doesn't know if/when there was any injury, but she doesn't recall anything that would have caused swelling in that area..  Physical Exam  BP 104/80   Pulse 84   Temp 99.5 F (37.5 C) (Oral)   Resp 18   SpO2 95%  Gen:   Awake, no distress   Resp:  Normal effort  MSK:   Moves extremities without difficulty  Other:    Medical Decision Making  Medically screening exam initiated at 2:48 PM.  Appropriate orders placed.  Heidi Hess was informed that the remainder of the evaluation will be completed by another provider, this initial triage assessment does not replace that evaluation, and the importance of remaining in the ED until their evaluation is complete.    Victorino Dike, FNP 02/02/22 1812

## 2022-02-02 NOTE — Consult Note (Signed)
ORTHOPAEDIC CONSULTATION  REQUESTING PHYSICIAN: Merlyn Lot, MD  Chief Complaint:   Right femur fracture  History of Present Illness: Heidi Hess is a 61 y.o. female who presents for evaluation of right proximal thigh swelling which was noticed over the last 2 days.  The patient is a C6 complete quadriplegic who does have some upper extremity function.  The patient has no motor or sensation in the lower extremities or abdomen.  The patient reports that sometime over the past weekend while being moved she sustained an injury to her right proximal thigh.  She is unable to ascertain exactly when the event happened but they did notice some swelling Sunday into Monday of this week in the right thigh.  She has had no pain in the right thigh and minimal bruising but significant swelling.  She has been quadriplegic for over 40 years.  She denies any chest pain, shortness of breath, fevers, chills, or any specific known trauma.  History reviewed. No pertinent past medical history. Past Surgical History:  Procedure Laterality Date   ROBOTIC ASSISTED SUPRACERVICAL HYSTERECTOMY WITH BILATERAL SALPINGO OOPHERECTOMY Bilateral 03/30/2018   Uterus, cervix, bilateral fallopian tubes and ovaries, hysterectomy and bilateral salpingo-oophorectomy   Social History   Socioeconomic History   Marital status: Single    Spouse name: Not on file   Number of children: Not on file   Years of education: 12   Highest education level: 12th grade  Occupational History   Not on file  Tobacco Use   Smoking status: Former    Types: Cigarettes    Quit date: 2010    Years since quitting: 13.9    Passive exposure: Past   Smokeless tobacco: Never  Vaping Use   Vaping Use: Never used  Substance and Sexual Activity   Alcohol use: Not Currently   Drug use: Never   Sexual activity: Not Currently  Other Topics Concern   Not on file  Social History  Narrative   Not on file   Social Determinants of Health   Financial Resource Strain: Low Risk  (12/02/2020)   Overall Financial Resource Strain (CARDIA)    Difficulty of Paying Living Expenses: Not hard at all  Food Insecurity: No Food Insecurity (12/02/2020)   Hunger Vital Sign    Worried About Running Out of Food in the Last Year: Never true    Ran Out of Food in the Last Year: Never true  Transportation Needs: No Transportation Needs (12/02/2020)   PRAPARE - Hydrologist (Medical): No    Lack of Transportation (Non-Medical): No  Physical Activity: Inactive (12/02/2020)   Exercise Vital Sign    Days of Exercise per Week: 0 days    Minutes of Exercise per Session: 0 min  Stress: No Stress Concern Present (12/02/2020)   [place]    Feeling of Stress : Only a little  Social Connections: Unknown (12/02/2020)   Social Connection and Isolation Panel [NHANES]    Frequency of Communication with Friends and Family: More than three times a week    Frequency of Social Gatherings with Friends and Family: More than three times a week    Attends Religious Services: More than 4 times per year    Active Member of [employer] or Organizations: No    Attends Archivist Meetings: Never    Marital Status: Not on file   Family History  Problem Relation Age of Onset   Breast  cancer Paternal Aunt    Breast cancer Cousin        maternal   Allergies  Allergen Reactions   Latex Rash   Nickel Dermatitis, Itching and Rash   Sulfa Antibiotics Itching, Nausea Only and Rash   Prior to Admission medications   Medication Sig Start Date End Date Taking? Authorizing Provider  Acetaminophen 325 MG CAPS     [provider]  Ascorbic Acid (VITAMIN C) 500 MG CHEW Chew 500 mg by mouth daily.    [provider]  baclofen (LIORESAL) 10 MG tablet Take 1 tablet (10 mg total) by mouth 3 (three) times  daily. 11/11/21   [name], MD  bisacodyl (DULCOLAX) 10 MG suppository Place 10 mg rectally daily. 06/17/05   [provider]  Calcium Carb-Cholecalciferol (OYSTER SHELL CALCIUM) 500-400 MG-UNIT TABS     [provider]  Control Gel Formula Dressing (DUODERM CGF EXTRA THIN) MISC Apply 1 each topically once a week. 06/04/20   [name], MD  Docusate Sodium (DSS) 250 MG CAPS     [provider]  fluconazole (DIFLUCAN) 150 MG tablet Take 1 tablet (150 mg total) by mouth daily. 08/14/19   [name], MD  loratadine (CLARITIN) 10 MG tablet Take 10 mg by mouth daily. 06/17/05   [provider]  Melatonin 1 MG CAPS Take 1 capsule by mouth at bedtime as needed.    [provider]  Multiple Vitamins-Minerals (CENTRUM ADULTS) TABS     [provider]  nitrofurantoin (MACRODANTIN) 50 MG capsule Take 1 capsule (50 mg total) by mouth 2 (two) times daily. 04/14/21   [name], MD  nystatin ointment (MYCOSTATIN) Apply 30 g topically daily. 05/09/19   [provider]  Olopatadine HCl 0.2 % SOLN Place 1 drop into both eyes daily at 2 PM. 09/03/21   [name], MD  oxybutynin (DITROPAN XL) 15 MG 24 hr tablet Take 1 tablet (15 mg total) by mouth daily. 04/14/21   [name], MD  Simethicone 180 MG CAPS Take 180 mg by mouth daily.    [provider]  Vitamin D, Cholecalciferol, 25 MCG (1000 UT) TABS Take 5,000 Units by mouth daily at 2 PM.    [provider]   DG Hip Unilat With Pelvis 2-3 Views Right  Result Date: 02/02/2022 CLINICAL DATA:  Right hip swelling.  Felt a pop yesterday. EXAM: DG HIP (WITH OR WITHOUT PELVIS) 2-3V RIGHT COMPARISON:  None Available. FINDINGS: Acute fracture predominantly involving the right subtrochanteric femur with extension into the lesser trochanter. Mild anterior displacement and apex anterior angulation. No dislocation. Bilateral hip osteoarthritis. Osteopenia. Large amount of stool in the  visualized colon. IMPRESSION: 1. Acute mildly displaced and angulated right subtrochanteric femur fracture. Electronically Signed   By: Titus Dubin M.D.   On: 02/02/2022 15:48    Positive ROS: All other systems have been reviewed and were otherwise negative with the exception of those mentioned in the HPI and as above.  Physical Exam: General:  Alert, no acute distress Psychiatric:  Patient is competent for consent with normal mood and affect   Cardiovascular: Mild bilateral pedal edema Respiratory:  No wheezing, non-labored breathing GI:  Abdomen is soft and non-tender Skin:  No lesions in the area of chief complaint Neurologic:  Sensation intact distally Lymphatic:  No axillary or cervical lymphadenopathy  Orthopedic Exam:  Right lower extremity Skin intact mild ecchymosis over proximal thigh with moderate swelling of the thigh, soft and compressible Leg in  external rotation No pain with any motion of the foot, ankle, knee, hip or femur Noted crepitus with motion of the femur palpated to the proximal femur with a sensation of bone grinding on bone during motion.  The leg is able to be straightened without any difficulty or pain Compartments are all soft in the lower extremity. She has good capillary refill over her toes and emergency physician was able to obtain dopplerable pulse per patient she has a weak pulse at baseline and usually requires a Doppler to obtain.  This exam is symmetric to the other lower extremity No sensation of lower extremity per patient at baseline  Secondary survey No pain or crepitus noted with motion of the left lower extremity No pain to palpation over other bony prominences around the body, cervical or thoracic spine, no bony step-off   X-rays:  2 view x-rays of the right proximal femur showing a mildly apex anterior displaced subtrochanteric fracture with noted severe osteopenia and degenerative changes of the right hip joint.  Fracture appears acute  in nature.  Assessment: Right subtrochanteric femur fracture  Plan: I had a long discussion with the patient about her fracture and the natural course and history of femur fractures and treatment options.  We discussed surgical options and nonoperative options given her quadriplegia.  We discussed the risks and benefits of surgery versus nonoperative management including but not limited to damage to nerves, vessels, other structures, nonunion, malunion, infection, need for future surgical interventions.  Given her quadriplegia a surgical intervention with a nail could be an option however there is high risk of nonunion given the lack of weightbearing along with the additional routine risks of a surgical intervention.  The risks of nonoperative management is primarily continued fracture motion with risk to surrounding structures.  At this time she has adequate blood flow to the foot and lower extremity which is at her baseline.  Under shared decision making the patient has elected to proceed with nonoperative management of her fracture at this time.  I advised that she should be particularly careful for the next 4 weeks during transfers and to keep both legs adducted together during [place] lifts and transfers.  We discussed the possibility of her needing a procedure in the future if she develops any complications or malunion.  The patient will follow-up in my office in 2 to 4 weeks for follow-up evaluation and x-rays.  All questions answered the patient agrees with the above plan.    Steffanie Rainwater MD  Beeper #:  [phone]  02/02/2022 6:02 PM

## 2022-02-02 NOTE — ED Notes (Signed)
Pt called family to come pick up. Pt to wait in room as she is quadriplegic. Family will assist with transfer from wheelchair to car.

## 2022-02-02 NOTE — ED Provider Notes (Signed)
[facility] ([place]) Provider Note    Event Date/Time   First MD Initiated Contact with Patient 02/02/22 1508     (approximate)   History   Leg Pain   HPI  Heidi Hess is a 61 y.o. female with a past medical history of quadriplegia at C6, wheelchair-bound who presents today for evaluation of right hip "clicking."  Patient reports that she was being lifted out of her chair into the car by her niece and nephew on Friday and since that time she has had swelling in her leg.  She reports that she has clicking whenever her caretaker tries to move her leg.  She reports that she does not have any feeling in this area so she is unsure if there is any pain.  She denies any other complaints today.  Patient Active Problem List   Diagnosis Date Noted   Closed displaced subtrochanteric fracture of right femur ([place]) 02/02/2022   Annual physical exam 12/22/2020   PVD (peripheral vascular disease) ([place]) 06/25/2020   Need for tetanus booster 06/04/2020   Pressure injury of skin 06/04/2020   Need for COVID-19 vaccine 05/12/2020   Screening for colon cancer 12/19/2019   Need for influenza vaccination 12/19/2019   Incontinence in female 08/24/2019   Quadriplegia and quadriparesis ([place]) 08/24/2019   Wheelchair dependence 08/24/2019   Cauda equina compression ([place]) 08/24/2019   Right ovarian cyst 02/04/2018   Endometrial cancer ([place]) 01/25/2018   Acute complete quadriplegia due to spinal cord lesion between fifth and seventh cervical vertebra ([place]) 09/11/1977          Physical Exam   Triage Vital Signs: ED Triage Vitals  Enc Vitals Group     BP 02/02/22 1447 104/80     Pulse Rate 02/02/22 1447 84     Resp 02/02/22 1447 18     Temp 02/02/22 1447 99.5 F (37.5 C)     Temp Source 02/02/22 1447 Oral     SpO2 02/02/22 1447 95 %     Weight --      Height --      Head Circumference --      Peak Flow --      Pain Score 02/02/22 1448 0     Pain Loc --      Pain Edu? --       Excl. in [place]? --     Most recent vital signs: Vitals:   02/02/22 1447  BP: 104/80  Pulse: 84  Resp: 18  Temp: 99.5 F (37.5 C)  SpO2: 95%    Physical Exam Vitals and nursing note reviewed.  Constitutional:      General: Awake and alert. No acute distress.    Appearance: Normal appearance. The patient is normal weight.  HENT:     Head: Normocephalic and atraumatic.     Mouth: Mucous membranes are moist.  Eyes:     General: PERRL. Normal EOMs        Right eye: No discharge.        Left eye: No discharge.     Conjunctiva/sclera: Conjunctivae normal.  Cardiovascular:     Rate and Rhythm: Normal rate and regular rhythm.     Pulses: Normal pulses.  Pulmonary:     Effort: Pulmonary effort is normal. No respiratory distress.     Breath sounds: Normal breath sounds.  Abdominal:     Abdomen is soft. There is no abdominal tenderness. No rebound or guarding. No distention. Musculoskeletal:  General: No swelling. Normal range of motion.     Cervical back: Normal range of motion and neck supple.  Pelvis stable.  Right hip is externally rotated and knee is flexed.  Able to passively range hip, though there is grinding with range of motion.  Normal range of motion of knee and ankle.  She has dopplerable pedal pulses.  Normal capillary refill over all 5 toes.  Compartments are soft and compressible throughout the entire lower extremity.  No sensation per baseline. Skin:    General: Skin is warm and dry.     Capillary Refill: Capillary refill takes less than 2 seconds.     Findings: No rash.  Neurological:     Mental Status: The patient is awake and alert.  Fully paralyzed in the waist down, limited ability of upper extremities     ED Results / Procedures / Treatments   Labs (all labs ordered are listed, but only abnormal results are displayed) Labs Reviewed - No data to display   EKG     RADIOLOGY I independently reviewed and interpreted imaging and agree with  radiologists findings.     PROCEDURES:  Critical Care performed:   Procedures   MEDICATIONS ORDERED IN ED: Medications - No data to display   IMPRESSION / MDM / [place] / ED COURSE  I reviewed the triage vital signs and the nursing notes.   Differential diagnosis includes, but is not limited to, hip fracture, hip dislocation, pelvic fracture, trochanteric bursitis, ligamental injury.  Patient is awake and alert, hemodynamically stable.  No palpable pedal pulses which patient reports is her baseline, though she has normal capillary refill and dopplerable pulses.  X-ray obtained demonstrates a subtrochanteric fracture.  This was discussed with Dr. Karel Jarvis, who subsequently came to the emergency department for formal consult.  He had a long discussion with the patient regarding treatment options in the setting of her paraplegia.  Given that she is nonweightbearing, Dr. Karel Jarvis feels that the best management of this is nonsurgical.  However, she needs to be a particularly careful for the next 4 weeks during transfers and to keep both legs adducted together during [facility] lift and transfers which patient understands and agrees with.  She was also instructed to check her capillary refill daily given that she has poor palpable pulses at baseline.  Dr. Karel Jarvis also recommends that patient follow-up in his office in 2 to 4 weeks and she agrees to do so.  Patient is comfortable with this plan.  I considered admission, however patient is nonweightbearing at baseline and prefers to go home.  Given that orthopedics is comfortable with her being discharged home as well this seems appropriate.  We discussed return precautions and the importance of outpatient follow-up.  Patient or stands and agrees with plan.  She was discharged in stable condition.   Patient's presentation is most consistent with acute complicated illness / injury requiring diagnostic workup.   Clinical Course as of 02/02/22  2103  Tue Feb 02, 2022  1615 Ortho paged [JP]  1800 Dr. Karel Jarvis came to the ER to evaluate the patient at the bedside recommends discharge home as this is a non-operative procedure given the patient is nonweightbearing [JP]    Clinical Course User Index [JP] Jesstin Studstill, Clarnce Flock, PA-C     FINAL CLINICAL IMPRESSION(S) / ED DIAGNOSES   Final diagnoses:  Closed displaced subtrochanteric fracture of right femur, initial encounter ([place])     Rx / DC Orders   ED  Discharge Orders     None        Note:  This document was prepared using Dragon voice recognition software and may include unintentional dictation errors.   Emeline Gins 02/02/22 2103    Merlyn Lot, MD 02/02/22 2153

## 2022-02-02 NOTE — Telephone Encounter (Signed)
Patient called stating that she thought her hip had popped out of place and she wanted an xray. She has been advised that she must be seen at Ortho or the ER and evaluated and they ca do the imaging at the same time. I tried to suggest [facility] but she said she would just go to the hospital since she had to call ambulance for transportation anyways.

## 2022-02-02 NOTE — ED Notes (Signed)
First Nurse Note  Presents vis EMS from home  Family heard a pop to right hip area yesterday

## 2022-02-05 ENCOUNTER — Telehealth: Payer: Self-pay

## 2022-02-05 ENCOUNTER — Encounter: Payer: Self-pay | Admitting: *Deleted

## 2022-02-05 ENCOUNTER — Telehealth: Payer: Self-pay | Admitting: *Deleted

## 2022-02-05 NOTE — Patient Outreach (Signed)
Care Coordination TOC Note Transition Care Management Follow-up Telephone Call Date of discharge and from where: ED Visit 02/02/22 [facility]; (R) femur fracture insetting of baseline quadriplegia; EMMI Red Alert notification: "No scheduled follow up;" and "No discharge instructions" How have you been since you were released from the hospital? "I am doing okay, but I have questions about how I am supposed to be positioning myself-- the ambulance drivers that brought me home from the ER didn't remember to give me my papers when I left the ER.  I do have the orthopedic doctor's phone number, I am going to call him now to schedule the appointment for next week.  My leg is still swollen and red and warm to touch, I have talked to my PCP office and am waiting for them to call me back because last night I felt like I might be running a slight fever.  I took tylenol and I am feeling better now, but I am not sure if I should be concerned about all of this-- the orthopedic surgeon made it very clear that he did not think that surgery would be beneficial for me since I have quadriplegia" Any questions or concerns? Yes- multiple specific questions about plan of care and what to expect- thoroughly reviewed post-ED visit discharge instructions which did not offer specific recommendations for positioning in setting of femur fracture with existing quadriplegia; patient verifies she is getting up out of bed and sitting her in wheelchair throughout day and verbalizes good understanding of importance of turning in effort to prevent pressure sores; confirms she has and is using the inter-knee cushion "all the time;" I encouraged patient to make prompt ED visit office visit appointment with orthopedic specialist- she declines needing assistance with this, and explains that she uses insurance transportation and needs a 3-day time frame for transportation to be arranged  Items Reviewed: Did the pt receive and understand the discharge  instructions provided? No  Medications obtained and verified? Yes  denies medication concerns; reports no new medications post-ED visit- verified through review of EHR Other? No  Any new allergies since your discharge? No  Dietary orders reviewed? No- deferred Do you have support at home? Yes  patient requires total care at home, which her mother provides; she also has daily aides that come to home and assist on weekdays  [place] and Equipment/Supplies: Were home health services ordered? no If so, what is the name of the agency? N/A  Has the agency set up a time to come to the patient's home? not applicable Were any new equipment or medical supplies ordered?  No What is the name of the medical supply agency? N/A Were you able to get the supplies/equipment? not applicable Do you have any questions related to the use of the equipment or supplies? No  Functional Questionnaire: (I = Independent and D = Dependent) ADLs: D  patient requires total care at home, which her mother provides; she also has daily aides that come to home and assist on weekdays  Bathing/Dressing- D  Meal Prep- D  Eating- D  Maintaining continence- D  Transferring/Ambulation- D  Managing Meds- D  Follow up appointments reviewed:  PCP Hospital f/u appt confirmed? No  Scheduled to see - on - @ [facility] f/u appt confirmed? No  Scheduled to see - on - @ - see narrative above- patient took phone number for orthopedic surgeon and confirmed she would call to schedule  Are transportation arrangements needed? No  If  their condition worsens, is the pt aware to call PCP or go to the Emergency Dept.? Yes Was the patient provided with contact information for the PCP's office or ED? Yes Was to pt encouraged to call back with questions or concerns? Yes  SDOH assessments and interventions completed:   Yes SDOH Interventions Today    Flowsheet Row Most Recent Value  SDOH Interventions   Food Insecurity  Interventions Intervention Not Indicated  Transportation Interventions Intervention Not Indicated  [reports uses insurance transportation- medical van]      Care Coordination Interventions:  Reviewed discharge instructions thoroughly; provided education/ discussed basic strategies for pressure ulcer prevention; provided education around reasons to seek emergent/ urgent care    Encounter Outcome:  Pt. Visit Completed    Oneta Rack, RN, BSN, CCRN Alumnus RN CM Care Coordination/ Transition of [place] Management [phone]: direct office

## 2022-02-05 NOTE — Telephone Encounter (Signed)
     Patient  visit on 11/28  at     Have you been able to follow up with your primary care physician? Yes   The patient was or was not able to obtain any needed medicine or equipment. Yes   Are there diet recommendations that you are having difficulty following? Na   Patient expresses understanding of discharge instructions and education provided has no other needs at this time.  Yes     Heidi Hess Pop Health Care Guide, [place], Care Management  [phone] [address], [place], [place] [address] Phone: [phone] Email: [email or web address]    

## 2022-02-05 NOTE — Telephone Encounter (Signed)
Patient states that she is having headaches and a low grade temp less than 100. She states that her urine is cloudy, smells and has sediment in it. She states that she felt better after finishing last abx but her urine never cleared up. She has been asked to take covid test due to fever and headache to rule it out. She also was seen recently in ED for broken femur. She would like to know what to do about her urine.

## 2022-02-08 ENCOUNTER — Encounter: Payer: Self-pay | Admitting: *Deleted

## 2022-02-08 ENCOUNTER — Telehealth: Payer: Self-pay | Admitting: *Deleted

## 2022-02-08 ENCOUNTER — Other Ambulatory Visit: Payer: Self-pay | Admitting: Internal Medicine

## 2022-02-08 NOTE — Patient Outreach (Signed)
Care Coordination   Initial Visit Note   02/08/2022 Name: Heidi Hess MRN: [id number] DOB: 1960/04/12  Heidi Hess is a 61 y.o. year old female who sees Heidi Athens, MD for primary care. I spoke with  Heidi Hess by phone today after recent [facility] call on 02/05/22.  What matters to the patients health and wellness today?  "I did okay over the weekend.... didn't have any new problems or concerns.  I called the orthopedic surgeon on Friday afternoon like you told me I should and I now have an appointment scheduled for this Friday on 02/12/22-- I plan to call my transportation provider tomorrow to schedule the ride because they won't do it until it is exactly 3 days in advance.  The surgeon told me it was okay to gently tilt onto my (R) side, like you had thought he might when we talked on Friday, so that is what I am doing.  I never did hear back from PCP after we talked on Friday, so I called them this morning because my urine still looks dark and cloudy and has a lot of sedimentation in it.... this morning, they told me that they had called in an antibiotic on Friday, but they never told they did-- so now I have made arrangements to have the antibiotic delivered to me today.  My nausea is better and I am not running a fever.  Thanks for calling me back to check in on me"  Interventions provided and scheduled with RN CM Care Coordinator for Monday 02/15/22, for follow up after scheduled orthopedic provider visit on 02/12/22; re-provided patient with my direct phone number should needs/ concerns arise prior to scheduled RN CM care Coordinator phone call on 02/15/22; chart copied to RN CM care coordinator as [place]             This Visit's Progress    Care Coordination Activities: scheduled with RN Care Coordinator   On track    Care Coordination Interventions: Evaluation of current treatment plan related to recent femur fracture in setting of baseline quadriplegia and  patient's adherence to plan as established by provider Advised patient to promptly make transportation arrangements using her established transportation service through insurance company: patient confirms she has contact information and will call tomorrow, as per guidelines for scheduling 3 days in advance- she denies need for assistance with this and reports she uses established transportation service regularly Provided education to patient re: signs/ symptoms UTI, complications around recent femur fracture, along with corresponding action plan for both Provided patient with MyChart educational materials related to UTI Reviewed scheduled/upcoming provider appointments including Friday 02/12/22- orthopedic surgical provider, as per recent post-ED visit  Discussed plans with patient for ongoing care management follow up and provided patient with direct contact information for care management team Advised patient to discuss specific questions around femur fracture management at home in setting of baseline quadriplegia without surgical recommendation with provider Assessed social determinant of health barriers Placed follow up call to patient post- recent TOC call with concerns; confirmed patient reports "doing better" and confirmed she was able to speak with orthopedic specialty team- reviewed recommendations provided by specialist for positioning- reports she was provided instructions that it is okay to lie on (R) side in gentle tilt for short periods of time; she confirms that her (R) leg swelling is neither better or worse post- TOC outreach on 02/05/22- "it looks the same as it did then; it is  still warm to touch, but no worse than it was on Friday;"  re- reviewed her outreach to PCP on Friday 02/05/22 re: possible UTI concerns; confirmed that PCP called in antibiotic but did not inform patient until today; confirmed patient has made arrangements to have antibiotic delivered today by outpatient pharmacy;  scheduled patient with RN CM Care Coordinator to follow up after scheduled orthopedic provider office visit on 02/15/22; re- provided my direct contact information should concerns arise to prior RN CM call on 02/15/22          SDOH assessments and interventions completed:  Yes  SDOH Interventions Today    Flowsheet Row Most Recent Value  SDOH Interventions   Transportation Interventions Intervention Not Indicated  [uses insurance transportation,  verifies has contact information for transportation and understands how to use- reports uses regularly]       Care Coordination Interventions:  Yes, provided   Follow up plan: Follow up call scheduled for Monday 02/15/22 at 1:30 pm with RN CM Care Coordinator    Encounter Outcome:  Pt. Visit Completed   Heidi Rack, RN, BSN, CCRN Alumnus RN CM Care Coordination/ [facility] [place] [facility] [phone]: direct office

## 2022-02-08 NOTE — Telephone Encounter (Signed)
Please see other message.

## 2022-02-08 NOTE — Patient Instructions (Signed)
Visit Information  Thank you for taking time to visit with me today. Please don't hesitate to contact me if I can be of assistance to you.   Following are the goals we discussed today:   Goals Addressed             This Visit's Progress    Care Coordination Activities: scheduled with RN Care Coordinator   On track    Care Coordination Interventions: Evaluation of current treatment plan related to recent femur fracture in setting of baseline quadriplegia and patient's adherence to plan as established by provider Advised patient to promptly make transportation arrangements using her established transportation service through insurance company: patient confirms she has contact information and will call tomorrow, as per guidelines for scheduling 3 days in advance- she denies need for assistance with this and reports she uses established transportation service regularly Provided education to patient re: signs/ symptoms UTI, complications around recent femur fracture, along with corresponding action plan for both Provided patient with MyChart educational materials related to UTI Reviewed scheduled/upcoming provider appointments including Friday 02/12/22- orthopedic surgical provider, as per recent post-ED visit  Discussed plans with patient for ongoing care management follow up and provided patient with direct contact information for care management team Advised patient to discuss specific questions around femur fracture management at home in setting of baseline quadriplegia without surgical recommendation with provider Assessed social determinant of health barriers Placed follow up call to patient post- recent TOC call with concerns; confirmed patient reports "doing better" and confirmed she was able to speak with orthopedic specialty team- reviewed recommendations provided by specialist for positioning- reports she was provided instructions that it is okay to lie on (R) side in gentle tilt for  short periods of time; she confirms that her (R) leg swelling is neither better or worse post- TOC outreach on 02/05/22- "it looks the same as it did then; it is still warm to touch, but no worse than it was on Friday;"  re- reviewed her outreach to PCP on Friday 02/05/22 re: possible UTI concerns; confirmed that PCP called in antibiotic but did not inform patient until today; confirmed patient has made arrangements to have antibiotic delivered today by outpatient pharmacy; scheduled patient with RN CM Care Coordinator to follow up after scheduled orthopedic provider office visit on 02/15/22; re- provided my direct contact information should concerns arise to prior RN CM call on 02/15/22          Your telephone call appointment with the Nurse Care Coordinator is by telephone on Monday 02/15/22 at 1:30 pm  Please call the care guide team at [phone] if you need to cancel or reschedule your appointment.   If you are experiencing a Mental Health or [place] or need someone to talk to, please  call the Suicide and Crisis Lifeline: 988 call the [place] National Suicide Prevention Lifeline: (564)197-3577 or TTY: (937)622-9143 TTY 854-009-2658) to talk to a trained counselor call 1-800-273-TALK (toll free, 24 hour hotline) go to [facility] [address], [place] [phone]) call the [place]: [phone] call 911   Patient verbalizes understanding of instructions and care plan provided today and agrees to view in [place]. Active MyChart status and patient understanding of how to access instructions and care plan via MyChart confirmed with patient.     Telephone follow up appointment with care management team member scheduled for: Monday 02/15/22 at 1:30 pm  Oneta Rack, RN, BSN, CCRN Alumnus  RN CM Care Coordination/ [facility] [place] Management [phone]: direct office

## 2022-02-09 ENCOUNTER — Encounter: Payer: Self-pay | Admitting: Internal Medicine

## 2022-02-09 ENCOUNTER — Other Ambulatory Visit: Payer: Self-pay | Admitting: Internal Medicine

## 2022-02-12 DIAGNOSIS — M79604 Pain in right leg: Secondary | ICD-10-CM | POA: Diagnosis not present

## 2022-02-14 DIAGNOSIS — G834 Cauda equina syndrome: Secondary | ICD-10-CM | POA: Diagnosis not present

## 2022-02-14 DIAGNOSIS — Z466 Encounter for fitting and adjustment of urinary device: Secondary | ICD-10-CM | POA: Diagnosis not present

## 2022-02-14 DIAGNOSIS — Z9181 History of falling: Secondary | ICD-10-CM | POA: Diagnosis not present

## 2022-02-14 DIAGNOSIS — Z792 Long term (current) use of antibiotics: Secondary | ICD-10-CM | POA: Diagnosis not present

## 2022-02-14 DIAGNOSIS — G8250 Quadriplegia, unspecified: Secondary | ICD-10-CM | POA: Diagnosis not present

## 2022-02-14 DIAGNOSIS — Z7401 Bed confinement status: Secondary | ICD-10-CM | POA: Diagnosis not present

## 2022-02-15 ENCOUNTER — Ambulatory Visit: Payer: Self-pay

## 2022-02-15 NOTE — Patient Outreach (Signed)
  Care Coordination   Initial Visit Note   02/15/2022 Name: Heidi Hess MRN: [id number] DOB: April 03, 1960  Heidi Hess is a 61 y.o. year old female who sees [name], MD for primary care. I spoke with  [name] by phone today.  What matters to the patients health and wellness today?  Care coordination assistance/ management    Goals Addressed             This Visit's Progress    Assistance with care coordination activities/ management related to healt conditions/ needs.       Care Coordination Interventions: Evaluation of current treatment plan related to recent femur fracture in setting of baseline C-6 quadriplegia and patient's adherence to plan as established by provider.  Patient reports she is doing well. She reports having follow up with orthopedic surgeon and had no change in treatment plan.  States overall leg is healing without complication. She states she still has some mild swelling.  Patient reports having nausea when getting ready to eat( sight/ smell causes the nausea).  She states this has been going on for a while.   Patient states she requested new hospital bed from primary provider office. She reports talking to [facility] about bed status. She states they are waiting for additional information from provider office.  Discussed patients transportation status.  Patient states she has transportation through her medical insurance plan. She states she has a few more transport visits for this year then her 25 transport visits with her health plan will start back over in January 2024.  Discussed patients status related to recent UTI:  Patient states she completed her antibiotic. She states her urine still has some sediment.  She states the urine in her foley tubing is clear but amber in color in the foley bag.   Patient confirms she has a Marine scientist with [facility] home [facility] that does foley catheter changes for her every 3 weeks. She states the nurse was unable to take  in a urine specimen due to not having a standing order.  Reviewed medications and discussed importance of compliance. Patient states she attempted to get her baclophen and oxybutin refilled with assistance from primary care provider office. She states the oxybutin was renewed but Baclophen was denied for refill.   Reviewed scheduled/upcoming provider appointments:  Patient advised to call primary care office to schedule follow up visit to discuss medication refill, urinary concerns, and hospital bed request.   Discussed plans with patient for ongoing care management follow up:  Next telephone outreach visit with RNCM is 03/09/22.             SDOH assessments and interventions completed:  No     Care Coordination Interventions:  Yes, provided   Follow up plan: Follow up call scheduled for 03/09/22    Encounter Outcome:  Pt. Visit Completed   [name] RN,BSN,CCM [place] [phone] direct line

## 2022-02-17 DIAGNOSIS — Z87440 Personal history of urinary (tract) infections: Secondary | ICD-10-CM | POA: Diagnosis not present

## 2022-02-17 DIAGNOSIS — G8250 Quadriplegia, unspecified: Secondary | ICD-10-CM | POA: Diagnosis not present

## 2022-02-17 DIAGNOSIS — R339 Retention of urine, unspecified: Secondary | ICD-10-CM | POA: Diagnosis not present

## 2022-02-23 ENCOUNTER — Ambulatory Visit (INDEPENDENT_AMBULATORY_CARE_PROVIDER_SITE_OTHER): Payer: Medicare Other | Admitting: Internal Medicine

## 2022-02-23 ENCOUNTER — Encounter: Payer: Self-pay | Admitting: Internal Medicine

## 2022-02-23 ENCOUNTER — Telehealth: Payer: Self-pay

## 2022-02-23 VITALS — BP 108/72 | HR 79 | Wt 163.0 lb

## 2022-02-23 DIAGNOSIS — G8250 Quadriplegia, unspecified: Secondary | ICD-10-CM

## 2022-02-23 DIAGNOSIS — S7221XG Displaced subtrochanteric fracture of right femur, subsequent encounter for closed fracture with delayed healing: Secondary | ICD-10-CM | POA: Diagnosis not present

## 2022-02-23 DIAGNOSIS — Z993 Dependence on wheelchair: Secondary | ICD-10-CM | POA: Diagnosis not present

## 2022-02-23 MED ORDER — NITROFURANTOIN MACROCRYSTAL 50 MG PO CAPS: 50.0000 mg | ORAL_CAPSULE | Freq: Two times a day (BID) | ORAL | 1 refills | Status: DC

## 2022-02-23 MED ORDER — BACLOFEN 10 MG PO TABS: 10.0000 mg | ORAL_TABLET | Freq: Three times a day (TID) | ORAL | 3 refills | Status: DC

## 2022-02-23 MED ORDER — OXYBUTYNIN CHLORIDE ER 15 MG PO TB24: 15.0000 mg | ORAL_TABLET | Freq: Every day | ORAL | 3 refills | Status: DC

## 2022-02-23 NOTE — Progress Notes (Signed)
Established Patient Office Visit  Subjective:  Patient ID: Heidi Hess, female    DOB: 02/21/1961  Age: 61 y.o. MRN: [id number]  CC:  Chief Complaint  Patient presents with   Follow-up    Fu with broken leg, and discuss medications. Urine order verbally given to [facility] for clean catch at next catheter change.     HPI  Heidi Hess presents for history of broken right hip being treated conservatively because of quadriplegia.  She denies any chest pain pain in the right hip or swelling.  Past Medical History:  Diagnosis Date   Allergy    Cancer ([place])    Quadriplegia ([place])    C-6    Past Surgical History:  Procedure Laterality Date   ABDOMINAL HYSTERECTOMY     ROBOTIC ASSISTED SUPRACERVICAL HYSTERECTOMY WITH BILATERAL SALPINGO OOPHERECTOMY Bilateral 03/30/2018   Uterus, cervix, bilateral fallopian tubes and ovaries, hysterectomy and bilateral salpingo-oophorectomy    Family History  Problem Relation Age of Onset   Breast cancer Paternal Aunt    Breast cancer Cousin        maternal    Social History   Socioeconomic History   Marital status: Single    Spouse name: Not on file   Number of children: Not on file   Years of education: 12   Highest education level: 12th grade  Occupational History   Not on file  Tobacco Use   Smoking status: Former    Types: Cigarettes    Quit date: 2010    Years since quitting: 13.9    Passive exposure: Past   Smokeless tobacco: Never  Vaping Use   Vaping Use: Never used  Substance and Sexual Activity   Alcohol use: Not Currently   Drug use: Never   Sexual activity: Not Currently  Other Topics Concern   Not on file  Social History Narrative   Not on file   Social Determinants of Health   Financial Resource Strain: Low Risk  (12/02/2020)   Overall Financial Resource Strain (CARDIA)    Difficulty of Paying Living Expenses: Not hard at all  Food Insecurity: No Food Insecurity (02/05/2022)   Hunger Vital Sign     Worried About Running Out of Food in the Last Year: Never true    Ran Out of Food in the Last Year: Never true  Transportation Needs: No Transportation Needs (02/08/2022)   PRAPARE - Hydrologist (Medical): No    Lack of Transportation (Non-Medical): No  Physical Activity: Inactive (12/02/2020)   Exercise Vital Sign    Days of Exercise per Week: 0 days    Minutes of Exercise per Session: 0 min  Stress: No Stress Concern Present (12/02/2020)   [place]    Feeling of Stress : Only a little  Social Connections: Unknown (12/02/2020)   Social Connection and Isolation Panel [NHANES]    Frequency of Communication with Friends and Family: More than three times a week    Frequency of Social Gatherings with Friends and Family: More than three times a week    Attends Religious Services: More than 4 times per year    Active Member of [employer] or Organizations: No    Attends Archivist Meetings: Never    Marital Status: Not on file  Intimate Partner Violence: Not At Risk (12/02/2020)   Humiliation, Afraid, Rape, and Kick questionnaire    Fear of Current or Ex-Partner: No  Emotionally Abused: No    Physically Abused: No    Sexually Abused: No     Current Outpatient Medications:    Acetaminophen 325 MG CAPS, , Disp: , Rfl:    Ascorbic Acid (VITAMIN C) 500 MG CHEW, Chew 500 mg by mouth daily., Disp: , Rfl:    bisacodyl (DULCOLAX) 10 MG suppository, Place 10 mg rectally daily., Disp: , Rfl:    Calcium Carb-Cholecalciferol (OYSTER SHELL CALCIUM) 500-400 MG-UNIT TABS, , Disp: , Rfl:    Control Gel Formula Dressing (DUODERM CGF EXTRA THIN) MISC, Apply 1 each topically once a week., Disp: 4 each, Rfl: 0   Docusate Sodium (DSS) 250 MG CAPS, , Disp: , Rfl:    fluconazole (DIFLUCAN) 150 MG tablet, Take 1 tablet (150 mg total) by mouth daily., Disp: 30 tablet, Rfl: 3   loratadine (CLARITIN) 10 MG tablet,  Take 10 mg by mouth daily., Disp: , Rfl:    Melatonin 1 MG CAPS, Take 1 capsule by mouth at bedtime as needed., Disp: , Rfl:    Multiple Vitamins-Minerals (CENTRUM ADULTS) TABS, , Disp: , Rfl:    nystatin ointment (MYCOSTATIN), Apply 30 g topically daily., Disp: , Rfl:    Olopatadine HCl 0.2 % SOLN, Place 1 drop into both eyes daily at 2 PM., Disp: 2.5 mL, Rfl: 1   Simethicone 180 MG CAPS, Take 180 mg by mouth daily., Disp: , Rfl:    Vitamin D, Cholecalciferol, 25 MCG (1000 UT) TABS, Take 5,000 Units by mouth daily at 2 PM., Disp: , Rfl:    baclofen (LIORESAL) 10 MG tablet, Take 1 tablet (10 mg total) by mouth 3 (three) times daily., Disp: 90 tablet, Rfl: 3   nitrofurantoin (MACRODANTIN) 50 MG capsule, Take 1 capsule (50 mg total) by mouth 2 (two) times daily., Disp: 180 capsule, Rfl: 1   oxybutynin (DITROPAN XL) 15 MG 24 hr tablet, Take 1 tablet (15 mg total) by mouth daily., Disp: 90 tablet, Rfl: 3   Allergies  Allergen Reactions   Latex Rash   Nickel Dermatitis, Itching and Rash   Sulfa Antibiotics Itching, Nausea Only and Rash    ROS Review of Systems  HENT:  Negative for congestion.   Respiratory:  Negative for cough.   Cardiovascular:  Negative for chest pain.  Gastrointestinal:  Negative for abdominal distention.      Objective:    Physical Exam Musculoskeletal:     Comments: There is a fracture of the right hip at the trochanter level being treated conservatively by the orthopedic surgeon     BP 108/72   Pulse 79   Wt 163 lb (73.9 kg) Comment: pt reported  SpO2 97%   BMI 26.31 kg/m  Wt Readings from Last 3 Encounters:  02/23/22 163 lb (73.9 kg)  06/23/20 170 lb (77.1 kg)  12/19/19 170 lb (77.1 kg)     Health Maintenance Due  Topic Date Due   Lung Cancer Screening  01/13/2009   Medicare Annual Wellness (AWV)  12/27/2019   COVID-19 Vaccine (6 - 2023-24 season) 02/04/2022    There are no preventive care reminders to display for this patient.  Lab Results   Component Value Date   TSH 1.86 12/22/2021   Lab Results  Component Value Date   WBC 6.2 12/22/2021   HGB 14.0 12/22/2021   HCT 41.5 12/22/2021   MCV 89.8 12/22/2021   PLT 213 12/22/2021   Lab Results  Component Value Date   NA 136 12/22/2021   K 4.1 12/22/2021  CO2 24 12/22/2021   GLUCOSE 82 12/22/2021   BUN 11 12/22/2021   CREATININE 0.47 (L) 12/22/2021   BILITOT 0.5 12/22/2021   AST 13 12/22/2021   ALT 9 12/22/2021   PROT 6.8 12/22/2021   CALCIUM 8.9 12/22/2021   EGFR 108 12/22/2021   Lab Results  Component Value Date   CHOL 186 12/22/2021   Lab Results  Component Value Date   HDL 34 (L) 12/22/2021   Lab Results  Component Value Date   LDLCALC 127 (H) 12/22/2021   Lab Results  Component Value Date   TRIG 137 12/22/2021   Lab Results  Component Value Date   CHOLHDL 5.5 (H) 12/22/2021   No results found for: "HGBA1C"    Assessment & Plan:   Problem List Items Addressed This Visit       Nervous and Auditory   Quadriplegia and quadriparesis ([place]) - Primary    No change in the general condition patient does not have any pain has a history of fracture in the right hip being treated by orthopedic surgeon        Musculoskeletal and Integument   Closed displaced subtrochanteric fracture of right femur ([place])     Other   Wheelchair dependence    Stable       Meds ordered this encounter  Medications   baclofen (LIORESAL) 10 MG tablet    Sig: Take 1 tablet (10 mg total) by mouth 3 (three) times daily.    Dispense:  90 tablet    Refill:  3   nitrofurantoin (MACRODANTIN) 50 MG capsule    Sig: Take 1 capsule (50 mg total) by mouth 2 (two) times daily.    Dispense:  180 capsule    Refill:  1   oxybutynin (DITROPAN XL) 15 MG 24 hr tablet    Sig: Take 1 tablet (15 mg total) by mouth daily.    Dispense:  90 tablet    Refill:  3    Follow-up: No follow-ups on file.    [name], MD

## 2022-02-23 NOTE — Telephone Encounter (Signed)
Spoke with [facility] about bed and gel overlay. They state they never received the narrative about the bed so the order was cancelled. I have refaxed the narrative. Patient is aware.

## 2022-02-23 NOTE — Assessment & Plan Note (Signed)
No change in the general condition patient does not have any pain has a history of fracture in the right hip being treated by orthopedic surgeon

## 2022-02-23 NOTE — Assessment & Plan Note (Signed)
Stable

## 2022-02-24 DIAGNOSIS — G8250 Quadriplegia, unspecified: Secondary | ICD-10-CM | POA: Diagnosis not present

## 2022-02-26 DIAGNOSIS — Z7401 Bed confinement status: Secondary | ICD-10-CM | POA: Diagnosis not present

## 2022-02-26 DIAGNOSIS — R339 Retention of urine, unspecified: Secondary | ICD-10-CM | POA: Diagnosis not present

## 2022-02-26 DIAGNOSIS — Z9181 History of falling: Secondary | ICD-10-CM | POA: Diagnosis not present

## 2022-02-26 DIAGNOSIS — G834 Cauda equina syndrome: Secondary | ICD-10-CM | POA: Diagnosis not present

## 2022-02-26 DIAGNOSIS — G8250 Quadriplegia, unspecified: Secondary | ICD-10-CM | POA: Diagnosis not present

## 2022-02-26 DIAGNOSIS — Z466 Encounter for fitting and adjustment of urinary device: Secondary | ICD-10-CM | POA: Diagnosis not present

## 2022-02-26 DIAGNOSIS — Z792 Long term (current) use of antibiotics: Secondary | ICD-10-CM | POA: Diagnosis not present

## 2022-02-26 DIAGNOSIS — Z87440 Personal history of urinary (tract) infections: Secondary | ICD-10-CM | POA: Diagnosis not present

## 2022-03-05 DIAGNOSIS — G834 Cauda equina syndrome: Secondary | ICD-10-CM | POA: Diagnosis not present

## 2022-03-05 DIAGNOSIS — Z466 Encounter for fitting and adjustment of urinary device: Secondary | ICD-10-CM | POA: Diagnosis not present

## 2022-03-05 DIAGNOSIS — Z792 Long term (current) use of antibiotics: Secondary | ICD-10-CM | POA: Diagnosis not present

## 2022-03-05 DIAGNOSIS — G8250 Quadriplegia, unspecified: Secondary | ICD-10-CM | POA: Diagnosis not present

## 2022-03-05 DIAGNOSIS — Z7401 Bed confinement status: Secondary | ICD-10-CM | POA: Diagnosis not present

## 2022-03-05 DIAGNOSIS — Z9181 History of falling: Secondary | ICD-10-CM | POA: Diagnosis not present

## 2022-03-09 ENCOUNTER — Ambulatory Visit: Payer: Self-pay

## 2022-03-09 NOTE — Patient Outreach (Signed)
  Care Coordination   Follow Up Visit Note   03/09/2022 Name: Heidi Hess MRN: [id number] DOB: 1960/10/29  Heidi Hess is a 62 y.o. year old female who sees [name], MD for primary care. I spoke with  [name] by phone today.  What matters to the patients health and wellness today?  Ongoing care coordination follow up     Goals Addressed             This Visit's Progress    Assistance with care coordination activities/ management related to healt conditions/ needs.       Care Coordination Interventions: Evaluation of current treatment plan related to recent femur fracture in setting of baseline C-6 quadriplegia and patient's adherence to plan as established by provider. Patient reports leg is healing but she continues to have swelling on occasion. She states she is scheduled to follow up with the orthopedic surgeon on 03/12/22.  Patient states she plans to change primary care providers. She reports having follow up visit with current provider on 02/24/23. Patient states she is waiting for notification regarding her urine specimen results.  Patient advised to contact RNCM if unable to secure transportation for ortho appointment on 03/12/22.  Patient states she attempted to schedule her transportation service for provider appointment appointment on 03/12/22. She states she is waiting for a return call from them. Medications reviewed and compliance discussed:  Patient states she received a written prescription for all of her medications from her primary care provider.  Discussed plans with patient for ongoing care management follow up:  Next telephone outreach visit with RNCM is 04/09/22            SDOH assessments and interventions completed:  No     Care Coordination Interventions:  Yes, provided   Follow up plan: Follow up call scheduled for 04/09/22    Encounter Outcome:  Pt. Visit Completed   [name] RN,BSN,CCM [place] [phone] direct  line

## 2022-03-10 ENCOUNTER — Encounter: Payer: Self-pay | Admitting: Internal Medicine

## 2022-03-12 DIAGNOSIS — S7221XA Displaced subtrochanteric fracture of right femur, initial encounter for closed fracture: Secondary | ICD-10-CM | POA: Diagnosis not present

## 2022-03-15 DIAGNOSIS — Z87440 Personal history of urinary (tract) infections: Secondary | ICD-10-CM | POA: Diagnosis not present

## 2022-03-15 DIAGNOSIS — R339 Retention of urine, unspecified: Secondary | ICD-10-CM | POA: Diagnosis not present

## 2022-03-15 DIAGNOSIS — G8250 Quadriplegia, unspecified: Secondary | ICD-10-CM | POA: Diagnosis not present

## 2022-03-16 ENCOUNTER — Encounter: Payer: Self-pay | Admitting: Internal Medicine

## 2022-03-24 DIAGNOSIS — R339 Retention of urine, unspecified: Secondary | ICD-10-CM | POA: Diagnosis not present

## 2022-03-24 DIAGNOSIS — G8250 Quadriplegia, unspecified: Secondary | ICD-10-CM | POA: Diagnosis not present

## 2022-03-24 DIAGNOSIS — Z87440 Personal history of urinary (tract) infections: Secondary | ICD-10-CM | POA: Diagnosis not present

## 2022-03-25 DIAGNOSIS — G8250 Quadriplegia, unspecified: Secondary | ICD-10-CM | POA: Diagnosis not present

## 2022-03-25 DIAGNOSIS — Z792 Long term (current) use of antibiotics: Secondary | ICD-10-CM | POA: Diagnosis not present

## 2022-03-25 DIAGNOSIS — Z9181 History of falling: Secondary | ICD-10-CM | POA: Diagnosis not present

## 2022-03-25 DIAGNOSIS — G834 Cauda equina syndrome: Secondary | ICD-10-CM | POA: Diagnosis not present

## 2022-03-25 DIAGNOSIS — Z7401 Bed confinement status: Secondary | ICD-10-CM | POA: Diagnosis not present

## 2022-03-25 DIAGNOSIS — Z466 Encounter for fitting and adjustment of urinary device: Secondary | ICD-10-CM | POA: Diagnosis not present

## 2022-03-27 DIAGNOSIS — G8250 Quadriplegia, unspecified: Secondary | ICD-10-CM | POA: Diagnosis not present

## 2022-04-09 ENCOUNTER — Ambulatory Visit: Payer: Self-pay

## 2022-04-09 NOTE — Patient Outreach (Signed)
  Care Coordination   Follow Up Visit Note   04/09/2022 Name: Heidi Hess MRN: [id number] DOB: [birth year]  Heidi Hess is a 62 y.o. year old female who sees Heidi Athens, MD for primary care. I spoke with  Heidi Hess by phone today.  What matters to the patients health and wellness today?  Insurance plan transportation service issues   Goals Addressed             This Visit's Progress    Assistance with care coordination activities/ management related to healt conditions/ needs.       Care Coordination Interventions: Evaluation of current treatment plan related to recent femur fracture in setting of baseline C-6 quadriplegia and patient's adherence to plan as established by provider. Patient reports leg is healing.  She reports having follow up with the orthopedic surgeon.  She states per her surgeon the connective tissue is growing back and she is doing very well.   Patient reports urinary issues have resolved and she is now taking cranberry pills as preventative.  Assessed patients transportation concern:  Patient states she is having issues with transportation service through her insurance plan. She states the service picks her up and takes her to her appointment but she has to wait approximately 3 hours for them to pick her up from the provider office to home.   Patient states this has happened for primary care visits as well as specialist visits.  She states she has notified [place] care several times of this issues.  Medications reviewed and compliance discussed:   Reviewed scheduled / upcoming appointment Message sent to [facility] Director of care management, Heidi Hess regarding patients issues with transportation.  Discussed plans with patient for ongoing care management follow up:  Next telephone outreach visit with RNCM is 06/03/22            SDOH assessments and interventions completed:  No     Care Coordination Interventions:  Yes, provided   Interventions Today    Flowsheet Row Most Recent Value  Chronic Disease Discussed/Reviewed   Chronic disease discussed/reviewed during today's visit Other  [status post femur fracture]  General Interventions   General Interventions Discussed/Reviewed Oncologist with insurance transportation service.]          Follow up plan: Follow up call scheduled for 06/03/22    Encounter Outcome:  Pt. Visit Completed

## 2022-04-14 DIAGNOSIS — G8250 Quadriplegia, unspecified: Secondary | ICD-10-CM | POA: Diagnosis not present

## 2022-04-14 DIAGNOSIS — G834 Cauda equina syndrome: Secondary | ICD-10-CM | POA: Diagnosis not present

## 2022-04-14 DIAGNOSIS — Z9181 History of falling: Secondary | ICD-10-CM | POA: Diagnosis not present

## 2022-04-14 DIAGNOSIS — Z7401 Bed confinement status: Secondary | ICD-10-CM | POA: Diagnosis not present

## 2022-04-14 DIAGNOSIS — Z466 Encounter for fitting and adjustment of urinary device: Secondary | ICD-10-CM | POA: Diagnosis not present

## 2022-04-14 DIAGNOSIS — Z792 Long term (current) use of antibiotics: Secondary | ICD-10-CM | POA: Diagnosis not present

## 2022-04-20 ENCOUNTER — Ambulatory Visit: Payer: Medicare Other | Admitting: Internal Medicine

## 2022-04-20 DIAGNOSIS — S7221XA Displaced subtrochanteric fracture of right femur, initial encounter for closed fracture: Secondary | ICD-10-CM | POA: Diagnosis not present

## 2022-04-23 DIAGNOSIS — Z792 Long term (current) use of antibiotics: Secondary | ICD-10-CM | POA: Diagnosis not present

## 2022-04-23 DIAGNOSIS — Z466 Encounter for fitting and adjustment of urinary device: Secondary | ICD-10-CM | POA: Diagnosis not present

## 2022-04-23 DIAGNOSIS — G834 Cauda equina syndrome: Secondary | ICD-10-CM | POA: Diagnosis not present

## 2022-04-23 DIAGNOSIS — G8250 Quadriplegia, unspecified: Secondary | ICD-10-CM | POA: Diagnosis not present

## 2022-04-23 DIAGNOSIS — Z9181 History of falling: Secondary | ICD-10-CM | POA: Diagnosis not present

## 2022-04-23 DIAGNOSIS — Z7401 Bed confinement status: Secondary | ICD-10-CM | POA: Diagnosis not present

## 2022-04-24 DIAGNOSIS — Z87440 Personal history of urinary (tract) infections: Secondary | ICD-10-CM | POA: Diagnosis not present

## 2022-04-24 DIAGNOSIS — R339 Retention of urine, unspecified: Secondary | ICD-10-CM | POA: Diagnosis not present

## 2022-04-24 DIAGNOSIS — G8250 Quadriplegia, unspecified: Secondary | ICD-10-CM | POA: Diagnosis not present

## 2022-04-27 DIAGNOSIS — Z7401 Bed confinement status: Secondary | ICD-10-CM | POA: Diagnosis not present

## 2022-04-27 DIAGNOSIS — Z9181 History of falling: Secondary | ICD-10-CM | POA: Diagnosis not present

## 2022-04-27 DIAGNOSIS — G834 Cauda equina syndrome: Secondary | ICD-10-CM | POA: Diagnosis not present

## 2022-04-27 DIAGNOSIS — Z466 Encounter for fitting and adjustment of urinary device: Secondary | ICD-10-CM | POA: Diagnosis not present

## 2022-04-27 DIAGNOSIS — G8250 Quadriplegia, unspecified: Secondary | ICD-10-CM | POA: Diagnosis not present

## 2022-04-27 DIAGNOSIS — Z792 Long term (current) use of antibiotics: Secondary | ICD-10-CM | POA: Diagnosis not present

## 2022-05-03 DIAGNOSIS — Z466 Encounter for fitting and adjustment of urinary device: Secondary | ICD-10-CM | POA: Diagnosis not present

## 2022-05-03 DIAGNOSIS — Z7401 Bed confinement status: Secondary | ICD-10-CM | POA: Diagnosis not present

## 2022-05-03 DIAGNOSIS — N390 Urinary tract infection, site not specified: Secondary | ICD-10-CM | POA: Diagnosis not present

## 2022-05-03 DIAGNOSIS — G834 Cauda equina syndrome: Secondary | ICD-10-CM | POA: Diagnosis not present

## 2022-05-03 DIAGNOSIS — Z792 Long term (current) use of antibiotics: Secondary | ICD-10-CM | POA: Diagnosis not present

## 2022-05-03 DIAGNOSIS — Z9181 History of falling: Secondary | ICD-10-CM | POA: Diagnosis not present

## 2022-05-03 DIAGNOSIS — G8250 Quadriplegia, unspecified: Secondary | ICD-10-CM | POA: Diagnosis not present

## 2022-05-12 DIAGNOSIS — G8250 Quadriplegia, unspecified: Secondary | ICD-10-CM | POA: Diagnosis not present

## 2022-05-12 DIAGNOSIS — Z87440 Personal history of urinary (tract) infections: Secondary | ICD-10-CM | POA: Diagnosis not present

## 2022-05-12 DIAGNOSIS — R339 Retention of urine, unspecified: Secondary | ICD-10-CM | POA: Diagnosis not present

## 2022-05-20 DIAGNOSIS — Z9181 History of falling: Secondary | ICD-10-CM | POA: Diagnosis not present

## 2022-05-20 DIAGNOSIS — N390 Urinary tract infection, site not specified: Secondary | ICD-10-CM | POA: Diagnosis not present

## 2022-05-20 DIAGNOSIS — Z7401 Bed confinement status: Secondary | ICD-10-CM | POA: Diagnosis not present

## 2022-05-20 DIAGNOSIS — G834 Cauda equina syndrome: Secondary | ICD-10-CM | POA: Diagnosis not present

## 2022-05-20 DIAGNOSIS — Z466 Encounter for fitting and adjustment of urinary device: Secondary | ICD-10-CM | POA: Diagnosis not present

## 2022-05-20 DIAGNOSIS — G8250 Quadriplegia, unspecified: Secondary | ICD-10-CM | POA: Diagnosis not present

## 2022-05-20 DIAGNOSIS — Z792 Long term (current) use of antibiotics: Secondary | ICD-10-CM | POA: Diagnosis not present

## 2022-05-25 DIAGNOSIS — R339 Retention of urine, unspecified: Secondary | ICD-10-CM | POA: Diagnosis not present

## 2022-05-25 DIAGNOSIS — G8250 Quadriplegia, unspecified: Secondary | ICD-10-CM | POA: Diagnosis not present

## 2022-05-25 DIAGNOSIS — Z87440 Personal history of urinary (tract) infections: Secondary | ICD-10-CM | POA: Diagnosis not present

## 2022-05-26 DIAGNOSIS — G8250 Quadriplegia, unspecified: Secondary | ICD-10-CM | POA: Diagnosis not present

## 2022-06-03 ENCOUNTER — Ambulatory Visit: Payer: Self-pay

## 2022-06-03 NOTE — Patient Outreach (Signed)
  Care Coordination   Follow Up Visit Note   06/03/2022 Name: Heidi Hess MRN: [id number] DOB: [birth year]  Heidi Hess is a 62 y.o. year old female who sees Heidi Athens, MD for primary care. I spoke with  [name] by phone today.  What matters to the patients health and wellness today?   Patient states she has changed primary care providers and her new provider appointment is on 07/13/22.  Patient inquired if previous provider orders will cover her for catheter change in April 2024. Call to [facility] and message left with [name] requesting provider order coverage dates catheter care and re certification date.  Call received from [name] with [name] stating patients orders cover her for catheter care until 06/30/22.  She states [name] will contact new primary care provider at that time and request new orders.  [name] states patients re certification date is 06/30/22 which will be for 60 days.      Goals Addressed             This Visit's Progress    Assistance with care coordination activities/ management related to healt conditions/ needs.       Interventions Today    Flowsheet Row Most Recent Value  Chronic Disease   Chronic disease during today's visit Other  [Quadrapeligia, urinary incontinence indwelling catheter]  General Interventions   General Interventions Discussed/Reviewed General Interventions Reviewed, Doctor Visits  [Call to [facility] to verify provider order to cover catheter change for April [id number] and confirm recertification date.  Message left with [name] with [name]]  Doctor Visits Discussed/Reviewed Doctor Visits Reviewed  [Discussed recent orthopedic provider follow up visit. Discussed ongoing care coordination follow up]  Pharmacy Interventions   Pharmacy Dicussed/Reviewed Pharmacy Topics Reviewed  [medications reviewed / discussed.]                 SDOH assessments and interventions completed:   No     Care Coordination Interventions:  Yes, provided   Follow up plan: Follow up call scheduled for 07/19/22    Encounter Outcome:  Pt. Visit Completed   [name] RN,BSN,CCM [place] [phone] direct line

## 2022-07-13 ENCOUNTER — Ambulatory Visit: Payer: 59 | Admitting: Family Medicine

## 2022-07-13 ENCOUNTER — Telehealth: Payer: Self-pay

## 2022-07-13 NOTE — Telephone Encounter (Signed)
I am unable to forward to PCP as he is no longer [facility]?  Sent to wrong office  Copied from CRM [id number]. Topic: General - Other >> Jul 13, 2022  1:22 PM Carrielelia G wrote: Reason for CRM: please call regarding paperwork for [facility]. I need a document signed for care services (foley catheter changing)  Please call and advise patient

## 2022-07-13 NOTE — Progress Notes (Deleted)
New patient visit   Patient: Heidi Hess   DOB: [birth year]   62 y.o. Female  MRN: [id number] Visit Date: 07/13/2022  Today's healthcare provider: Sherlyn Hay, DO   No chief complaint on file.  Subjective    Heidi Hess is a 62 y.o. female who presents today as a new patient to establish care.  HPI  ***  Past Medical History:  Diagnosis Date   Allergy    Cancer (HCC)    Quadriplegia (HCC)    C-6   Past Surgical History:  Procedure Laterality Date   ABDOMINAL HYSTERECTOMY     ROBOTIC ASSISTED SUPRACERVICAL HYSTERECTOMY WITH BILATERAL SALPINGO OOPHERECTOMY Bilateral 03/30/2018   Uterus, cervix, bilateral fallopian tubes and ovaries, hysterectomy and bilateral salpingo-oophorectomy   Family Status  Relation Name Status   Emelda Brothers  (Not Specified)   Cousin  (Not Specified)   Family History  Problem Relation Age of Onset   Breast cancer Paternal Aunt    Breast cancer Cousin        maternal   Social History   Socioeconomic History   Marital status: Single    Spouse name: Not on file   Number of children: Not on file   Years of education: 12   Highest education level: 12th grade  Occupational History   Not on file  Tobacco Use   Smoking status: Former    Types: Cigarettes    Quit date: 2010    Years since quitting: 14.3    Passive exposure: Past   Smokeless tobacco: Never  Vaping Use   Vaping Use: Never used  Substance and Sexual Activity   Alcohol use: Not Currently   Drug use: Never   Sexual activity: Not Currently  Other Topics Concern   Not on file  Social History Narrative   Not on file   Social Determinants of Health   Financial Resource Strain: Low Risk  (12/02/2020)   Overall Financial Resource Strain (CARDIA)    Difficulty of Paying Living Expenses: Not hard at all  Food Insecurity: No Food Insecurity (02/05/2022)   Hunger Vital Sign    Worried About Running Out of Food in the Last Year: Never true    Ran Out of Food in the  Last Year: Never true  Transportation Needs: No Transportation Needs (02/08/2022)   PRAPARE - Administrator, Civil Service (Medical): No    Lack of Transportation (Non-Medical): No  Physical Activity: Inactive (12/02/2020)   Exercise Vital Sign    Days of Exercise per Week: 0 days    Minutes of Exercise per Session: 0 min  Stress: No Stress Concern Present (12/02/2020)   [employer] of Occupational Health - Occupational Stress Questionnaire    Feeling of Stress : Only a little  Social Connections: Unknown (12/02/2020)   Social Connection and Isolation Panel [NHANES]    Frequency of Communication with Friends and Family: More than three times a week    Frequency of Social Gatherings with Friends and Family: More than three times a week    Attends Religious Services: More than 4 times per year    Active Member of [employer] or Organizations: No    Attends Banker Meetings: Never    Marital Status: Not on file   Outpatient Medications Prior to Visit  Medication Sig   Acetaminophen 325 MG CAPS    Ascorbic Acid (VITAMIN C) 500 MG CHEW Chew 500 mg by mouth daily.   baclofen (  LIORESAL) 10 MG tablet Take 1 tablet (10 mg total) by mouth 3 (three) times daily.   bisacodyl (DULCOLAX) 10 MG suppository Place 10 mg rectally daily.   Calcium Carb-Cholecalciferol (OYSTER SHELL CALCIUM) 500-400 MG-UNIT TABS    Control Gel Formula Dressing (DUODERM CGF EXTRA THIN) MISC Apply 1 each topically once a week.   Docusate Sodium (DSS) 250 MG CAPS    fluconazole (DIFLUCAN) 150 MG tablet Take 1 tablet (150 mg total) by mouth daily.   loratadine (CLARITIN) 10 MG tablet Take 10 mg by mouth daily.   Melatonin 1 MG CAPS Take 1 capsule by mouth at bedtime as needed.   Multiple Vitamins-Minerals (CENTRUM ADULTS) TABS    nitrofurantoin (MACRODANTIN) 50 MG capsule Take 1 capsule (50 mg total) by mouth 2 (two) times daily.   nystatin ointment (MYCOSTATIN) Apply 30 g topically daily.    Olopatadine HCl 0.2 % SOLN Place 1 drop into both eyes daily at 2 PM.   oxybutynin (DITROPAN XL) 15 MG 24 hr tablet Take 1 tablet (15 mg total) by mouth daily.   Simethicone 180 MG CAPS Take 180 mg by mouth daily.   Vitamin D, Cholecalciferol, 25 MCG (1000 UT) TABS Take 5,000 Units by mouth daily at 2 PM.   No facility-administered medications prior to visit.   Allergies  Allergen Reactions   Latex Rash   Nickel Dermatitis, Itching and Rash   Sulfa Antibiotics Itching, Nausea Only and Rash    Immunization History  Administered Date(s) Administered   Hepatitis B 09/01/2007   Influenza,inj,Quad PF,6+ Mos 12/13/2018, 12/19/2019, 12/22/2020   Influenza-Unspecified 12/06/2017, 12/15/2017, 12/19/2019, 12/10/2021, 12/10/2021   PFIZER(Purple Top)SARS-COV-2 Vaccination 06/04/2019, 06/27/2019, 05/12/2020   PNEUMOCOCCAL CONJUGATE-20 12/22/2020   Pfizer Covid-19 Vaccine Bivalent Booster 35yrs & up 01/13/2021   Tdap 06/04/2020   Unspecified SARS-COV-2 Vaccination 12/10/2021   Zoster Recombinat (Shingrix) 03/08/2020, 01/13/2021    Health Maintenance  Topic Date Due   Lung Cancer Screening  01/13/2009   Medicare Annual Wellness (AWV)  12/22/2021   COVID-19 Vaccine (6 - 2023-24 season) 02/04/2022   COLONOSCOPY (Pts 45-21yrs Insurance coverage will need to be confirmed)  12/23/2022 (Originally 01/14/2004)   INFLUENZA VACCINE  10/07/2022   MAMMOGRAM  12/11/2023   DTaP/Tdap/Td (2 - Td or Tdap) 06/05/2030   Hepatitis C Screening  Completed   HIV Screening  Completed   Zoster Vaccines- Shingrix  Completed   HPV VACCINES  Aged Out    Patient Care Team: [name], MD as PCP - General (Internal Medicine)  Review of Systems  Constitutional: Negative.   HENT: Negative.    Eyes: Negative.   Respiratory: Negative.    Cardiovascular: Negative.   Gastrointestinal: Negative.   Endocrine: Negative.   Genitourinary: Negative.   Musculoskeletal: Negative.   Skin: Negative.    Allergic/Immunologic: Negative.   Neurological: Negative.   Hematological: Negative.   Psychiatric/Behavioral: Negative.      {Labs  Heme  Chem  Endocrine  Serology  Results Review (optional):23779}   Objective    There were no vitals taken for this visit. {Show previous vital signs (optional):23777}  Physical Exam ***  Depression Screen    12/22/2021   12:02 PM 12/22/2020   11:22 AM 12/02/2020   12:56 PM 12/19/2019   10:57 AM  PHQ 2/9 Scores  PHQ - 2 Score 0 0 0 0   No results found for any visits on 07/13/22.  Assessment & Plan     ***  No follow-ups on file.     {  provider attestation***:1}   Sherlyn Hay, DO  [facility] [phone] (phone) [phone] (fax)  [facility]

## 2022-07-19 ENCOUNTER — Ambulatory Visit: Payer: Self-pay

## 2022-07-19 NOTE — Patient Outreach (Signed)
  Care Coordination   Follow Up Visit Note   07/19/2022 Name: Heidi Hess MRN: [id number] DOB: [birth year]  Heidi Hess is a 62 y.o. year old female who sees Heidi Downs, MD for primary care. I spoke with  Heidi Hess by phone today.  What matters to the patients health and wellness today?  Patient states she was scheduled to see her new primary care provider on 07/13/2022.  She reports her insurance transportation service did not pick her up for her scheduled ride time. She reports calling the transportation service to report no show of driver.  She states it took approximately 1 hour for them to locate the driver and was told driver had Heidi Hess issues.  Patient states she called the primary care provider office to cancel her appointment and rescheduled for next available 08/30/22.  Patient states this was very inconvenient because she needed to see her new doctor and have orders signed for re certification of catheter care.   Patient reports this happened to her with the same transportation service for a Podiatry appointment in March 2024.  Patient states the driver was a no show for her scheduled ride due to car issues and she had to reschedule podiatry appointment.  Patient voiced frustration. She stated this is not only embarrassing but also makes you a no show for your provider visits. Patient request assistance with obtaining re certification for catheter care with [facility].  Patient states she would like acontact phone number to file a complaint related to these transportation issues.   Goals Addressed             This Visit's Progress    Assistance with care coordination activities/ management related to healt conditions/ needs.       Interventions Today    Flowsheet Row Most Recent Value  Chronic Disease   Chronic disease during today's visit Other  General Interventions   General Interventions Discussed/Reviewed General Interventions Reviewed, Doctor Visits,  [facility], Communication with  [Discussed patients concerns regarding transportation no show to her appointment. Call to [facility] regarding order for recertification of catheter care.]  Doctor Visits Discussed/Reviewed Doctor Visits Reviewed  Heidi Hess upcoming provider appointments]  Communication with RN, Social Work  Heidi Hess with Heidi Hess, Director of Care management regarding patient's transportation no shows for her provider appointments. Referral made to social worker to assist with alternate transporation resources.]  Education Interventions   Education Provided Provided Education  Provided Verbal Education On Other  [Patient provided contact phone number for Medicare to discuss concerns regarding transportation benefit.  Patient advised she can contact phone number on her insurance card to file complaint regarding transportation concerns.]  Pharmacy Interventions   Pharmacy Dicussed/Reviewed Pharmacy Topics Reviewed  [medications reviewed / compliance discussed.]                 SDOH assessments and interventions completed:  No     Care Coordination Interventions:  Yes, provided   Follow up plan: Follow up call scheduled for 07/27/22    Encounter Outcome:  Pt. Visit Completed   Heidi Ina RN,BSN,CCM [facility] Care Coordination [phone] direct line

## 2022-07-21 ENCOUNTER — Ambulatory Visit: Payer: Self-pay

## 2022-07-21 NOTE — Patient Outreach (Signed)
  Care Coordination   T/c to connect with community resources  Visit Note   07/21/2022 Name: CHELSIE ZEITLER MRN: [id number] DOB: 08-22-1960  MARJAN FATICA is a 62 y.o. year old female who sees [name], MD for primary care. I  spoke to several resources to collaborate transportation assistance.  What matters to the patients health and wellness today?  Transportation needed    Goals Addressed   None     SDOH assessments and interventions completed:  No     Care Coordination Interventions:  Yes, provided   -SW t/c [employer], [employer], [employer], DSS Transportation, [facility] Care Coordinator Yehuda Mao), [facility].  Patient is not eligible for services either because she has access to transportation with [facility], the service can not assist with getting patient to the vehicle and the slope of her driveway.    Follow up plan: Follow up call scheduled for 07/22/22 at 2pm    Encounter Outcome:  Pt. Visit Completed

## 2022-07-22 ENCOUNTER — Ambulatory Visit: Payer: Self-pay

## 2022-07-22 NOTE — Patient Instructions (Signed)
Visit Information  Thank you for taking time to visit with me today. Please don't hesitate to contact me if I can be of assistance to you.   Following are the goals we discussed today:   Goals Addressed             This Visit's Progress    Transportation to medical appointments       -Patient will continue to use [facility] transportation services -Patient will work directly with [name] at [facility] [phone] to monitor and coordinate transportation -Patient will reach out to Tanya to inquire on other transportation services to use in the future if she is not satisfied with with [facility]         If you are experiencing a Mental Health or Behavioral Health Crisis or need someone to talk to, please call 911  Patient verbalizes understanding of instructions and care plan provided today and agrees to view in MyChart. Active MyChart status and patient understanding of how to access instructions and care plan via MyChart confirmed with patient.     No further follow up required:    Lysle Morales, BSW Social Worker [facility] Care Management  [phone]

## 2022-07-22 NOTE — Patient Outreach (Signed)
  Care Coordination   Initial Visit Note   07/22/2022 Name: Heidi Hess MRN: [id number] DOB: 1960-03-23  Heidi Hess is a 62 y.o. year old female who sees Heidi Downs, MD for primary care. I spoke with  Heidi Hess by phone today.  What matters to the patients health and wellness today?  Patient is not satisfied with [facility] vendor for transportation.  She has rebooked [facility] transportation for May 20 apt.  Patient is working with [name] [facility] to coordinate and monitor transportation.      Goals Addressed             This Visit's Progress    Transportation to medical appointments       -Patient will continue to use [facility] transportation services -Patient will work directly with [name] at [facility] [phone] to monitor and coordinate transportation -Patient will reach out to Tanya to inquire on other transportation services to use in the future if she is not satisfied with with [facility]        SDOH assessments and interventions completed:  Yes  SDOH Interventions Today    Flowsheet Row Most Recent Value  SDOH Interventions   Food Insecurity Interventions Intervention Not Indicated  Housing Interventions Intervention Not Indicated        Care Coordination Interventions:  Yes, provided   -SW T/c [facility] [employer] but they do not bill to insurance and it would cost $140 round trip for the June md visit, which patient reports she can not afford. -SW explained other resources were contacted and they are not able to provide the service due to having access to transportation or due to her slope of her driveway.  Follow up plan: No further intervention required.     Encounter Outcome:  Pt. Visit Completed

## 2022-07-27 ENCOUNTER — Ambulatory Visit: Payer: Self-pay

## 2022-07-27 NOTE — Patient Outreach (Signed)
  Care Coordination   Follow Up Visit Note   07/27/2022 Name: Heidi Hess MRN: [id number] DOB: 09/05/1960  Heidi Hess is a 62 y.o. year old female who sees Heidi Downs, MD for primary care. I spoke with  Heidi Hess by phone today.  What matters to the patients health and wellness today?  Patient reports receiving call from [facility] representative Heidi Hess at [facility] [phone] to discuss transportation issues. Patient states [employer] transportation service did not pick her up for her podiatry appointment on yesterday. Patient states her previous primary care provider signed her re certification papers for home health catheter care due to her being in transition to new primary care provider.   Patient reports new primary care provider appointment scheduled for 08/30/22.    Goals Addressed             This Visit's Progress    Assistance with care coordination activities/ management related to healt conditions/ needs.       Interventions Today    Flowsheet Row Most Recent Value  General Interventions   General Interventions Discussed/Reviewed General Interventions Reviewed  [Confirmed patient received call from [facility] representative regarding transportation. Confirmed patient received follow up with social worker.]  Education Interventions   Education Provided Provided Education  [Advised to call [facility] representative to report  transportation no show on yesterday for podiatry appointment.]                 SDOH assessments and interventions completed:  No     Care Coordination Interventions:  Yes, provided   Follow up plan: Follow up call scheduled for 09/01/22     Encounter Outcome:  Pt. Visit Completed   Heidi Ina RN,BSN,CCM [facility] Care Coordination [phone] direct line

## 2022-08-10 DIAGNOSIS — Z792 Long term (current) use of antibiotics: Secondary | ICD-10-CM | POA: Diagnosis not present

## 2022-08-10 DIAGNOSIS — G8250 Quadriplegia, unspecified: Secondary | ICD-10-CM | POA: Diagnosis not present

## 2022-08-10 DIAGNOSIS — Z7401 Bed confinement status: Secondary | ICD-10-CM | POA: Diagnosis not present

## 2022-08-10 DIAGNOSIS — Z9181 History of falling: Secondary | ICD-10-CM | POA: Diagnosis not present

## 2022-08-10 DIAGNOSIS — Z466 Encounter for fitting and adjustment of urinary device: Secondary | ICD-10-CM | POA: Diagnosis not present

## 2022-08-10 DIAGNOSIS — N390 Urinary tract infection, site not specified: Secondary | ICD-10-CM | POA: Diagnosis not present

## 2022-08-10 DIAGNOSIS — G834 Cauda equina syndrome: Secondary | ICD-10-CM | POA: Diagnosis not present

## 2022-08-17 DIAGNOSIS — S7221XA Displaced subtrochanteric fracture of right femur, initial encounter for closed fracture: Secondary | ICD-10-CM | POA: Diagnosis not present

## 2022-08-20 DIAGNOSIS — Z87440 Personal history of urinary (tract) infections: Secondary | ICD-10-CM | POA: Diagnosis not present

## 2022-08-20 DIAGNOSIS — G8250 Quadriplegia, unspecified: Secondary | ICD-10-CM | POA: Diagnosis not present

## 2022-08-20 DIAGNOSIS — R339 Retention of urine, unspecified: Secondary | ICD-10-CM | POA: Diagnosis not present

## 2022-08-25 DIAGNOSIS — G834 Cauda equina syndrome: Secondary | ICD-10-CM | POA: Diagnosis not present

## 2022-08-25 DIAGNOSIS — Z993 Dependence on wheelchair: Secondary | ICD-10-CM | POA: Diagnosis not present

## 2022-08-25 DIAGNOSIS — G8250 Quadriplegia, unspecified: Secondary | ICD-10-CM | POA: Diagnosis not present

## 2022-08-25 DIAGNOSIS — Z87440 Personal history of urinary (tract) infections: Secondary | ICD-10-CM | POA: Diagnosis not present

## 2022-08-25 DIAGNOSIS — Z466 Encounter for fitting and adjustment of urinary device: Secondary | ICD-10-CM | POA: Diagnosis not present

## 2022-08-26 DIAGNOSIS — G8250 Quadriplegia, unspecified: Secondary | ICD-10-CM | POA: Diagnosis not present

## 2022-08-29 DIAGNOSIS — R339 Retention of urine, unspecified: Secondary | ICD-10-CM | POA: Diagnosis not present

## 2022-08-29 DIAGNOSIS — G8250 Quadriplegia, unspecified: Secondary | ICD-10-CM | POA: Diagnosis not present

## 2022-08-29 DIAGNOSIS — Z87440 Personal history of urinary (tract) infections: Secondary | ICD-10-CM | POA: Diagnosis not present

## 2022-08-30 ENCOUNTER — Telehealth: Payer: Self-pay | Admitting: Family Medicine

## 2022-08-30 ENCOUNTER — Ambulatory Visit (INDEPENDENT_AMBULATORY_CARE_PROVIDER_SITE_OTHER): Payer: 59 | Admitting: Family Medicine

## 2022-08-30 ENCOUNTER — Encounter: Payer: Self-pay | Admitting: Family Medicine

## 2022-08-30 VITALS — BP 116/63 | HR 62 | Temp 98.2°F | Ht 65.0 in

## 2022-08-30 DIAGNOSIS — G8250 Quadriplegia, unspecified: Secondary | ICD-10-CM | POA: Diagnosis not present

## 2022-08-30 DIAGNOSIS — I739 Peripheral vascular disease, unspecified: Secondary | ICD-10-CM

## 2022-08-30 DIAGNOSIS — G834 Cauda equina syndrome: Secondary | ICD-10-CM | POA: Diagnosis not present

## 2022-08-30 DIAGNOSIS — L744 Anhidrosis: Secondary | ICD-10-CM | POA: Diagnosis not present

## 2022-08-30 DIAGNOSIS — G909 Disorder of the autonomic nervous system, unspecified: Secondary | ICD-10-CM | POA: Diagnosis not present

## 2022-08-30 DIAGNOSIS — Z0000 Encounter for general adult medical examination without abnormal findings: Secondary | ICD-10-CM | POA: Diagnosis not present

## 2022-08-30 DIAGNOSIS — M8000XG Age-related osteoporosis with current pathological fracture, unspecified site, subsequent encounter for fracture with delayed healing: Secondary | ICD-10-CM

## 2022-08-30 DIAGNOSIS — Z862 Personal history of diseases of the blood and blood-forming organs and certain disorders involving the immune mechanism: Secondary | ICD-10-CM

## 2022-08-30 DIAGNOSIS — I7389 Other specified peripheral vascular diseases: Secondary | ICD-10-CM

## 2022-08-30 DIAGNOSIS — M8000XA Age-related osteoporosis with current pathological fracture, unspecified site, initial encounter for fracture: Secondary | ICD-10-CM | POA: Insufficient documentation

## 2022-08-30 NOTE — Telephone Encounter (Signed)
Elnita Maxwell calling from [facility] is calling to see if Dr. Payton Mccallum will sign the patient's month folly catheter change?  CB- [phone]

## 2022-08-30 NOTE — Progress Notes (Unsigned)
New patient visit   Patient: Heidi Hess   DOB: 1960-04-28   62 y.o. Female  MRN: [id number] Visit Date: 08/30/2022  Today's healthcare provider: Sherlyn Hay, DO   No chief complaint on file.  Subjective    Heidi Hess is a 62 y.o. female who presents today as a new patient to establish care.  HPI   Because she is here to establish care, patient states that she also needs a home health agency ([name]) referral and orders as well as orders for her foley catheter - Through Healthcare Providers Also has home health providers M-F, which are provided at no cost to her.  She had previously received home health support through [name] but that support follow-up due to a lack of staffing when her previous home health aide quit working at the agency. Lives with mom (65 Y) and sister (has autoimmune hemolytic anemia and receives rituximab)  Colon Ca screen - 2021 Mammogram - Oct 2023; gets them done in [place]. Will schedule in October 2024   Hoping to stay with foley rather than using diapers or switching to a suprapubic catheter, as has been suggested to her in the past - Currently-planned, increased Foley catheter size: 51F 30 mL balloon.  - At the prior size, she was leaking around the catheter. - Gets catheter changed every 3 weeks because she has had frequent UTIs when they are changed at 4 week intervals.  Numb/tingling all the times; can feel tops of arms, not undeside - injured between C5-C6. Spinal fusion in 77  She thinks she broke her leg during transfers during two man lift into wheelchair When hot, has to use cold wash clothes on pressure points to cool down because sdoesn't sweat Headache this mornig, but improved now s/p tylenol Bladder has incomplete emptying, demonstrated on studies  During physical exam, patient endorsed that her legs are persistently cold - Had bilateral lower extemity ultrasound done which was negative for DVT  Patient has poor  dentition and is in need of dental work but endorses that her coverage does not provide for this and she cannot afford it.  This is the most significant reason that the patient is afraid of osteonecrosis of the jaw in relation to osteoporosis treatment.  Patient states she is more willing to risk breaking a bone than developing osteonecrosis of the jaw.   Past Medical History:  Diagnosis Date   Allergy    Cancer (HCC)    endometrial   Osteoporosis    Quadriplegia (HCC)    C-6   Past Surgical History:  Procedure Laterality Date   ABDOMINAL HYSTERECTOMY     CERVICAL SPINE SURGERY     1979   ROBOTIC ASSISTED SUPRACERVICAL HYSTERECTOMY WITH BILATERAL SALPINGO OOPHERECTOMY Bilateral 03/30/2018   Uterus, cervix, bilateral fallopian tubes and ovaries, hysterectomy and bilateral salpingo-oophorectomy   Family Status  Relation Name Status   Mother  (Not Specified)   Father  (Not Specified)   Sister  (Not Specified)   Brother  (Not Specified)   MGM  (Not Specified)   MGF  (Not Specified)   PGM  (Not Specified)   PGF  (Not Specified)   Emelda Brothers  (Not Specified)   Cousin  (Not Specified)   Family History  Problem Relation Age of Onset   Hypertension Mother    Heart disease Father    Cancer Father        throat   Hypertension Father    Hypertension  Sister    Anemia Sister        Autoimmune hemolytic anemia   Hypertension Brother    Hypertension Maternal Grandmother    Stroke Maternal Grandmother    Hypertension Maternal Grandfather    Heart disease Paternal Grandmother    Hypertension Paternal Grandmother    Stroke Paternal Grandmother    Hypertension Paternal Grandfather    Breast cancer Paternal Aunt    Breast cancer Cousin        maternal   Social History   Socioeconomic History   Marital status: Single    Spouse name: Not on file   Number of children: Not on file   Years of education: 12   Highest education level: 8th grade  Occupational History   Not on file   Tobacco Use   Smoking status: Former    Types: Cigarettes    Quit date: 2010    Years since quitting: 14.4    Passive exposure: Past   Smokeless tobacco: Never  Vaping Use   Vaping Use: Never used  Substance and Sexual Activity   Alcohol use: Not Currently   Drug use: Not Currently    Types: Marijuana, Cocaine   Sexual activity: Not Currently  Other Topics Concern   Not on file  Social History Narrative   Not on file   Social Determinants of Health   Financial Resource Strain: Low Risk  (08/27/2022)   Overall Financial Resource Strain (CARDIA)    Difficulty of Paying Living Expenses: Not hard at all  Food Insecurity: No Food Insecurity (08/27/2022)   Hunger Vital Sign    Worried About Running Out of Food in the Last Year: Never true    Ran Out of Food in the Last Year: Never true  Transportation Needs: Unmet Transportation Needs (08/27/2022)   PRAPARE - Transportation    Lack of Transportation (Medical): Yes    Lack of Transportation (Non-Medical): Yes  Physical Activity: Unknown (08/27/2022)   Exercise Vital Sign    Days of Exercise per Week: 0 days    Minutes of Exercise per Session: Not on file  Stress: No Stress Concern Present (08/27/2022)   [employer] of Occupational Health - Occupational Stress Questionnaire    Feeling of Stress : Only a little  Social Connections: Moderately Isolated (08/27/2022)   Social Connection and Isolation Panel [NHANES]    Frequency of Communication with Friends and Family: Three times a week    Frequency of Social Gatherings with Friends and Family: More than three times a week    Attends Religious Services: 1 to 4 times per year    Active Member of [employer] or Organizations: No    Attends Engineer, structural: Not on file    Marital Status: Never married   Outpatient Medications Prior to Visit  Medication Sig   Acetaminophen 325 MG CAPS    Ascorbic Acid (VITAMIN C) 500 MG CHEW Chew 500 mg by mouth daily.   baclofen  (LIORESAL) 10 MG tablet Take 1 tablet (10 mg total) by mouth 3 (three) times daily.   bisacodyl (DULCOLAX) 10 MG suppository Place 10 mg rectally daily.   Calcium Carb-Cholecalciferol (OYSTER SHELL CALCIUM) 500-400 MG-UNIT TABS    Docusate Sodium (DSS) 250 MG CAPS    loratadine (CLARITIN) 10 MG tablet Take 10 mg by mouth daily.   Melatonin 1 MG CAPS Take 1 capsule by mouth at bedtime as needed.   Multiple Vitamins-Minerals (CENTRUM ADULTS) TABS    nitrofurantoin (MACRODANTIN) 50 MG  capsule Take 1 capsule (50 mg total) by mouth 2 (two) times daily.   Olopatadine HCl 0.2 % SOLN Place 1 drop into both eyes daily at 2 PM.   oxybutynin (DITROPAN XL) 15 MG 24 hr tablet Take 1 tablet (15 mg total) by mouth daily.   Simethicone 180 MG CAPS Take 180 mg by mouth daily.   Vitamin D, Cholecalciferol, 25 MCG (1000 UT) TABS Take 5,000 Units by mouth daily at 2 PM.   [DISCONTINUED] Control Gel Formula Dressing (DUODERM CGF EXTRA THIN) MISC Apply 1 each topically once a week.   [DISCONTINUED] fluconazole (DIFLUCAN) 150 MG tablet Take 1 tablet (150 mg total) by mouth daily.   [DISCONTINUED] nystatin ointment (MYCOSTATIN) Apply 30 g topically daily.   No facility-administered medications prior to visit.   Allergies  Allergen Reactions   Latex Rash   Nickel Dermatitis, Itching and Rash   Sulfa Antibiotics Itching, Nausea Only and Rash    Immunization History  Administered Date(s) Administered   Hepatitis B 09/01/2007   Influenza,inj,Quad PF,6+ Mos 12/13/2018, 12/19/2019, 12/22/2020   Influenza-Unspecified 12/06/2017, 12/15/2017, 12/19/2019, 12/10/2021, 12/10/2021   PFIZER(Purple Top)SARS-COV-2 Vaccination 06/04/2019, 06/27/2019, 05/12/2020   PNEUMOCOCCAL CONJUGATE-20 12/22/2020   Pfizer Covid-19 Vaccine Bivalent Booster 35yrs & up 01/13/2021   Tdap 06/04/2020   Unspecified SARS-COV-2 Vaccination 12/10/2021   Zoster Recombinat (Shingrix) 03/08/2020, 01/13/2021    Health Maintenance  Topic Date Due    Lung Cancer Screening  01/13/2009   Medicare Annual Wellness (AWV)  12/22/2021   COVID-19 Vaccine (6 - 2023-24 season) 02/04/2022   Colonoscopy  12/23/2022 (Originally 01/14/2004)   INFLUENZA VACCINE  10/07/2022   MAMMOGRAM  12/11/2023   DTaP/Tdap/Td (2 - Td or Tdap) 06/05/2030   Hepatitis C Screening  Completed   HIV Screening  Completed   Zoster Vaccines- Shingrix  Completed   HPV VACCINES  Aged Out    Patient Care Team: Myrl Lazarus, Monico Blitz, DO as PCP - General (Family Medicine)  Review of Systems  Constitutional: Negative.   HENT: Negative.    Eyes: Negative.   Respiratory: Negative.    Cardiovascular: Negative.   Gastrointestinal: Negative.   Endocrine: Negative.   Genitourinary: Negative.        Baseline incontinence; foley catheter in place  Musculoskeletal: Negative.   Skin: Negative.   Allergic/Immunologic: Negative.   Neurological:  Positive for weakness (baseline incomplete quadriplegia), numbness and headaches (earlier today; improved).  Hematological: Negative.   Psychiatric/Behavioral: Negative.         Objective    BP 116/63 (BP Location: Left Arm, Patient Position: Sitting, Cuff Size: Normal)   Pulse 62   Temp 98.2 F (36.8 C) (Oral)   Ht 5\' 5"  (1.651 m) Comment: estimate  SpO2 100%   BMI 27.12 kg/m    Physical Exam Vitals reviewed.  Constitutional:      General: She is not in acute distress.    Appearance: Normal appearance. She is well-developed. She is not diaphoretic.     Comments: Generalized muscular atrophy noted to all extremities.  HENT:     Head: Normocephalic and atraumatic.     Right Ear: Tympanic membrane, ear canal and external ear normal.     Left Ear: Tympanic membrane, ear canal and external ear normal.     Nose: Nose normal.     Mouth/Throat:     Mouth: Mucous membranes are moist.     Dentition: Abnormal dentition. Dental caries present.     Pharynx: Oropharynx is clear. No oropharyngeal exudate.  Eyes:     General: No  scleral icterus.    Conjunctiva/sclera: Conjunctivae normal.     Pupils: Pupils are equal, round, and reactive to light.  Neck:     Thyroid: No thyromegaly.  Cardiovascular:     Rate and Rhythm: Normal rate and regular rhythm.     Pulses: Normal pulses.          Dorsalis pedis pulses are 2+ on the right side and 2+ on the left side.       Posterior tibial pulses are 2+ on the right side and 2+ on the left side.     Heart sounds: Normal heart sounds. No murmur heard. Pulmonary:     Effort: Pulmonary effort is normal. No respiratory distress.     Breath sounds: Normal breath sounds. No wheezing or rales.  Abdominal:     General: There is no distension.     Palpations: Abdomen is soft.     Tenderness: There is no abdominal tenderness. There is no guarding.  Musculoskeletal:        General: No deformity.     Cervical back: Neck supple.     Right lower leg: No edema.     Left lower leg: No edema.     Right foot: Normal capillary refill.     Left foot: Normal capillary refill.  Lymphadenopathy:     Cervical: No cervical adenopathy.  Skin:    Capillary Refill: Capillary refill takes less than 2 seconds.     Coloration: Skin is cyanotic (BLE).     Findings: No rash.  Neurological:     Mental Status: She is alert and oriented to person, place, and time. Mental status is at baseline.     Sensory: Sensory deficit (sensation absent from just under armpits down and on the underside of her arms) present.     Motor: Weakness (Patient unable to move BLE or fingers; contractures presents in hands bilaterally. Patient is able to move her arms with restricted ROM, especially in posterior extension at the shoulders) present.  Psychiatric:        Mood and Affect: Mood normal.        Behavior: Behavior normal.        Thought Content: Thought content normal.     Depression Screen    08/30/2022    1:22 PM 12/22/2021   12:02 PM 12/22/2020   11:22 AM 12/02/2020   12:56 PM  PHQ 2/9 Scores  PHQ -  2 Score 0 0 0 0  PHQ- 9 Score 2      Results for orders placed or performed in visit on 08/30/22  Comprehensive metabolic panel  Result Value Ref Range   Glucose 83 70 - 99 mg/dL   BUN 10 8 - 27 mg/dL   Creatinine, Ser 4.09 (L) 0.57 - 1.00 mg/dL   eGFR 811 >91 YN/WGN/5.62   BUN/Creatinine Ratio 20 12 - 28   Sodium 141 134 - 144 mmol/L   Potassium 4.1 3.5 - 5.2 mmol/L   Chloride 103 96 - 106 mmol/L   CO2 21 20 - 29 mmol/L   Calcium 9.1 8.7 - 10.3 mg/dL   Total Protein 6.5 6.0 - 8.5 g/dL   Albumin 3.7 (L) 3.9 - 4.9 g/dL   Globulin, Total 2.8 1.5 - 4.5 g/dL   Bilirubin Total 0.4 0.0 - 1.2 mg/dL   Alkaline Phosphatase 156 (H) 44 - 121 IU/L   AST 14 0 - 40 IU/L   ALT 9 0 -  32 IU/L  Lipid panel  Result Value Ref Range   Cholesterol, Total 187 100 - 199 mg/dL   Triglycerides 161 0 - 149 mg/dL   HDL 37 (L) >09 mg/dL   VLDL Cholesterol Cal 20 5 - 40 mg/dL   LDL Chol Calc (NIH) 604 (H) 0 - 99 mg/dL   Chol/HDL Ratio 5.1 (H) 0.0 - 4.4 ratio  VITAMIN D 25 Hydroxy (Vit-D Deficiency, Fractures)  Result Value Ref Range   Vit D, 25-Hydroxy 115.0 (H) 30.0 - 100.0 ng/mL  CBC With Differential  Result Value Ref Range   WBC 7.2 3.4 - 10.8 x10E3/uL   RBC 4.50 3.77 - 5.28 x10E6/uL   Hemoglobin 13.1 11.1 - 15.9 g/dL   Hematocrit 54.0 98.1 - 46.6 %   MCV 90 79 - 97 fL   MCH 29.1 26.6 - 33.0 pg   MCHC 32.3 31.5 - 35.7 g/dL   RDW 19.1 47.8 - 29.5 %   Neutrophils 58 Not Estab. %   Lymphs 33 Not Estab. %   Monocytes 6 Not Estab. %   Eos 2 Not Estab. %   Basos 1 Not Estab. %   Neutrophils Absolute 4.2 1.4 - 7.0 x10E3/uL   Lymphocytes Absolute 2.3 0.7 - 3.1 x10E3/uL   Monocytes Absolute 0.4 0.1 - 0.9 x10E3/uL   EOS (ABSOLUTE) 0.2 0.0 - 0.4 x10E3/uL   Basophils Absolute 0.0 0.0 - 0.2 x10E3/uL   Immature Granulocytes 0 Not Estab. %   Immature Grans (Abs) 0.0 0.0 - 0.1 x10E3/uL  TSH Rfx on Abnormal to Free T4  Result Value Ref Range   TSH 2.250 0.450 - 4.500 uIU/mL  Vitamin B12  Result  Value Ref Range   Vitamin B-12 905 232 - 1,245 pg/mL    Assessment & Plan     1. Annual physical exam Physical exam with abnormalities as noted.  Will check routine blood work detailed below and refer patient to gastroenterology for colonoscopy.  DEXA scan ordered. - Comprehensive metabolic panel - Lipid panel - CBC With Differential - TSH Rfx on Abnormal to Free T4 - Ambulatory referral to Gastroenterology - DG Bone Density; Future  2. Quadriplegia and quadriparesis (HCC) Referred patient to home health as noted below. - Ambulatory referral to Home Health - For Home Use Only Home Health Order  Please increase Foley size to 41F with 30 mL balloon.  3. Age-related osteoporosis with current pathological fracture with delayed healing, subsequent encounter Patient takes vitamin D for osteoporosis with presence of a fragility fracture patient believes was sustained during a transfer.  Will check vitamin D level as noted below.  Patient is not willing to take the usually recommended treatments for osteoporosis due to the risk for jaw necrosis. - VITAMIN D 25 Hydroxy (Vit-D Deficiency, Fractures)  4. Cauda equina compression (HCC) - Ambulatory referral to Home Health - For Home Use Only Home Health Order  Please increase Foley size to 41F with 30 mL balloon.  7. History of anemia Patient does have history of anemia.  Will be checking a CBC today along with a vitamin B12, and vitamin D as above. - Vitamin B12  8. Acrocyanosis (HCC) Patient exhibits a secondary acrocyanosis of both her lower extremities, which are cool to touch.  This is a benign condition.  Patient did previously have ultrasounds done to evaluate her for blood clots, which were normal.  Does have intact pulses and capillary refill.  9.  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     The entirety of the information documented in the History of Present  Illness, Review of Systems and Physical Exam were personally obtained by me. Portions of this information were initially documented by the CMA, Adline Peals, and reviewed by me for thoroughness and accuracy.   I discussed the assessment and treatment plan with the patient  The patient was provided an opportunity to ask questions and all were answered. The patient agreed with the plan and demonstrated an understanding of the instructions.   The patient was advised to call back or seek an in-person evaluation if the symptoms worsen or if the condition fails to improve as anticipated.  Total time was 75 minutes. That includes chart review before the visit, the actual patient visit, and time spent on documentation after the visit.  This does not include time spent on the physical exam.     Sherlyn Hay, DO  [facility] [place] [phone] (phone) [phone] (fax)  [facility]

## 2022-08-30 NOTE — Telephone Encounter (Signed)
Patient returning call. No TE on why patient was called, but patient has a new patient OV today at 1:20 pm. Advise patient it was a reminder call that OV was today. Patient verbal understanding.

## 2022-08-31 ENCOUNTER — Other Ambulatory Visit: Payer: Self-pay | Admitting: Family Medicine

## 2022-08-31 DIAGNOSIS — Z1231 Encounter for screening mammogram for malignant neoplasm of breast: Secondary | ICD-10-CM

## 2022-08-31 LAB — CBC WITH DIFFERENTIAL
Basophils Absolute: 0 10*3/uL (ref 0.0–0.2)
Basos: 1 %
EOS (ABSOLUTE): 0.2 10*3/uL (ref 0.0–0.4)
Eos: 2 %
Hematocrit: 40.6 % (ref 34.0–46.6)
Hemoglobin: 13.1 g/dL (ref 11.1–15.9)
Immature Grans (Abs): 0 10*3/uL (ref 0.0–0.1)
Immature Granulocytes: 0 %
Lymphocytes Absolute: 2.3 10*3/uL (ref 0.7–3.1)
Lymphs: 33 %
MCH: 29.1 pg (ref 26.6–33.0)
MCHC: 32.3 g/dL (ref 31.5–35.7)
MCV: 90 fL (ref 79–97)
Monocytes Absolute: 0.4 10*3/uL (ref 0.1–0.9)
Monocytes: 6 %
Neutrophils Absolute: 4.2 10*3/uL (ref 1.4–7.0)
Neutrophils: 58 %
RBC: 4.5 x10E6/uL (ref 3.77–5.28)
RDW: 13.3 % (ref 11.7–15.4)
WBC: 7.2 10*3/uL (ref 3.4–10.8)

## 2022-08-31 LAB — LIPID PANEL
Chol/HDL Ratio: 5.1 ratio — ABNORMAL HIGH (ref 0.0–4.4)
Cholesterol, Total: 187 mg/dL (ref 100–199)
HDL: 37 mg/dL — ABNORMAL LOW (ref >39)
LDL Chol Calc (NIH): 130 mg/dL — ABNORMAL HIGH (ref 0–99)
Triglycerides: 111 mg/dL (ref 0–149)
VLDL Cholesterol Cal: 20 mg/dL (ref 5–40)

## 2022-08-31 LAB — COMPREHENSIVE METABOLIC PANEL
ALT: 9 IU/L (ref 0–32)
AST: 14 IU/L (ref 0–40)
Albumin: 3.7 g/dL — ABNORMAL LOW (ref 3.9–4.9)
Alkaline Phosphatase: 156 IU/L — ABNORMAL HIGH (ref 44–121)
BUN/Creatinine Ratio: 20 (ref 12–28)
BUN: 10 mg/dL (ref 8–27)
Bilirubin Total: 0.4 mg/dL (ref 0.0–1.2)
CO2: 21 mmol/L (ref 20–29)
Calcium: 9.1 mg/dL (ref 8.7–10.3)
Chloride: 103 mmol/L (ref 96–106)
Creatinine, Ser: 0.5 mg/dL — ABNORMAL LOW (ref 0.57–1.00)
Globulin, Total: 2.8 g/dL (ref 1.5–4.5)
Glucose: 83 mg/dL (ref 70–99)
Potassium: 4.1 mmol/L (ref 3.5–5.2)
Sodium: 141 mmol/L (ref 134–144)
Total Protein: 6.5 g/dL (ref 6.0–8.5)
eGFR: 105 mL/min/{1.73_m2} (ref >59)

## 2022-08-31 LAB — TSH RFX ON ABNORMAL TO FREE T4: TSH: 2.25 u[IU]/mL (ref 0.450–4.500)

## 2022-08-31 LAB — VITAMIN B12: Vitamin B-12: 905 pg/mL (ref 232–1245)

## 2022-08-31 LAB — VITAMIN D 25 HYDROXY (VIT D DEFICIENCY, FRACTURES): Vit D, 25-Hydroxy: 115 ng/mL — ABNORMAL HIGH (ref 30.0–100.0)

## 2022-08-31 NOTE — Telephone Encounter (Signed)
Broadus John from [facility] I would sign for Foley catheter changes

## 2022-09-01 ENCOUNTER — Ambulatory Visit: Payer: Self-pay

## 2022-09-01 NOTE — Patient Outreach (Signed)
  Care Coordination   Follow Up Visit Note   09/01/2022 Name: Heidi Hess MRN: [id number] DOB: Mar 04, 1961  Heidi Hess is a 62 y.o. year old female who sees Pardue, Monico Blitz, DO for primary care. I spoke with  [name] by phone today.  What matters to the patients health and wellness today?  Patient states her re certification was done for her home health services.  She reports having visit with new primary care provider who has agreed to provide new orders for her foley catheter changes.  Patient states she has a new transportation provider with [name] and she is very pleased with them. Patient denies having any further needs or concerns and is agreeable that care coordination goals have been met.    Goals Addressed             This Visit's Progress    Assistance with care coordination activities/ management related to healt conditions/ needs.       Interventions Today    Flowsheet Row Most Recent Value  Chronic Disease   Chronic disease during today's visit Other  [quadrapelegia, urinary incontinence indwelling catheter]  General Interventions   General Interventions Discussed/Reviewed General Interventions Reviewed  [evaluation of current treatment plan for health condition and patients adherence to plan as established by provider. Confirmed pt had recertification and new orders for foley cath changes. Confirmed transportation services arranged for provider appt.]  Doctor Visits Discussed/Reviewed --  [Discussed patient having new provider visit.]  Pharmacy Interventions   Pharmacy Dicussed/Reviewed Pharmacy Topics Reviewed  [medications reviewed / compliance discussed.]                 SDOH assessments and interventions completed:  No     Care Coordination Interventions:  Yes, provided   Follow up plan: No further intervention required.   Encounter Outcome:  Pt. Visit Completed   George Ina RN,BSN,CCM [facility] Care  Coordination [phone] direct line

## 2022-09-02 ENCOUNTER — Encounter: Payer: Self-pay | Admitting: Family Medicine

## 2022-09-06 ENCOUNTER — Telehealth: Payer: Self-pay

## 2022-09-06 DIAGNOSIS — G834 Cauda equina syndrome: Secondary | ICD-10-CM | POA: Diagnosis not present

## 2022-09-06 DIAGNOSIS — G8250 Quadriplegia, unspecified: Secondary | ICD-10-CM | POA: Diagnosis not present

## 2022-09-06 DIAGNOSIS — Z466 Encounter for fitting and adjustment of urinary device: Secondary | ICD-10-CM | POA: Diagnosis not present

## 2022-09-06 DIAGNOSIS — Z993 Dependence on wheelchair: Secondary | ICD-10-CM | POA: Diagnosis not present

## 2022-09-06 DIAGNOSIS — N898 Other specified noninflammatory disorders of vagina: Secondary | ICD-10-CM

## 2022-09-06 DIAGNOSIS — Z87440 Personal history of urinary (tract) infections: Secondary | ICD-10-CM | POA: Diagnosis not present

## 2022-09-06 MED ORDER — FLUCONAZOLE 150 MG PO TABS: 150.0000 mg | ORAL_TABLET | Freq: Once | ORAL | 0 refills | Status: AC

## 2022-09-06 NOTE — Telephone Encounter (Signed)
Copied from CRM [id number]. Topic: General - Other >> Sep 06, 2022 10:25 AM Everette C wrote: Caller/Agency: Angelica Chessman / [name]  Callback Number: [phone] Requesting OT/PT/Skilled Nursing/Social Work/Speech Therapy: Skilled Nursing  Frequency: 1x order for urinalysis and culture

## 2022-09-06 NOTE — Addendum Note (Signed)
Addended byJacquenette Shone on: 09/06/2022 01:28 PM   Modules accepted: Orders

## 2022-09-07 DIAGNOSIS — R319 Hematuria, unspecified: Secondary | ICD-10-CM | POA: Diagnosis not present

## 2022-09-07 DIAGNOSIS — G8250 Quadriplegia, unspecified: Secondary | ICD-10-CM | POA: Diagnosis not present

## 2022-09-07 DIAGNOSIS — Z993 Dependence on wheelchair: Secondary | ICD-10-CM | POA: Diagnosis not present

## 2022-09-07 DIAGNOSIS — G834 Cauda equina syndrome: Secondary | ICD-10-CM | POA: Diagnosis not present

## 2022-09-07 DIAGNOSIS — Z466 Encounter for fitting and adjustment of urinary device: Secondary | ICD-10-CM | POA: Diagnosis not present

## 2022-09-07 DIAGNOSIS — Z87440 Personal history of urinary (tract) infections: Secondary | ICD-10-CM | POA: Diagnosis not present

## 2022-09-14 ENCOUNTER — Telehealth: Payer: Self-pay | Admitting: Family Medicine

## 2022-09-14 DIAGNOSIS — T83511A Infection and inflammatory reaction due to indwelling urethral catheter, initial encounter: Secondary | ICD-10-CM

## 2022-09-14 DIAGNOSIS — B9620 Unspecified Escherichia coli [E. coli] as the cause of diseases classified elsewhere: Secondary | ICD-10-CM

## 2022-09-14 DIAGNOSIS — N3001 Acute cystitis with hematuria: Secondary | ICD-10-CM

## 2022-09-14 MED ORDER — AMOXICILLIN-POT CLAVULANATE 875-125 MG PO TABS: 1.0000 | ORAL_TABLET | Freq: Three times a day (TID) | ORAL | 0 refills | Status: DC

## 2022-09-14 NOTE — Telephone Encounter (Signed)
Authorized to replacement every 3 weeks of Foley catheter.  Sent in prescription for antibiotic for patient's urinary tract infection after receiving fax regarding this.  Fax will be scanned into the chart.  [name] home [facility] called and informed to the patient she would be receiving the antibiotic.

## 2022-09-14 NOTE — Telephone Encounter (Signed)
Home Health Verbal Orders - Caller/Agency: tiffany with [name]   Callback Number: [phone] opt 2 Requesting: Nursing/change catheter Frequency: 1x3

## 2022-09-17 DIAGNOSIS — Z87440 Personal history of urinary (tract) infections: Secondary | ICD-10-CM | POA: Diagnosis not present

## 2022-09-17 DIAGNOSIS — R339 Retention of urine, unspecified: Secondary | ICD-10-CM | POA: Diagnosis not present

## 2022-09-17 DIAGNOSIS — G8250 Quadriplegia, unspecified: Secondary | ICD-10-CM | POA: Diagnosis not present

## 2022-09-21 ENCOUNTER — Ambulatory Visit: Payer: 59

## 2022-09-21 VITALS — Ht 65.0 in | Wt 163.0 lb

## 2022-09-21 DIAGNOSIS — Z0000 Encounter for general adult medical examination without abnormal findings: Secondary | ICD-10-CM

## 2022-09-21 NOTE — Progress Notes (Signed)
Subjective:   Heidi Hess is a 62 y.o. female who presents for Medicare Annual (Subsequent) preventive examination.  Visit Complete: Virtual  I connected with  Heidi Hess on 09/21/22 by a audio enabled telemedicine application and verified that I am speaking with the correct person using two identifiers.  Patient Location: Home  Provider Location: Office/Clinic  I discussed the limitations of evaluation and management by telemedicine. The patient expressed understanding and agreed to proceed.  Patient Medicare AWV questionnaire was completed by the patient on 09/20/22; I have confirmed that all information answered by patient is correct and no changes since this date.  Review of Systems    Cardiac Risk Factors include: sedentary lifestyle    Objective:    Today's Vitals   09/21/22 1139  Weight: 163 lb (73.9 kg)  Height: 5\' 5"  (1.651 m)   Body mass index is 27.12 kg/m.     09/21/2022   11:47 AM 02/02/2022    2:48 PM 05/17/2018   11:34 AM  Advanced Directives  Does Patient Have a Medical Advance Directive? No No No  Would patient like information on creating a medical advance directive?   No - Patient declined    Current Medications (verified) Outpatient Encounter Medications as of 09/21/2022  Medication Sig   Acetaminophen 325 MG CAPS    amoxicillin-clavulanate (AUGMENTIN) 875-125 MG tablet Take 1 tablet by mouth every 8 (eight) hours.   Ascorbic Acid (VITAMIN C) 500 MG CHEW Chew 500 mg by mouth daily.   baclofen (LIORESAL) 10 MG tablet Take 1 tablet (10 mg total) by mouth 3 (three) times daily.   bisacodyl (DULCOLAX) 10 MG suppository Place 10 mg rectally daily.   Calcium Carb-Cholecalciferol (OYSTER SHELL CALCIUM) 500-400 MG-UNIT TABS    Docusate Sodium (DSS) 250 MG CAPS    loratadine (CLARITIN) 10 MG tablet Take 10 mg by mouth daily.   Melatonin 1 MG CAPS Take 1 capsule by mouth at bedtime as needed.   Multiple Vitamins-Minerals (CENTRUM ADULTS) TABS     nitrofurantoin (MACRODANTIN) 50 MG capsule Take 1 capsule (50 mg total) by mouth 2 (two) times daily.   Olopatadine HCl 0.2 % SOLN Place 1 drop into both eyes daily at 2 PM.   oxybutynin (DITROPAN XL) 15 MG 24 hr tablet Take 1 tablet (15 mg total) by mouth daily.   Simethicone 180 MG CAPS Take 180 mg by mouth daily.   Vitamin D, Cholecalciferol, 25 MCG (1000 UT) TABS Take 5,000 Units by mouth daily at 2 PM.   No facility-administered encounter medications on file as of 09/21/2022.    Allergies (verified) Latex, Nickel, and Sulfa antibiotics   History: Past Medical History:  Diagnosis Date   Allergy    Cancer (HCC)    endometrial   Osteoporosis    Quadriplegia (HCC)    C-6   Past Surgical History:  Procedure Laterality Date   ABDOMINAL HYSTERECTOMY     CERVICAL SPINE SURGERY     1979   ROBOTIC ASSISTED SUPRACERVICAL HYSTERECTOMY WITH BILATERAL SALPINGO OOPHERECTOMY Bilateral 03/30/2018   Uterus, cervix, bilateral fallopian tubes and ovaries, hysterectomy and bilateral salpingo-oophorectomy   SPINE SURGERY  August [facility] at [facility]   l fusion 5 and 6 vertebrae   Family History  Problem Relation Age of Onset   Hypertension Mother    Arthritis Mother    Hearing loss Mother    Heart disease Father    Cancer Father        throat  Hypertension Father    Alcohol abuse Father    Arthritis Father    Hypertension Sister    Anemia Sister        Autoimmune hemolytic anemia   Hypertension Brother    Hypertension Maternal Grandmother    Stroke Maternal Grandmother    Arthritis Maternal Grandmother    Varicose Veins Maternal Grandmother    Hypertension Maternal Grandfather    Diabetes Maternal Grandfather    Hearing loss Maternal Grandfather    Heart disease Maternal Grandfather    Heart disease Paternal Grandmother    Hypertension Paternal Grandmother    Stroke Paternal Grandmother    Hypertension Paternal Grandfather    Breast cancer Paternal Aunt    Breast cancer Cousin         maternal   Arthritis Maternal Aunt    Cancer Maternal Aunt    Hypertension Maternal Aunt    Arthritis Sister    COPD Sister    Hypertension Sister    Miscarriages / Stillbirths Sister    Varicose Veins Sister    COPD Brother    Drug abuse Brother    Social History   Socioeconomic History   Marital status: Single    Spouse name: Not on file   Number of children: Not on file   Years of education: 12   Highest education level: 8th grade  Occupational History   Not on file  Tobacco Use   Smoking status: Former    Current packs/day: 0.00    Average packs/day: 1 pack/day for 32.0 years (32.0 ttl pk-yrs)    Types: Cigarettes    Quit date: 2010    Years since quitting: 14.5    Passive exposure: Past   Smokeless tobacco: Never   Tobacco comments:    do not smoke anymore  Vaping Use   Vaping status: Never Used  Substance and Sexual Activity   Alcohol use: Not Currently   Drug use: Not Currently    Types: Cocaine, Marijuana    Comment: no drugs in 30 years   Sexual activity: Not Currently  Other Topics Concern   Not on file  Social History Narrative   Not on file   Social Determinants of Health   Financial Resource Strain: Low Risk  (09/20/2022)   Overall Financial Resource Strain (CARDIA)    Difficulty of Paying Living Expenses: Not hard at all  Food Insecurity: No Food Insecurity (09/20/2022)   Hunger Vital Sign    Worried About Running Out of Food in the Last Year: Never true    Ran Out of Food in the Last Year: Never true  Transportation Needs: Unmet Transportation Needs (09/20/2022)   PRAPARE - Administrator, Civil Service (Medical): Yes    Lack of Transportation (Non-Medical): No  Physical Activity: Inactive (09/20/2022)   Exercise Vital Sign    Days of Exercise per Week: 0 days    Minutes of Exercise per Session: 0 min  Stress: Stress Concern Present (09/20/2022)   [employer] of Occupational Health - Occupational Stress Questionnaire     Feeling of Stress : To some extent  Social Connections: Moderately Isolated (09/20/2022)   Social Connection and Isolation Panel [NHANES]    Frequency of Communication with Friends and Family: More than three times a week    Frequency of Social Gatherings with Friends and Family: More than three times a week    Attends Religious Services: 1 to 4 times per year    Active Member of [employer]  or Organizations: No    Attends Banker Meetings: Never    Marital Status: Never married    Tobacco Counseling Counseling given: Not Answered Tobacco comments: do not smoke anymore   Clinical Intake:  Pre-visit preparation completed: Yes  Pain : No/denies pain     BMI - recorded: 27.12 Nutritional Status: BMI 25 -29 Overweight Nutritional Risks: None Diabetes: No  How often do you need to have someone help you when you read instructions, pamphlets, or other written materials from your doctor or pharmacy?: 1 - Never  Interpreter Needed?: No  Comments: lives with mon and siblings Information entered by :: B.Adama Ivins,LPN   Activities of Daily Living    09/20/2022    4:17 PM 08/30/2022    1:23 PM  In your present state of health, do you have any difficulty performing the following activities:  Hearing? 0 0  Vision? 0 0  Difficulty concentrating or making decisions? 0 0  Walking or climbing stairs? 1 1  Dressing or bathing? 1 1  Doing errands, shopping? 1 1  Preparing Food and eating ? Y   Using the Toilet? Y   In the past six months, have you accidently leaked urine? Y   Do you have problems with loss of bowel control? Y   Managing your Medications? N   Managing your Finances? N   Housekeeping or managing your Housekeeping? Y     Patient Care Team: Sherlyn Hay, DO as PCP - General (Family Medicine)  Indicate any recent Medical Services you may have received from other than [facility] providers in the past year (date may be approximate).     Assessment:   This is a  routine wellness examination for [facility].  Hearing/Vision screen Hearing Screening - Comments:: Adequate hearing Vision Screening - Comments:: Adequate vision;readers only [facility] a couple years ago  Dietary issues and exercise activities discussed:     Goals Addressed             This Visit's Progress    Transportation to medical appointments   On track    -Patient will continue to use [facility] transportation services -Patient will work directly with [name] at [facility] [phone] to monitor and coordinate transportation -Patient will reach out to Tanya to inquire on other transportation services to use in the future if she is not satisfied with with [facility]       Depression Screen    09/21/2022   11:46 AM 08/30/2022    1:22 PM 12/22/2021   12:02 PM 12/22/2020   11:22 AM 12/02/2020   12:56 PM 12/19/2019   10:57 AM 05/17/2018   11:30 AM  PHQ 2/9 Scores  PHQ - 2 Score 0 0 0 0 0 0 0  PHQ- 9 Score  2         Fall Risk    09/20/2022    4:17 PM 08/30/2022    1:22 PM 12/22/2020   11:22 AM 12/02/2020   12:58 PM 09/17/2020   12:44 PM  Fall Risk   Falls in the past year? 1 0 0 0 0  Number falls in past yr: 0 0 0 0 0  Injury with Fall? 0 0 0 0 0  Risk for fall due to : No Fall Risks  No Fall Risks Impaired balance/gait;Impaired mobility Impaired mobility  Follow up Falls prevention discussed;Follow up appointment  Falls evaluation completed Falls evaluation completed;Education provided;Falls prevention discussed Falls evaluation completed    MEDICARE RISK AT HOME:  TIMED UP AND GO:  Was the test performed?  No    Cognitive Function:        09/21/2022   11:51 AM  6CIT Screen  What Year? 0 points  What month? 0 points  What time? 0 points  Count back from 20 0 points  Months in reverse 0 points  Repeat phrase 0 points  Total Score 0 points    Immunizations Immunization History  Administered Date(s) Administered   Hepatitis B 09/01/2007   Influenza,inj,Quad  PF,6+ Mos 12/13/2018, 12/19/2019, 12/22/2020   Influenza-Unspecified 12/06/2017, 12/15/2017, 12/19/2019, 12/10/2021, 12/10/2021   PFIZER(Purple Top)SARS-COV-2 Vaccination 06/04/2019, 06/27/2019, 05/12/2020   PNEUMOCOCCAL CONJUGATE-20 12/22/2020   Pfizer Covid-19 Vaccine Bivalent Booster 62yrs & up 01/13/2021   Tdap 06/04/2020   Unspecified SARS-COV-2 Vaccination 12/10/2021   Zoster Recombinant(Shingrix) 03/08/2020, 01/13/2021    TDAP status: Up to date  Flu Vaccine status: Up to date  Pneumococcal vaccine status: Up to date  Covid-19 vaccine status: Completed vaccines  Qualifies for Shingles Vaccine? Yes   Zostavax completed Yes   Shingrix Completed?: Yes  Screening Tests Health Maintenance  Topic Date Due   Lung Cancer Screening  01/13/2009   COVID-19 Vaccine (6 - 2023-24 season) 02/04/2022   Colonoscopy  12/23/2022 (Originally 01/14/2004)   INFLUENZA VACCINE  10/07/2022   Medicare Annual Wellness (AWV)  09/21/2023   MAMMOGRAM  12/11/2023   DTaP/Tdap/Td (2 - Td or Tdap) 06/05/2030   Hepatitis C Screening  Completed   HIV Screening  Completed   Zoster Vaccines- Shingrix  Completed   HPV VACCINES  Aged Out    Health Maintenance  Health Maintenance Due  Topic Date Due   Lung Cancer Screening  01/13/2009   COVID-19 Vaccine (6 - 2023-24 season) 02/04/2022    Colorectal cancer screening: Type of screening: Cologuard. Completed yes. Repeat every 3 years  Mammogram status: Completed yes. Repeat every year  Bone Density status: Completed yes. Results reflect: Bone density results: OSTEOPOROSIS. Repeat every 3 years.  Lung Cancer Screening: (Low Dose CT Chest recommended if Age 15-80 years, 20 pack-year currently smoking OR have quit w/in 15years.) does not qualify.   Lung Cancer Screening Referral: no had already  Additional Screening:  Hepatitis C Screening: does not qualify; Completed yes  Vision Screening: Recommended annual ophthalmology exams for early  detection of glaucoma and other disorders of the eye. Is the patient up to date with their annual eye exam?  No  Who is the provider or what is the name of the office in which the patient attends annual eye exams? Was [facility] but will change If pt is not established with a provider, would they like to be referred to a provider to establish care? No .   Dental Screening: Recommended annual dental exams for proper oral hygiene  Diabetic Foot Exam: n/a  Community Resource Referral / Chronic Care Management: CRR required this visit?  No   CCM required this visit?  No     Plan:     I have personally reviewed and noted the following in the patient's chart:   Medical and social history Use of alcohol, tobacco or illicit drugs  Current medications and supplements including opioid prescriptions. Patient is not currently taking opioid prescriptions. Functional ability and status Nutritional status Physical activity Advanced directives List of other physicians Hospitalizations, surgeries, and ER visits in previous 12 months Vitals Screenings to include cognitive, depression, and falls Referrals and appointments  In addition, I have reviewed and discussed with patient  certain preventive protocols, quality metrics, and best practice recommendations. A written personalized care plan for preventive services as well as general preventive health recommendations were provided to patient.     Sue Lush, LPN   1/61/0960   After Visit Summary: (MyChart) Due to this being a telephonic visit, the after visit summary with patients personalized plan was offered to patient via MyChart   Nurse Notes: Pt is quadriplegic who is wheelchair bound. She relays she has an aid to come 5 days per week and perform ADL's and get her up to her wheelchair. She relays she recently obtained transportation to medical appts and will set up Eye exam when able.She voices no concerns or no questions at his  time.

## 2022-09-25 DIAGNOSIS — G8250 Quadriplegia, unspecified: Secondary | ICD-10-CM | POA: Diagnosis not present

## 2022-09-28 DIAGNOSIS — R339 Retention of urine, unspecified: Secondary | ICD-10-CM | POA: Diagnosis not present

## 2022-09-28 DIAGNOSIS — G8250 Quadriplegia, unspecified: Secondary | ICD-10-CM | POA: Diagnosis not present

## 2022-09-28 DIAGNOSIS — Z87440 Personal history of urinary (tract) infections: Secondary | ICD-10-CM | POA: Diagnosis not present

## 2022-09-29 DIAGNOSIS — R339 Retention of urine, unspecified: Secondary | ICD-10-CM | POA: Diagnosis not present

## 2022-09-29 DIAGNOSIS — Z87440 Personal history of urinary (tract) infections: Secondary | ICD-10-CM | POA: Diagnosis not present

## 2022-09-29 DIAGNOSIS — G8250 Quadriplegia, unspecified: Secondary | ICD-10-CM | POA: Diagnosis not present

## 2022-10-05 DIAGNOSIS — Z87440 Personal history of urinary (tract) infections: Secondary | ICD-10-CM | POA: Diagnosis not present

## 2022-10-05 DIAGNOSIS — Z466 Encounter for fitting and adjustment of urinary device: Secondary | ICD-10-CM | POA: Diagnosis not present

## 2022-10-05 DIAGNOSIS — Z993 Dependence on wheelchair: Secondary | ICD-10-CM | POA: Diagnosis not present

## 2022-10-05 DIAGNOSIS — G8250 Quadriplegia, unspecified: Secondary | ICD-10-CM | POA: Diagnosis not present

## 2022-10-05 DIAGNOSIS — G834 Cauda equina syndrome: Secondary | ICD-10-CM | POA: Diagnosis not present

## 2022-10-18 DIAGNOSIS — Z87440 Personal history of urinary (tract) infections: Secondary | ICD-10-CM | POA: Diagnosis not present

## 2022-10-18 DIAGNOSIS — G8250 Quadriplegia, unspecified: Secondary | ICD-10-CM | POA: Diagnosis not present

## 2022-10-18 DIAGNOSIS — R339 Retention of urine, unspecified: Secondary | ICD-10-CM | POA: Diagnosis not present

## 2022-10-25 DIAGNOSIS — Z993 Dependence on wheelchair: Secondary | ICD-10-CM | POA: Diagnosis not present

## 2022-10-25 DIAGNOSIS — Z466 Encounter for fitting and adjustment of urinary device: Secondary | ICD-10-CM | POA: Diagnosis not present

## 2022-10-25 DIAGNOSIS — Z87440 Personal history of urinary (tract) infections: Secondary | ICD-10-CM | POA: Diagnosis not present

## 2022-10-25 DIAGNOSIS — G834 Cauda equina syndrome: Secondary | ICD-10-CM | POA: Diagnosis not present

## 2022-10-25 DIAGNOSIS — G8250 Quadriplegia, unspecified: Secondary | ICD-10-CM | POA: Diagnosis not present

## 2022-10-26 ENCOUNTER — Telehealth: Payer: Self-pay | Admitting: Family Medicine

## 2022-10-26 NOTE — Telephone Encounter (Signed)
Ok for verbal approval of orders for skilled nursing with catheter change every 3 weeks.

## 2022-10-26 NOTE — Telephone Encounter (Signed)
Home Health Verbal Orders - Caller/Agency: Elnita Maxwell from [name] Callback Number: [phone] Requesting Skilled Nursing Frequency: change catheter every 3 weeks

## 2022-10-27 ENCOUNTER — Other Ambulatory Visit: Payer: Self-pay | Admitting: Family Medicine

## 2022-10-27 DIAGNOSIS — Z2989 Encounter for other specified prophylactic measures: Secondary | ICD-10-CM

## 2022-10-27 NOTE — Telephone Encounter (Signed)
Medication Refill - Medication: baclofen (LIORESAL) 10 MG tablet, nitrofurantoin (MACRODANTIN) 50 MG capsule    Has the patient contacted their pharmacy? Yes.    (Agent: If yes, when and what did the pharmacy advise?)  Preferred Pharmacy (with phone number or street name):  [facility] [place] [place] - [address]  [address] [place] [address]  Phone: [phone] Fax: [phone]  Hours: Not open 24 hours   Has the patient been seen for an appointment in the last year OR does the patient have an upcoming appointment? Yes.    Agent: Please be advised that RX refills may take up to 3 business days. We ask that you follow-up with your pharmacy.

## 2022-10-27 NOTE — Telephone Encounter (Signed)
Left voicemail giving verbal approval of orders for skilled nursing with catheter change every 3 weeks, as voicemail was identified as being confidential.

## 2022-10-28 NOTE — Telephone Encounter (Signed)
Requested Prescriptions  Pending Prescriptions Disp Refills   baclofen (LIORESAL) 10 MG tablet 90 tablet 3     Analgesics:  Muscle Relaxants - baclofen Failed - 10/27/2022  2:21 PM      Failed - Cr in normal range and within 180 days    Creat  Date Value Ref Range Status  12/22/2021 0.47 (L) 0.50 - 1.05 mg/dL Final   Creatinine, Ser  Date Value Ref Range Status  08/30/2022 0.50 (L) 0.57 - 1.00 mg/dL Final         Passed - eGFR is 30 or above and within 180 days    GFR, Est African American  Date Value Ref Range Status  12/19/2019 124 > OR = 60 mL/min/1.52m2 Final   GFR, Est Non African American  Date Value Ref Range Status  12/19/2019 107 > OR = 60 mL/min/1.30m2 Final   eGFR  Date Value Ref Range Status  08/30/2022 105 >59 mL/min/1.73 Final         Passed - Valid encounter within last 6 months    Recent Outpatient Visits           1 month ago Annual physical exam   [facility] Pard[facility], Monico Blitz, DO       Future Appointments             In 1 month Pardue, Monico Blitz, DO [facility]             nitrofurantoin (MACRODANTIN) 50 MG capsule 180 capsule 1    Sig: Take 1 capsule (50 mg total) by mouth 2 (two) times daily.     Off-Protocol Failed - 10/27/2022  2:21 PM      Failed - Medication not assigned to a protocol, review manually.      Passed - Valid encounter within last 12 months    Recent Outpatient Visits           1 month ago Annual physical exam   [facility] P[facility], Monico Blitz, DO       Future Appointments             In 1 month Pardue, Monico Blitz, DO [place], [facility]

## 2022-10-28 NOTE — Telephone Encounter (Signed)
Requested medication (s) are due for refill today: For review  Requested medication (s) are on the active medication list: yes    Last refill: 02/23/22  #180  1 refill  Future visit scheduled yes 12/21/22  Notes to clinic:Not delegated, historical provider, please review. Thank  you.  Requested Prescriptions  Pending Prescriptions Disp Refills   nitrofurantoin (MACRODANTIN) 50 MG capsule 180 capsule 1    Sig: Take 1 capsule (50 mg total) by mouth 2 (two) times daily.     Off-Protocol Failed - 10/27/2022  2:21 PM      Failed - Medication not assigned to a protocol, review manually.      Passed - Valid encounter within last 12 months    Recent Outpatient Visits           1 month ago Annual physical exam   [facility] P[facility], Monico Blitz, DO       Future Appointments             In 1 month Pardue, Monico Blitz, DO [facility]            Refused Prescriptions Disp Refills   baclofen (LIORESAL) 10 MG tablet 90 tablet 3     Analgesics:  Muscle Relaxants - baclofen Failed - 10/27/2022  2:21 PM      Failed - Cr in normal range and within 180 days    Creat  Date Value Ref Range Status  12/22/2021 0.47 (L) 0.50 - 1.05 mg/dL Final   Creatinine, Ser  Date Value Ref Range Status  08/30/2022 0.50 (L) 0.57 - 1.00 mg/dL Final         Passed - eGFR is 30 or above and within 180 days    GFR, Est African American  Date Value Ref Range Status  12/19/2019 124 > OR = 60 mL/min/1.34m2 Final   GFR, Est Non African American  Date Value Ref Range Status  12/19/2019 107 > OR = 60 mL/min/1.60m2 Final   eGFR  Date Value Ref Range Status  08/30/2022 105 >59 mL/min/1.73 Final         Passed - Valid encounter within last 6 months    Recent Outpatient Visits           1 month ago Annual physical exam   [facility] Pardue, Monico Blitz, DO       Future Appointments             In 1 month Pardue, Monico Blitz, DO [facility], [facility]

## 2022-10-29 ENCOUNTER — Other Ambulatory Visit: Payer: 59

## 2022-10-29 DIAGNOSIS — G8250 Quadriplegia, unspecified: Secondary | ICD-10-CM | POA: Diagnosis not present

## 2022-10-29 DIAGNOSIS — R339 Retention of urine, unspecified: Secondary | ICD-10-CM | POA: Diagnosis not present

## 2022-10-29 DIAGNOSIS — Z87440 Personal history of urinary (tract) infections: Secondary | ICD-10-CM | POA: Diagnosis not present

## 2022-11-04 ENCOUNTER — Ambulatory Visit: Payer: 59 | Admitting: Podiatry

## 2022-11-05 ENCOUNTER — Encounter: Payer: Self-pay | Admitting: Podiatry

## 2022-11-05 ENCOUNTER — Ambulatory Visit (INDEPENDENT_AMBULATORY_CARE_PROVIDER_SITE_OTHER): Payer: 59 | Admitting: Podiatry

## 2022-11-05 DIAGNOSIS — M79674 Pain in right toe(s): Secondary | ICD-10-CM | POA: Diagnosis not present

## 2022-11-05 DIAGNOSIS — B351 Tinea unguium: Secondary | ICD-10-CM | POA: Diagnosis not present

## 2022-11-05 DIAGNOSIS — M79675 Pain in left toe(s): Secondary | ICD-10-CM | POA: Diagnosis not present

## 2022-11-05 NOTE — Progress Notes (Signed)
   Chief Complaint  Patient presents with   Nail Problem    "Toenails and look at my feet and make sure they are okay." N - toenails L - bilateral 1-5 D - early summer O - gradually worse C - long, quadriplegic A - none T - niece cut them the last time    SUBJECTIVE Patient PmHx quadriplegia nonambulatory in a wheelchair presents to office today complaining of elongated, thickened nails that cause pain while ambulating in shoes.  Patient is unable to trim their own nails. Patient is here for further evaluation and treatment.  Past Medical History:  Diagnosis Date   Allergy    Cancer (HCC)    endometrial   Osteoporosis    Quadriplegia (HCC)    C-6    Allergies  Allergen Reactions   Latex Rash   Nickel Dermatitis, Itching and Rash   Sulfa Antibiotics Itching, Nausea Only and Rash     OBJECTIVE General Patient is awake, alert, and oriented x 3 and in no acute distress. Derm Skin is dry and supple bilateral. Negative open lesions or macerations. Remaining integument unremarkable. Nails are tender, long, thickened and dystrophic with subungual debris, consistent with onychomycosis, 1-5 bilateral. No signs of infection noted. Vasc  DP and PT pedal pulses palpable bilaterally. Temperature gradient within normal limits.  Neuro Epicritic and protective threshold sensation grossly intact bilaterally.  Musculoskeletal Exam No symptomatic pedal deformities noted bilateral. Muscular strength within normal limits.  ASSESSMENT 1.  Pain due to onychomycosis of toenails both  PLAN OF CARE 1. Patient evaluated today.  2. Instructed to maintain good pedal hygiene and foot care.  3. Mechanical debridement of nails 1-5 bilaterally performed using a nail nipper. Filed with dremel without incident.  4. Return to clinic as needed   Felecia Shelling, DPM [facility]  Dr. Felecia Shelling, DPM    [address] [place], [place] [address]                 Office [phone]  Fax [phone]

## 2022-11-18 DIAGNOSIS — Z87440 Personal history of urinary (tract) infections: Secondary | ICD-10-CM | POA: Diagnosis not present

## 2022-11-18 DIAGNOSIS — Z993 Dependence on wheelchair: Secondary | ICD-10-CM | POA: Diagnosis not present

## 2022-11-18 DIAGNOSIS — R339 Retention of urine, unspecified: Secondary | ICD-10-CM | POA: Diagnosis not present

## 2022-11-18 DIAGNOSIS — Z466 Encounter for fitting and adjustment of urinary device: Secondary | ICD-10-CM | POA: Diagnosis not present

## 2022-11-18 DIAGNOSIS — G8250 Quadriplegia, unspecified: Secondary | ICD-10-CM | POA: Diagnosis not present

## 2022-11-18 DIAGNOSIS — N390 Urinary tract infection, site not specified: Secondary | ICD-10-CM | POA: Diagnosis not present

## 2022-11-18 DIAGNOSIS — G834 Cauda equina syndrome: Secondary | ICD-10-CM | POA: Diagnosis not present

## 2022-11-22 DIAGNOSIS — G8250 Quadriplegia, unspecified: Secondary | ICD-10-CM | POA: Diagnosis not present

## 2022-11-22 DIAGNOSIS — Z87440 Personal history of urinary (tract) infections: Secondary | ICD-10-CM | POA: Diagnosis not present

## 2022-11-22 DIAGNOSIS — R339 Retention of urine, unspecified: Secondary | ICD-10-CM | POA: Diagnosis not present

## 2022-11-30 DIAGNOSIS — Z87440 Personal history of urinary (tract) infections: Secondary | ICD-10-CM | POA: Diagnosis not present

## 2022-11-30 DIAGNOSIS — G8250 Quadriplegia, unspecified: Secondary | ICD-10-CM | POA: Diagnosis not present

## 2022-11-30 DIAGNOSIS — R339 Retention of urine, unspecified: Secondary | ICD-10-CM | POA: Diagnosis not present

## 2022-12-02 DIAGNOSIS — G834 Cauda equina syndrome: Secondary | ICD-10-CM | POA: Diagnosis not present

## 2022-12-02 DIAGNOSIS — Z466 Encounter for fitting and adjustment of urinary device: Secondary | ICD-10-CM | POA: Diagnosis not present

## 2022-12-02 DIAGNOSIS — G8250 Quadriplegia, unspecified: Secondary | ICD-10-CM | POA: Diagnosis not present

## 2022-12-02 DIAGNOSIS — N390 Urinary tract infection, site not specified: Secondary | ICD-10-CM | POA: Diagnosis not present

## 2022-12-02 DIAGNOSIS — Z993 Dependence on wheelchair: Secondary | ICD-10-CM | POA: Diagnosis not present

## 2022-12-06 DIAGNOSIS — N390 Urinary tract infection, site not specified: Secondary | ICD-10-CM | POA: Diagnosis not present

## 2022-12-06 DIAGNOSIS — G834 Cauda equina syndrome: Secondary | ICD-10-CM | POA: Diagnosis not present

## 2022-12-06 DIAGNOSIS — Z993 Dependence on wheelchair: Secondary | ICD-10-CM | POA: Diagnosis not present

## 2022-12-06 DIAGNOSIS — Z466 Encounter for fitting and adjustment of urinary device: Secondary | ICD-10-CM | POA: Diagnosis not present

## 2022-12-06 DIAGNOSIS — G8250 Quadriplegia, unspecified: Secondary | ICD-10-CM | POA: Diagnosis not present

## 2022-12-10 DIAGNOSIS — Z466 Encounter for fitting and adjustment of urinary device: Secondary | ICD-10-CM | POA: Diagnosis not present

## 2022-12-10 DIAGNOSIS — Z993 Dependence on wheelchair: Secondary | ICD-10-CM | POA: Diagnosis not present

## 2022-12-10 DIAGNOSIS — G8250 Quadriplegia, unspecified: Secondary | ICD-10-CM | POA: Diagnosis not present

## 2022-12-10 DIAGNOSIS — G834 Cauda equina syndrome: Secondary | ICD-10-CM | POA: Diagnosis not present

## 2022-12-10 DIAGNOSIS — N390 Urinary tract infection, site not specified: Secondary | ICD-10-CM | POA: Diagnosis not present

## 2022-12-13 ENCOUNTER — Ambulatory Visit
Admission: RE | Admit: 2022-12-13 | Discharge: 2022-12-13 | Disposition: A | Discharge status: Home / Self Care | Payer: 59 | Source: Ambulatory Visit | Attending: Family Medicine | Admitting: Family Medicine

## 2022-12-13 DIAGNOSIS — Z1231 Encounter for screening mammogram for malignant neoplasm of breast: Secondary | ICD-10-CM | POA: Insufficient documentation

## 2022-12-13 DIAGNOSIS — M810 Age-related osteoporosis without current pathological fracture: Secondary | ICD-10-CM | POA: Diagnosis not present

## 2022-12-13 DIAGNOSIS — R9230 Dense breasts, unspecified: Secondary | ICD-10-CM | POA: Insufficient documentation

## 2022-12-13 DIAGNOSIS — M85832 Other specified disorders of bone density and structure, left forearm: Secondary | ICD-10-CM | POA: Insufficient documentation

## 2022-12-13 DIAGNOSIS — Z0000 Encounter for general adult medical examination without abnormal findings: Secondary | ICD-10-CM | POA: Insufficient documentation

## 2022-12-13 DIAGNOSIS — Z780 Asymptomatic menopausal state: Secondary | ICD-10-CM | POA: Insufficient documentation

## 2022-12-14 ENCOUNTER — Ambulatory Visit: Payer: 59 | Admitting: Family Medicine

## 2022-12-20 DIAGNOSIS — G8250 Quadriplegia, unspecified: Secondary | ICD-10-CM | POA: Diagnosis not present

## 2022-12-20 DIAGNOSIS — R339 Retention of urine, unspecified: Secondary | ICD-10-CM | POA: Diagnosis not present

## 2022-12-20 DIAGNOSIS — Z87440 Personal history of urinary (tract) infections: Secondary | ICD-10-CM | POA: Diagnosis not present

## 2022-12-21 ENCOUNTER — Ambulatory Visit: Payer: 59 | Admitting: Family Medicine

## 2022-12-21 ENCOUNTER — Encounter: Payer: Self-pay | Admitting: Family Medicine

## 2022-12-21 VITALS — BP 97/59 | HR 73 | Temp 98.2°F

## 2022-12-21 DIAGNOSIS — Z993 Dependence on wheelchair: Secondary | ICD-10-CM | POA: Diagnosis not present

## 2022-12-21 DIAGNOSIS — Z2883 Immunization not carried out due to unavailability of vaccine: Secondary | ICD-10-CM | POA: Diagnosis not present

## 2022-12-21 DIAGNOSIS — R32 Unspecified urinary incontinence: Secondary | ICD-10-CM

## 2022-12-21 DIAGNOSIS — Z2989 Encounter for other specified prophylactic measures: Secondary | ICD-10-CM | POA: Insufficient documentation

## 2022-12-21 DIAGNOSIS — F17211 Nicotine dependence, cigarettes, in remission: Secondary | ICD-10-CM

## 2022-12-21 DIAGNOSIS — G8250 Quadriplegia, unspecified: Secondary | ICD-10-CM

## 2022-12-21 DIAGNOSIS — M8000XG Age-related osteoporosis with current pathological fracture, unspecified site, subsequent encounter for fracture with delayed healing: Secondary | ICD-10-CM

## 2022-12-21 DIAGNOSIS — I739 Peripheral vascular disease, unspecified: Secondary | ICD-10-CM | POA: Diagnosis not present

## 2022-12-21 MED ORDER — NITROFURANTOIN MACROCRYSTAL 50 MG PO CAPS: 50.0000 mg | ORAL_CAPSULE | Freq: Two times a day (BID) | ORAL | 3 refills | Status: DC

## 2022-12-21 MED ORDER — RSV MRNA PRE-F VIRUS VACCINE 50 MCG/0.5ML IM SUSY: 1.0000 | PREFILLED_SYRINGE | Freq: Once | INTRAMUSCULAR | 0 refills | Status: AC

## 2022-12-21 MED ORDER — ROSUVASTATIN CALCIUM 5 MG PO TABS: 5.0000 mg | ORAL_TABLET | Freq: Every day | ORAL | 1 refills | Status: DC

## 2022-12-21 NOTE — Assessment & Plan Note (Signed)
Continue foley catheter with replacement every 3 weeks. Continue nitrofurantoin for UTI ppx.

## 2022-12-21 NOTE — Assessment & Plan Note (Signed)
Start rosuvastatin as noted. Recheck lipid panel in 3 months.

## 2022-12-21 NOTE — Progress Notes (Signed)
Established patient visit   Patient: Heidi Hess   DOB: 22-Jan-1961   62 y.o. Female  MRN: [id number] Visit Date: 12/21/2022  Today's healthcare provider: Sherlyn Hay, DO   Chief Complaint  Patient presents with   Follow-up    Medication follow up   Subjective    HPI Last annual exam 08/30/2022 Last mAWV 09/21/2022  Lung cancer screening? Yes Flu vaccine?  Done COVID-vaccine?  Done  Catheter works sometimes but not always  - balloon inflated to 30 mL Home health comes every three weeks to change her catheter Aides come to bathe her and get her dressed every morning. Had tried nitrofurantoin once daily initially but still got infections, so transitioned to twice daily.  Fragility fracture 02/03/2023  Left eye weeping  - has not been used drops lately   Medications: Outpatient Medications Prior to Visit  Medication Sig   Acetaminophen 325 MG CAPS    Ascorbic Acid (VITAMIN C) 500 MG CHEW Chew 500 mg by mouth daily.   baclofen (LIORESAL) 10 MG tablet 1 (one) tablet by mouth three times daily   bisacodyl (DULCOLAX) 10 MG suppository Place 10 mg rectally daily.   Calcium Carb-Cholecalciferol (OYSTER SHELL CALCIUM) 500-400 MG-UNIT TABS    Docusate Sodium (DSS) 250 MG CAPS    loratadine (CLARITIN) 10 MG tablet Take 10 mg by mouth daily.   Melatonin 1 MG CAPS Take 1 capsule by mouth at bedtime as needed.   Multiple Vitamins-Minerals (CENTRUM ADULTS) TABS    Olopatadine HCl 0.2 % SOLN Place 1 drop into both eyes daily at 2 PM.   oxybutynin (DITROPAN XL) 15 MG 24 hr tablet Take 1 tablet (15 mg total) by mouth daily.   Simethicone 180 MG CAPS Take 180 mg by mouth daily.   Vitamin D, Cholecalciferol, 25 MCG (1000 UT) TABS Take 5,000 Units by mouth daily at 2 PM.   [DISCONTINUED] amoxicillin-clavulanate (AUGMENTIN) 875-125 MG tablet Take 1 tablet by mouth every 8 (eight) hours.   [DISCONTINUED] nitrofurantoin (MACRODANTIN) 50 MG capsule TAKE 1 CAPSULE BY MOUTH TWICE A  DAY.   No facility-administered medications prior to visit.    Review of Systems  Constitutional:  Negative for chills and fever.  Eyes:  Positive for discharge (tearing, left eye). Negative for visual disturbance.  Respiratory: Negative.  Negative for cough, shortness of breath and wheezing.   Cardiovascular:  Negative for chest pain, palpitations and leg swelling.  Neurological:  Positive for weakness (baseline). Negative for headaches.        Objective    BP (!) 97/59 (BP Location: Left Arm, Patient Position: Sitting, Cuff Size: Normal)   Pulse 73   Temp 98.2 F (36.8 C) (Oral)   SpO2 98%     Physical Exam Constitutional:      Appearance: Normal appearance.  HENT:     Head: Normocephalic and atraumatic.  Eyes:     General: No scleral icterus.    Extraocular Movements: Extraocular movements intact.     Conjunctiva/sclera: Conjunctivae normal.  Cardiovascular:     Rate and Rhythm: Normal rate and regular rhythm.     Pulses: Normal pulses.     Heart sounds: Normal heart sounds.  Pulmonary:     Effort: Pulmonary effort is normal. No respiratory distress.     Breath sounds: Normal breath sounds.  Musculoskeletal:     Right lower leg: No edema.     Left lower leg: No edema.  Skin:    General:  Skin is warm and dry.  Neurological:     Mental Status: She is alert and oriented to person, place, and time. Mental status is at baseline.  Psychiatric:        Mood and Affect: Mood normal.        Behavior: Behavior normal.      No results found for any visits on 12/21/22.  Assessment & Plan    Age-related osteoporosis with current pathological fracture with delayed healing, subsequent encounter Assessment & Plan: T-score worsened at -3.1.  Orders: -     Ambulatory referral to Endocrinology  PVD (peripheral vascular disease) (HCC) Assessment & Plan: Start rosuvastatin as noted. Recheck lipid panel in 3 months.  Orders: -     Rosuvastatin Calcium; Take 1 tablet  (5 mg total) by mouth daily.  Dispense: 90 tablet; Refill: 1  Incontinence in female Assessment & Plan: Continue foley catheter with replacement every 3 weeks. Continue nitrofurantoin for UTI ppx.   Need for prophylaxis against urinary tract infection -     Nitrofurantoin Macrocrystal; Take 1 capsule (50 mg total) by mouth 2 (two) times daily.  Dispense: 180 capsule; Refill: 3  Cigarette nicotine dependence in remission -     Ambulatory Referral for Lung Cancer Scre  Immunization not administered due to unavailability of vaccine Assessment & Plan: Send patient prescription for RSV vaccine and gave her a printed copy of it.   Wheelchair dependence Assessment & Plan: No acute concerns.  Continue to monitor.  Orders: -     RSV mRNA Pre-F Virus Vaccine; Inject 1 each into the muscle once for 1 dose.  Dispense: 0.5 mL; Refill: 0  Quadriplegia and quadriparesis (HCC) Assessment & Plan: No acute concerns.  Continue to monitor.  Orders: -     RSV mRNA Pre-F Virus Vaccine; Inject 1 each into the muscle once for 1 dose.  Dispense: 0.5 mL; Refill: 0   Return in about 4 months (around 04/23/2023).      I discussed the assessment and treatment plan with the patient  The patient was provided an opportunity to ask questions and all were answered. The patient agreed with the plan and demonstrated an understanding of the instructions.   The patient was advised to call back or seek an in-person evaluation if the symptoms worsen or if the condition fails to improve as anticipated.    Sherlyn Hay, DO  [facility] [phone] (phone) [phone] (fax)  [facility]

## 2022-12-21 NOTE — Assessment & Plan Note (Signed)
No acute concerns.  Continue to monitor.

## 2022-12-21 NOTE — Assessment & Plan Note (Signed)
Send patient prescription for RSV vaccine and gave her a printed copy of it.

## 2022-12-21 NOTE — Assessment & Plan Note (Signed)
T-score worsened at -3.1.

## 2022-12-23 ENCOUNTER — Telehealth: Payer: Self-pay | Admitting: Family Medicine

## 2022-12-23 DIAGNOSIS — Z466 Encounter for fitting and adjustment of urinary device: Secondary | ICD-10-CM | POA: Diagnosis not present

## 2022-12-23 DIAGNOSIS — G8250 Quadriplegia, unspecified: Secondary | ICD-10-CM | POA: Diagnosis not present

## 2022-12-23 DIAGNOSIS — N390 Urinary tract infection, site not specified: Secondary | ICD-10-CM | POA: Diagnosis not present

## 2022-12-23 DIAGNOSIS — G834 Cauda equina syndrome: Secondary | ICD-10-CM | POA: Diagnosis not present

## 2022-12-23 DIAGNOSIS — Z993 Dependence on wheelchair: Secondary | ICD-10-CM | POA: Diagnosis not present

## 2022-12-23 NOTE — Telephone Encounter (Signed)
Copied from CRM [id number]. Topic: Quick Communication - Home Health Verbal Orders >> Dec 23, 2022 10:21 AM Macon Large wrote: Caller/Agency: Elnita Maxwell with [name] Callback Number: [phone] Requesting OT/PT/Skilled Nursing/Social Work/Speech Therapy: skilled nursing Frequency: 1 x every 3 weeks

## 2022-12-27 NOTE — Telephone Encounter (Signed)
Verbal orders given to [facility] with [name].

## 2022-12-30 DIAGNOSIS — Z87440 Personal history of urinary (tract) infections: Secondary | ICD-10-CM | POA: Diagnosis not present

## 2022-12-30 DIAGNOSIS — G8250 Quadriplegia, unspecified: Secondary | ICD-10-CM | POA: Diagnosis not present

## 2022-12-30 DIAGNOSIS — R339 Retention of urine, unspecified: Secondary | ICD-10-CM | POA: Diagnosis not present

## 2023-01-04 DIAGNOSIS — R339 Retention of urine, unspecified: Secondary | ICD-10-CM | POA: Diagnosis not present

## 2023-01-04 DIAGNOSIS — Z87440 Personal history of urinary (tract) infections: Secondary | ICD-10-CM | POA: Diagnosis not present

## 2023-01-04 DIAGNOSIS — G8250 Quadriplegia, unspecified: Secondary | ICD-10-CM | POA: Diagnosis not present

## 2023-01-10 NOTE — Telephone Encounter (Signed)
Copied from CRM [id number]. Topic: Referral - Question >> Jan 07, 2023  2:09 PM Haroldine Laws wrote: Reason for CRM: pt has [employer] dual complete and she thought she had to go to a [facility] office for referrals.  She found out she can go to [facility]. She wants to go to [facility] endocrinology and [facility] for her lung screening.  CB#  [phone]

## 2023-01-11 ENCOUNTER — Encounter: Payer: Self-pay | Admitting: Family Medicine

## 2023-01-11 DIAGNOSIS — N390 Urinary tract infection, site not specified: Secondary | ICD-10-CM | POA: Diagnosis not present

## 2023-01-11 DIAGNOSIS — Z466 Encounter for fitting and adjustment of urinary device: Secondary | ICD-10-CM | POA: Diagnosis not present

## 2023-01-11 DIAGNOSIS — G834 Cauda equina syndrome: Secondary | ICD-10-CM | POA: Diagnosis not present

## 2023-01-11 DIAGNOSIS — G8250 Quadriplegia, unspecified: Secondary | ICD-10-CM | POA: Diagnosis not present

## 2023-01-11 DIAGNOSIS — Z993 Dependence on wheelchair: Secondary | ICD-10-CM | POA: Diagnosis not present

## 2023-01-20 DIAGNOSIS — R339 Retention of urine, unspecified: Secondary | ICD-10-CM | POA: Diagnosis not present

## 2023-01-20 DIAGNOSIS — Z87440 Personal history of urinary (tract) infections: Secondary | ICD-10-CM | POA: Diagnosis not present

## 2023-01-20 DIAGNOSIS — G8250 Quadriplegia, unspecified: Secondary | ICD-10-CM | POA: Diagnosis not present

## 2023-01-21 ENCOUNTER — Other Ambulatory Visit: Payer: Self-pay

## 2023-01-21 DIAGNOSIS — Z122 Encounter for screening for malignant neoplasm of respiratory organs: Secondary | ICD-10-CM

## 2023-01-21 DIAGNOSIS — Z87891 Personal history of nicotine dependence: Secondary | ICD-10-CM

## 2023-01-24 ENCOUNTER — Telehealth: Payer: Self-pay

## 2023-01-24 NOTE — Telephone Encounter (Signed)
Copied from CRM [id number]. Topic: General - Other >> Jan 21, 2023  2:27 PM Franchot Heidelberg wrote: Reason for CRM: Pt called reporting that she needs a prior auth for lung cancer screening, she also states she made need a prior auth for the endocrinology referral her PCP made.   Pt scheduled an appt for lung cancer screening on 01/28/2023.

## 2023-01-25 ENCOUNTER — Telehealth: Payer: Self-pay

## 2023-01-25 NOTE — Telephone Encounter (Signed)
Returned VM. Patient request SDMV and CT be cancelled. Mother is having surgery soon and patient would like to delay program until 03/2023. Reminder set.

## 2023-01-28 ENCOUNTER — Ambulatory Visit: Payer: 59

## 2023-01-31 DIAGNOSIS — R339 Retention of urine, unspecified: Secondary | ICD-10-CM | POA: Diagnosis not present

## 2023-01-31 DIAGNOSIS — Z87440 Personal history of urinary (tract) infections: Secondary | ICD-10-CM | POA: Diagnosis not present

## 2023-01-31 DIAGNOSIS — G8250 Quadriplegia, unspecified: Secondary | ICD-10-CM | POA: Diagnosis not present

## 2023-02-01 DIAGNOSIS — Z466 Encounter for fitting and adjustment of urinary device: Secondary | ICD-10-CM | POA: Diagnosis not present

## 2023-02-01 DIAGNOSIS — G8250 Quadriplegia, unspecified: Secondary | ICD-10-CM | POA: Diagnosis not present

## 2023-02-01 DIAGNOSIS — Z993 Dependence on wheelchair: Secondary | ICD-10-CM | POA: Diagnosis not present

## 2023-02-01 DIAGNOSIS — G834 Cauda equina syndrome: Secondary | ICD-10-CM | POA: Diagnosis not present

## 2023-02-01 DIAGNOSIS — N390 Urinary tract infection, site not specified: Secondary | ICD-10-CM | POA: Diagnosis not present

## 2023-02-07 DIAGNOSIS — G8250 Quadriplegia, unspecified: Secondary | ICD-10-CM | POA: Diagnosis not present

## 2023-02-07 DIAGNOSIS — N390 Urinary tract infection, site not specified: Secondary | ICD-10-CM | POA: Diagnosis not present

## 2023-02-07 DIAGNOSIS — Z993 Dependence on wheelchair: Secondary | ICD-10-CM | POA: Diagnosis not present

## 2023-02-07 DIAGNOSIS — G834 Cauda equina syndrome: Secondary | ICD-10-CM | POA: Diagnosis not present

## 2023-02-07 DIAGNOSIS — Z466 Encounter for fitting and adjustment of urinary device: Secondary | ICD-10-CM | POA: Diagnosis not present

## 2023-02-15 DIAGNOSIS — N390 Urinary tract infection, site not specified: Secondary | ICD-10-CM | POA: Diagnosis not present

## 2023-02-15 DIAGNOSIS — G834 Cauda equina syndrome: Secondary | ICD-10-CM | POA: Diagnosis not present

## 2023-02-15 DIAGNOSIS — Z993 Dependence on wheelchair: Secondary | ICD-10-CM | POA: Diagnosis not present

## 2023-02-15 DIAGNOSIS — Z466 Encounter for fitting and adjustment of urinary device: Secondary | ICD-10-CM | POA: Diagnosis not present

## 2023-02-15 DIAGNOSIS — G8250 Quadriplegia, unspecified: Secondary | ICD-10-CM | POA: Diagnosis not present

## 2023-02-21 DIAGNOSIS — R339 Retention of urine, unspecified: Secondary | ICD-10-CM | POA: Diagnosis not present

## 2023-02-21 DIAGNOSIS — G8250 Quadriplegia, unspecified: Secondary | ICD-10-CM | POA: Diagnosis not present

## 2023-02-21 DIAGNOSIS — Z87440 Personal history of urinary (tract) infections: Secondary | ICD-10-CM | POA: Diagnosis not present

## 2023-02-22 ENCOUNTER — Telehealth: Payer: Self-pay | Admitting: Family Medicine

## 2023-02-22 DIAGNOSIS — N390 Urinary tract infection, site not specified: Secondary | ICD-10-CM | POA: Diagnosis not present

## 2023-02-22 DIAGNOSIS — Z466 Encounter for fitting and adjustment of urinary device: Secondary | ICD-10-CM | POA: Diagnosis not present

## 2023-02-22 DIAGNOSIS — G8250 Quadriplegia, unspecified: Secondary | ICD-10-CM | POA: Diagnosis not present

## 2023-02-22 DIAGNOSIS — Z993 Dependence on wheelchair: Secondary | ICD-10-CM | POA: Diagnosis not present

## 2023-02-22 DIAGNOSIS — G834 Cauda equina syndrome: Secondary | ICD-10-CM | POA: Diagnosis not present

## 2023-02-22 NOTE — Telephone Encounter (Signed)
Home Health Verbal Orders - Caller/Agency: Elnita Maxwell from [facility] Callback Number: [phone] Service Requested: Skilled Nursing Frequency: change catherter every 3 weeks Any new concerns about the patient? Yes Patient has not had a BM in a week. Advised patient to take a laxative today

## 2023-02-23 NOTE — Telephone Encounter (Signed)
Ok to order home health to cath change Q3 weeks

## 2023-02-25 DIAGNOSIS — Z87440 Personal history of urinary (tract) infections: Secondary | ICD-10-CM | POA: Diagnosis not present

## 2023-02-25 DIAGNOSIS — G8250 Quadriplegia, unspecified: Secondary | ICD-10-CM | POA: Diagnosis not present

## 2023-02-25 DIAGNOSIS — R339 Retention of urine, unspecified: Secondary | ICD-10-CM | POA: Diagnosis not present

## 2023-02-25 NOTE — Telephone Encounter (Signed)
Cheryl notified of home health order cath change q3 weeks. Cheryl verbalized appreciation.

## 2023-03-01 DIAGNOSIS — Z87440 Personal history of urinary (tract) infections: Secondary | ICD-10-CM | POA: Diagnosis not present

## 2023-03-01 DIAGNOSIS — R339 Retention of urine, unspecified: Secondary | ICD-10-CM | POA: Diagnosis not present

## 2023-03-01 DIAGNOSIS — G8250 Quadriplegia, unspecified: Secondary | ICD-10-CM | POA: Diagnosis not present

## 2023-03-16 ENCOUNTER — Telehealth: Payer: Self-pay

## 2023-03-16 DIAGNOSIS — Z2989 Encounter for other specified prophylactic measures: Secondary | ICD-10-CM

## 2023-03-16 DIAGNOSIS — R32 Unspecified urinary incontinence: Secondary | ICD-10-CM

## 2023-03-16 DIAGNOSIS — G8250 Quadriplegia, unspecified: Secondary | ICD-10-CM

## 2023-03-16 NOTE — Telephone Encounter (Signed)
 Copied from CRM [id number]. Topic: Referral - Request for Referral >> Mar 16, 2023  9:54 AM Rosaria A wrote: Reason for CRM: Referral to Urology  Did the patient discuss referral with their provider in the last year? Yes    Type of order/referral and detailed reason for visit: Pt is requesting a referral to Urology for a super pubic catheter.  Preference of office, provider, location: Dr. Alm Aran [facility] [address], [place], [place] [address]  [phone]  If referral order, have you been seen by this specialty before? No (If Yes, this issue or another issue? When? Where?  Can we respond through MyChart? No

## 2023-03-22 ENCOUNTER — Telehealth: Payer: Self-pay | Admitting: Family Medicine

## 2023-03-22 DIAGNOSIS — Z993 Dependence on wheelchair: Secondary | ICD-10-CM | POA: Diagnosis not present

## 2023-03-22 DIAGNOSIS — G834 Cauda equina syndrome: Secondary | ICD-10-CM | POA: Diagnosis not present

## 2023-03-22 DIAGNOSIS — Z466 Encounter for fitting and adjustment of urinary device: Secondary | ICD-10-CM | POA: Diagnosis not present

## 2023-03-22 DIAGNOSIS — G8250 Quadriplegia, unspecified: Secondary | ICD-10-CM | POA: Diagnosis not present

## 2023-03-22 DIAGNOSIS — Z87440 Personal history of urinary (tract) infections: Secondary | ICD-10-CM | POA: Diagnosis not present

## 2023-03-22 NOTE — Telephone Encounter (Signed)
 Tiffany from [name] [phone] option 2 called stated there was a missed appt last week and they need orders to go out and change patients catheter.

## 2023-03-22 NOTE — Telephone Encounter (Signed)
 I spoke to [facility] and relayed the verbal okay from Dr. Payton Mccallum to authorize going to change patient's catheter.  Tiffany verbalized understanding and appreciation.

## 2023-03-23 DIAGNOSIS — G834 Cauda equina syndrome: Secondary | ICD-10-CM | POA: Diagnosis not present

## 2023-03-23 DIAGNOSIS — N390 Urinary tract infection, site not specified: Secondary | ICD-10-CM | POA: Diagnosis not present

## 2023-03-23 DIAGNOSIS — Z466 Encounter for fitting and adjustment of urinary device: Secondary | ICD-10-CM | POA: Diagnosis not present

## 2023-03-23 DIAGNOSIS — G8250 Quadriplegia, unspecified: Secondary | ICD-10-CM | POA: Diagnosis not present

## 2023-03-23 DIAGNOSIS — Z993 Dependence on wheelchair: Secondary | ICD-10-CM

## 2023-03-23 DIAGNOSIS — Z87440 Personal history of urinary (tract) infections: Secondary | ICD-10-CM | POA: Diagnosis not present

## 2023-03-24 DIAGNOSIS — R339 Retention of urine, unspecified: Secondary | ICD-10-CM | POA: Diagnosis not present

## 2023-03-24 DIAGNOSIS — G8250 Quadriplegia, unspecified: Secondary | ICD-10-CM | POA: Diagnosis not present

## 2023-03-24 DIAGNOSIS — Z87440 Personal history of urinary (tract) infections: Secondary | ICD-10-CM | POA: Diagnosis not present

## 2023-03-25 ENCOUNTER — Telehealth: Payer: Self-pay | Admitting: Family Medicine

## 2023-03-25 NOTE — Telephone Encounter (Signed)
Home health certification was received via fax 03/23/23 and signed on 03/24/23 faxed off on 03/25/23 VM

## 2023-04-04 DIAGNOSIS — Z87440 Personal history of urinary (tract) infections: Secondary | ICD-10-CM | POA: Diagnosis not present

## 2023-04-04 DIAGNOSIS — G8250 Quadriplegia, unspecified: Secondary | ICD-10-CM | POA: Diagnosis not present

## 2023-04-04 DIAGNOSIS — R339 Retention of urine, unspecified: Secondary | ICD-10-CM | POA: Diagnosis not present

## 2023-04-07 ENCOUNTER — Telehealth: Payer: Self-pay

## 2023-04-07 NOTE — Telephone Encounter (Signed)
Copied from CRM [id number]. Topic: Quick Communication - Home Health Verbal Orders >> Apr 06, 2023  9:04 AM Shon Hale wrote: Caller/Agency: Tiffany - [facility] Callback Number: [phone] option 2 Service Requested: Change catheter  Frequency: 1x for next week  Any new concerns about the patient? No

## 2023-04-08 ENCOUNTER — Encounter: Payer: Self-pay | Admitting: Family Medicine

## 2023-04-08 ENCOUNTER — Ambulatory Visit (INDEPENDENT_AMBULATORY_CARE_PROVIDER_SITE_OTHER): Payer: 59 | Admitting: Acute Care

## 2023-04-08 DIAGNOSIS — Z87891 Personal history of nicotine dependence: Secondary | ICD-10-CM | POA: Diagnosis not present

## 2023-04-08 NOTE — Patient Instructions (Signed)

## 2023-04-08 NOTE — Progress Notes (Addendum)
 Provider Attestation I agree with the documentation of the Shared Decision Making visit,  smoking cessation counseling if appropriate, and verification or eligibility for lung cancer screening as documented by the RN Nurse Navigator.   Raejean Bullock, MSN, AGACNP-BC [facility] See Amion for personal pager PCCM on call pager [phone]    Virtual Visit via Telephone Note  I connected with Heidi Hess on 04/08/23 at  9:00 AM EST by telephone and verified that I am speaking with the correct person using two identifiers.  Location: Patient: Heidi Hess Provider: Alyse Bach, RN   I discussed the limitations, risks, security and privacy concerns of performing an evaluation and management service by telephone and the availability of in person appointments. I also discussed with the patient that there may be a patient responsible charge related to this service. The patient expressed understanding and agreed to proceed.   Shared Decision Making Visit Lung Cancer Screening Program [phone])   Eligibility: Age 63 y.o. Pack Years Smoking History Calculation 36 (# packs/per year x # years smoked) Recent History of coughing up blood  no Unexplained weight loss? no ( >Than 15 pounds within the last 6 months ) Prior History Lung / other cancer no (Diagnosis within the last 5 years already requiring surveillance chest CT Scans). Smoking Status Former Smoker Former Smokers: Years since quit: 14 years  Quit Date: 2010  Visit Components: Discussion included one or more decision making aids. yes Discussion included risk/benefits of screening. yes Discussion included potential follow up diagnostic testing for abnormal scans. yes Discussion included meaning and risk of over diagnosis. yes Discussion included meaning and risk of False Positives. yes Discussion included meaning of total radiation exposure. yes  Counseling Included: Importance of  adherence to annual lung cancer LDCT screening. yes Impact of comorbidities on ability to participate in the program. yes Ability and willingness to under diagnostic treatment. yes  Smoking Cessation Counseling: Current Smokers:  Discussed importance of smoking cessation. yes Information about tobacco cessation classes and interventions provided to patient. yes Patient provided with "ticket" for LDCT Scan. no Symptomatic Patient. no  Counseling(Intermediate counseling: > three minutes) 99406 Diagnosis Code: Tobacco Use Z72.0 Asymptomatic Patient yes  Counseling (Intermediate counseling: > three minutes counseling) O9629 Former Smokers:  Discussed the importance of maintaining cigarette abstinence. yes Diagnosis Code: Personal History of Nicotine Dependence. B28.413 Information about tobacco cessation classes and interventions provided to patient. Yes Patient provided with "ticket" for LDCT Scan. no Written Order for Lung Cancer Screening with LDCT placed in Epic. Yes (CT Chest Lung Cancer Screening Low Dose W/O CM) [id number] Z12.2-Screening of respiratory organs Z87.891-Personal history of nicotine dependence   Alyse Bach, RN

## 2023-04-11 ENCOUNTER — Ambulatory Visit
Admission: RE | Admit: 2023-04-11 | Discharge: 2023-04-11 | Disposition: A | Discharge status: Home / Self Care | Payer: 59 | Source: Ambulatory Visit | Attending: Acute Care | Admitting: Acute Care

## 2023-04-11 DIAGNOSIS — Z122 Encounter for screening for malignant neoplasm of respiratory organs: Secondary | ICD-10-CM | POA: Diagnosis not present

## 2023-04-11 DIAGNOSIS — Z87891 Personal history of nicotine dependence: Secondary | ICD-10-CM | POA: Diagnosis not present

## 2023-04-11 NOTE — Telephone Encounter (Signed)
Verbal order given to Tiffany with [facility]

## 2023-04-13 ENCOUNTER — Other Ambulatory Visit: Payer: Self-pay | Admitting: Family Medicine

## 2023-04-13 ENCOUNTER — Ambulatory Visit: Payer: Self-pay | Admitting: *Deleted

## 2023-04-13 DIAGNOSIS — N760 Acute vaginitis: Secondary | ICD-10-CM

## 2023-04-13 DIAGNOSIS — G8250 Quadriplegia, unspecified: Secondary | ICD-10-CM | POA: Diagnosis not present

## 2023-04-13 DIAGNOSIS — Z466 Encounter for fitting and adjustment of urinary device: Secondary | ICD-10-CM | POA: Diagnosis not present

## 2023-04-13 DIAGNOSIS — Z87440 Personal history of urinary (tract) infections: Secondary | ICD-10-CM | POA: Diagnosis not present

## 2023-04-13 DIAGNOSIS — Z993 Dependence on wheelchair: Secondary | ICD-10-CM | POA: Diagnosis not present

## 2023-04-13 DIAGNOSIS — G834 Cauda equina syndrome: Secondary | ICD-10-CM | POA: Diagnosis not present

## 2023-04-13 MED ORDER — FLUCONAZOLE 150 MG PO TABS: ORAL_TABLET | ORAL | 0 refills | Status: DC

## 2023-04-13 NOTE — Telephone Encounter (Signed)
 Will order Diflucan  one tablet today and second tablet in 72 hours. Recommend pt still schedule an appointment with PCP to have urine tested and to ensure no other causes for symptoms.

## 2023-04-13 NOTE — Telephone Encounter (Signed)
 Summary: Yeast Infection   Heidi Hess with [name] called to let provider know that she changed the patient's urinary catheter this morning and noticed a white, cottage cheese like discharge. Heidi Hess is requesting a anti fungal medication for patient.      Reason for Disposition . Prescription request for new medicine (not a refill)  Answer Assessment - Initial Assessment Questions 1. NAME of MEDICINE: What medicine(s) are you calling about?     Yeast medication requested 2. QUESTION: What is your question? (e.g., double dose of medicine, side effect)     Heidi Hess - [name]: requesting yeast medication for patient 3. PRESCRIBER: Who prescribed the medicine? Reason: if prescribed by specialist, call should be referred to that group.     na 4. SYMPTOMS: Do you have any symptoms? If Yes, ask: What symptoms are you having?  How bad are the symptoms (e.g., mild, moderate, severe)     Reporting: Large amount of white cottage cheese discharge/drainage all over.. Patient unable to feel that area -so unable to feel symptom. No odor reported. Requesting medication for yeast.   Catheter note: Change of catheter today- bag- hematuria- but after new catheter insertion urine output light yellow-looked good. Patient does have urology appointment April.  Protocols used: Medication Question Call-A-AH

## 2023-04-13 NOTE — Telephone Encounter (Signed)
 Medication Refill -  Most Recent Primary Care Visit:  Provider: DONZELLA LAURAINE SAILOR  Department: [id number]  Visit Type: OFFICE VISIT  Date: 12/21/2022  Medication: oxybutynin  (DITROPAN  XL) 15 MG 24 hr tablet   Has the patient contacted their pharmacy? No (Agent: If no, request that the patient contact the pharmacy for the refill. If patient does not wish to contact the pharmacy document the reason why and proceed with request.) (Agent: If yes, when and what did the pharmacy advise?)  Is this the correct pharmacy for this prescription? Yes If no, delete pharmacy and type the correct one.  This is the patient's preferred pharmacy:  [facility] - [place], [place] - [address] [place] [address] Phone: [phone] Fax: [phone]   Has the prescription been filled recently? Yes  Is the patient out of the medication? No  Has the patient been seen for an appointment in the last year OR does the patient have an upcoming appointment? Yes  Can we respond through MyChart? Yes.  Agent: Please be advised that Rx refills may take up to 3 business days. We ask that you follow-up with your pharmacy.

## 2023-04-13 NOTE — Telephone Encounter (Signed)
  Chief Complaint: request yeast medication  Symptoms: Home Health nurse calling to report yeast present- requesting medication  Frequency: noted at visit today Pertinent Negatives: Patient denies symptoms- unable to feel sensation in that area Disposition: [] ED /[] Urgent Care (no appt availability in office) / [] Appointment(In office/virtual)/ []  [facility]/ [] Home Care/ [] Refused Recommended Disposition /[]  Mobile Bus/ [x]  Follow-up with PCP Additional Notes: Medication request- yeast treatment

## 2023-04-14 ENCOUNTER — Ambulatory Visit: Payer: Self-pay

## 2023-04-14 NOTE — Telephone Encounter (Signed)
 Patient needs extra time for transportation. Patient scheduled appointment with Janna Ostwalt  04/21/2023 at 11 am. Per patient she already has a podiatrist and the sooner he can see is on the 04/29/2023. Advised patient to get that appointment so that they can see her toe.

## 2023-04-14 NOTE — Telephone Encounter (Signed)
 Requested medication (s) are due for refill today: yes  Requested medication (s) are on the active medication list: yes  Last refill:  02/23/22  Future visit scheduled: yes  Notes to clinic:  Unable to refill per protocol, cannot delegate.      Requested Prescriptions  Pending Prescriptions Disp Refills   oxybutynin  (DITROPAN  XL) 15 MG 24 hr tablet 90 tablet 3    Sig: Take 1 tablet (15 mg total) by mouth daily.     Urology:  Bladder Agents Passed - 04/14/2023  2:27 PM      Passed - Valid encounter within last 12 months    Recent Outpatient Visits           3 months ago Age-related osteoporosis with current pathological fracture with delayed healing, subsequent encounter   [facility] Pardue, Lauraine SAILOR, DO   7 months ago Annual physical exam   [facility] Pardue, Lauraine SAILOR, DO       Future Appointments             In 2 weeks Pardue, Lauraine SAILOR, DO [facility]

## 2023-04-14 NOTE — Telephone Encounter (Signed)
  Chief Complaint: Blood underneath great toenail. Blood is dark. Leakage around urinary catheter. Stools in brief with each change of brief. Symptoms: above Frequency: see note Pertinent Negatives: Patient denies  Disposition: [] ED /[] Urgent Care (no appt availability in office) / [] Appointment(In office/virtual)/ []  [facility]/ [] Home Care/ [] Refused Recommended Disposition /[]  Mobile Bus/ []  Follow-up with PCP Additional Notes: Pt called to report that she has blood underneath her right great tow nail. Pt states that the blood is dark, like it has been there awhile. Pt is unaware of any pain. Pt also states that the urine is leaking around urinary catheter. She believes that she needs suprapubic catheter placed. Pt also wants provider to know that she is having stools many times a day. With each brief change there is some stool in brief. PT used to have issues having a bm. Pt has not taken any stool softeners or anything else to increase stools. PT would like to be seen in office weds of next week. That is the soonest she would be able to get transportation to the office. Pt could have a VV sooner, however only at certain times when she would have help. No appts available in office. Pt has urology appt April 17th. Please advise.    Reason for Disposition  [1] MODERATE-SEVERE pain AND [2] blood present under the toenail  Answer Assessment - Initial Assessment Questions 1. MECHANISM: How did the injury happen?      Unsure 2. ONSET: When did the injury happen? (Minutes or hours ago)      Unsure - noticed it today 3. LOCATION: What part of the toe is injured? Is the nail damaged?      Great toe of rt foot 4. APPEARANCE of TOE INJURY: What does the injury look like?      Blood under toenail 10. OTHER SYMPTOMS: Do you have any other symptoms?        Urine leaking around urinary catheter. Also having stool in every brief change  Protocols used: Toe  Injury-A-AH

## 2023-04-15 MED ORDER — OXYBUTYNIN CHLORIDE ER 15 MG PO TB24: 15.0000 mg | ORAL_TABLET | Freq: Every day | ORAL | 3 refills | Status: DC

## 2023-04-20 ENCOUNTER — Encounter: Payer: Self-pay | Admitting: Acute Care

## 2023-04-20 DIAGNOSIS — Z87891 Personal history of nicotine dependence: Secondary | ICD-10-CM

## 2023-04-20 DIAGNOSIS — Z122 Encounter for screening for malignant neoplasm of respiratory organs: Secondary | ICD-10-CM

## 2023-04-21 ENCOUNTER — Ambulatory Visit (INDEPENDENT_AMBULATORY_CARE_PROVIDER_SITE_OTHER): Payer: 59 | Admitting: Physician Assistant

## 2023-04-21 VITALS — BP 122/48 | HR 73 | Resp 18 | Ht 65.0 in | Wt 165.0 lb

## 2023-04-21 DIAGNOSIS — E7849 Other hyperlipidemia: Secondary | ICD-10-CM

## 2023-04-21 DIAGNOSIS — I739 Peripheral vascular disease, unspecified: Secondary | ICD-10-CM

## 2023-04-21 DIAGNOSIS — R197 Diarrhea, unspecified: Secondary | ICD-10-CM | POA: Diagnosis not present

## 2023-04-21 DIAGNOSIS — L03818 Cellulitis of other sites: Secondary | ICD-10-CM

## 2023-04-21 DIAGNOSIS — R32 Unspecified urinary incontinence: Secondary | ICD-10-CM

## 2023-04-21 DIAGNOSIS — M810 Age-related osteoporosis without current pathological fracture: Secondary | ICD-10-CM | POA: Diagnosis not present

## 2023-04-21 MED ORDER — DOXYCYCLINE HYCLATE 100 MG PO TABS: 100.0000 mg | ORAL_TABLET | Freq: Two times a day (BID) | ORAL | 0 refills | Status: DC

## 2023-04-21 MED ORDER — COLESTIPOL HCL 1 G PO TABS: 1.0000 g | ORAL_TABLET | Freq: Two times a day (BID) | ORAL | 0 refills | Status: DC

## 2023-04-21 MED ORDER — ROSUVASTATIN CALCIUM 5 MG PO TABS: 5.0000 mg | ORAL_TABLET | Freq: Every day | ORAL | 1 refills | Status: DC

## 2023-04-21 MED ORDER — OXYBUTYNIN CHLORIDE ER 15 MG PO TB24: 15.0000 mg | ORAL_TABLET | Freq: Every day | ORAL | 3 refills | Status: DC

## 2023-04-21 NOTE — Progress Notes (Unsigned)
Established patient visit  Patient: Heidi Hess   DOB: 02/06/1961   63 y.o. Female  MRN: [id number] Visit Date: 04/21/2023  Today's healthcare provider: Debera Lat, PA-C   Chief Complaint  Patient presents with   Nail Problem    Right foot, great toe, toenail came off last week, soaked in epsom salts    Urinary Catheter    Patient feels like her catheter isn't working well any longer and she thinks she needs a suprapubic catheter; has appointment in [facility] with Urology but would like to see if she can be closer to this area   stool changes    She reports she has never had daily stools, but now she is having increased bowel movements daily   Subjective         Discussed the use of AI scribe software for clinical note transcription with the patient, who gave verbal consent to proceed.  History of Present Illness               12/21/2022    1:11 PM 09/21/2022   11:46 AM 08/30/2022    1:22 PM  Depression screen PHQ 2/9  Decreased Interest 0 0 0  Down, Depressed, Hopeless 0 0 0  PHQ - 2 Score 0 0 0  Altered sleeping   2  Tired, decreased energy   0  Change in appetite   0  Feeling bad or failure about yourself    0  Trouble concentrating   0  Moving slowly or fidgety/restless   0  Suicidal thoughts   0  PHQ-9 Score   2  Difficult doing work/chores  Not difficult at all Not difficult at all       No data to display          Medications: Outpatient Medications Prior to Visit  Medication Sig   Acetaminophen 325 MG CAPS    Ascorbic Acid (VITAMIN C) 500 MG CHEW Chew 500 mg by mouth daily.   baclofen (LIORESAL) 10 MG tablet 1 (one) tablet by mouth three times daily   bisacodyl (DULCOLAX) 10 MG suppository Place 10 mg rectally daily.   Calcium Carb-Cholecalciferol (OYSTER SHELL CALCIUM) 500-400 MG-UNIT TABS    Docusate Sodium (DSS) 250 MG CAPS    fluconazole (DIFLUCAN) 150 MG tablet Take one tablet today and second tablet in 72 hours.   loratadine  (CLARITIN) 10 MG tablet Take 10 mg by mouth daily.   Melatonin 1 MG CAPS Take 1 capsule by mouth at bedtime as needed.   Multiple Vitamins-Minerals (CENTRUM ADULTS) TABS    nitrofurantoin (MACRODANTIN) 50 MG capsule Take 1 capsule (50 mg total) by mouth 2 (two) times daily.   Olopatadine HCl 0.2 % SOLN Place 1 drop into both eyes daily at 2 PM.   Simethicone 180 MG CAPS Take 180 mg by mouth daily.   Vitamin D, Cholecalciferol, 25 MCG (1000 UT) TABS Take 5,000 Units by mouth daily at 2 PM.   [DISCONTINUED] oxybutynin (DITROPAN XL) 15 MG 24 hr tablet Take 1 tablet (15 mg total) by mouth daily.   [DISCONTINUED] rosuvastatin (CRESTOR) 5 MG tablet Take 1 tablet (5 mg total) by mouth daily.   No facility-administered medications prior to visit.    Review of Systems All negative Except see HPI   {Insert previous labs (optional):23779} {See past labs  Heme  Chem  Endocrine  Serology  Results Review (optional):1}   Objective    BP (!) 122/48   Pulse 73  Resp 18   Ht 5\' 5"  (1.651 m)   Wt 165 lb (74.8 kg) Comment: unable to stand for weight  SpO2 99%   BMI 27.46 kg/m  {Insert last BP/Wt (optional):23777}{See vitals history (optional):1}   Physical Exam   No results found for any visits on 04/21/23.      Assessment and Plan             No orders of the defined types were placed in this encounter.   Return for chronic disease f/u.   The patient was advised to call back or seek an in-person evaluation if the symptoms worsen or if the condition fails to improve as anticipated.  I discussed the assessment and treatment plan with the patient. The patient was provided an opportunity to ask questions and all were answered. The patient agreed with the plan and demonstrated an understanding of the instructions.  I, Debera Lat, PA-C have reviewed all documentation for this visit. The documentation on 04/21/2023  for the exam, diagnosis, procedures, and orders are all accurate  and complete.  Debera Lat, [facility], MMS [facility] [phone] (phone) [phone] (fax)  [facility]

## 2023-04-22 ENCOUNTER — Encounter: Payer: Self-pay | Admitting: Physician Assistant

## 2023-04-25 DIAGNOSIS — G834 Cauda equina syndrome: Secondary | ICD-10-CM | POA: Diagnosis not present

## 2023-04-25 DIAGNOSIS — Z87440 Personal history of urinary (tract) infections: Secondary | ICD-10-CM | POA: Diagnosis not present

## 2023-04-25 DIAGNOSIS — G8250 Quadriplegia, unspecified: Secondary | ICD-10-CM | POA: Diagnosis not present

## 2023-04-25 DIAGNOSIS — Z466 Encounter for fitting and adjustment of urinary device: Secondary | ICD-10-CM | POA: Diagnosis not present

## 2023-04-25 DIAGNOSIS — Z993 Dependence on wheelchair: Secondary | ICD-10-CM | POA: Diagnosis not present

## 2023-04-25 DIAGNOSIS — R339 Retention of urine, unspecified: Secondary | ICD-10-CM | POA: Diagnosis not present

## 2023-04-26 ENCOUNTER — Ambulatory Visit: Payer: 59 | Admitting: Family Medicine

## 2023-04-29 ENCOUNTER — Ambulatory Visit: Payer: 59 | Admitting: Podiatry

## 2023-05-02 ENCOUNTER — Telehealth: Payer: Self-pay | Admitting: Family Medicine

## 2023-05-02 NOTE — Telephone Encounter (Signed)
 Certification Forms were received on 05/02/2023 & placed in PCP mailbox for completion. Please allow 7-10 buisness days to complete. Thank you

## 2023-05-03 ENCOUNTER — Ambulatory Visit: Payer: Self-pay | Admitting: Family Medicine

## 2023-05-03 ENCOUNTER — Ambulatory Visit (INDEPENDENT_AMBULATORY_CARE_PROVIDER_SITE_OTHER): Payer: 59 | Admitting: Podiatry

## 2023-05-03 ENCOUNTER — Ambulatory Visit: Payer: 59 | Admitting: Podiatry

## 2023-05-03 ENCOUNTER — Encounter: Payer: Self-pay | Admitting: Podiatry

## 2023-05-03 DIAGNOSIS — B351 Tinea unguium: Secondary | ICD-10-CM

## 2023-05-03 DIAGNOSIS — G834 Cauda equina syndrome: Secondary | ICD-10-CM | POA: Diagnosis not present

## 2023-05-03 DIAGNOSIS — Z87440 Personal history of urinary (tract) infections: Secondary | ICD-10-CM | POA: Diagnosis not present

## 2023-05-03 DIAGNOSIS — M79675 Pain in left toe(s): Secondary | ICD-10-CM | POA: Diagnosis not present

## 2023-05-03 DIAGNOSIS — Z993 Dependence on wheelchair: Secondary | ICD-10-CM | POA: Diagnosis not present

## 2023-05-03 DIAGNOSIS — M79674 Pain in right toe(s): Secondary | ICD-10-CM | POA: Diagnosis not present

## 2023-05-03 DIAGNOSIS — Z466 Encounter for fitting and adjustment of urinary device: Secondary | ICD-10-CM | POA: Diagnosis not present

## 2023-05-03 DIAGNOSIS — G8250 Quadriplegia, unspecified: Secondary | ICD-10-CM | POA: Diagnosis not present

## 2023-05-03 NOTE — Progress Notes (Signed)
   Chief Complaint  Patient presents with   Nail Problem    "I need my toenails cut.  My big toenail fell off.  I been putting Neosporin on it." N - toenails L - 1-5 bilateral O - gradually C - thick, discolored, long A - none T - none    SUBJECTIVE Patient PmHx quadriplegia nonambulatory in a wheelchair presents to office today complaining of elongated, thickened nails that cause pain while ambulating in shoes.  Patient is unable to trim their own nails. Patient is here for further evaluation and treatment.  Past Medical History:  Diagnosis Date   Allergy    Cancer [facility])    endometrial   Endometrial cancer (HCC) 01/25/2018   Formatting of this note might be different from the original.  EMB 11/19 FIGO Grade 1  Formatting of this note might be different from the original.  Formatting of this note might be different from the original.  EMB 11/19 FIGO Grade 1     Osteoporosis    Quadriplegia (HCC)    C-6    Allergies  Allergen Reactions   Latex Rash   Nickel Dermatitis, Itching and Rash   Sulfa Antibiotics Itching, Nausea Only and Rash     OBJECTIVE General Patient is awake, alert, and oriented x 3 and in no acute distress. Derm Skin is dry and supple bilateral. Negative open lesions or macerations. Remaining integument unremarkable. Nails are tender, long, thickened and dystrophic with subungual debris, consistent with onychomycosis, 1-5 bilateral. No signs of infection noted. Vasc  DP and PT pedal pulses palpable bilaterally. Temperature gradient within normal limits.  Neuro light touch and protective threshold sensation diminished bilateral Musculoskeletal Exam patient is nonambulatory in a wheelchair  ASSESSMENT 1.  Pain due to onychomycosis of toenails both  PLAN OF CARE 1. Patient evaluated today.  2. Instructed to maintain good pedal hygiene and foot care.  3. Mechanical debridement of nails 1-5 bilaterally performed using a nail nipper. Filed with dremel without  incident.  4. Return to clinic as needed   Felecia Shelling, DPM [facility]  Dr. Felecia Shelling, DPM    [address] [place], [place] [address]                Office [phone]  Fax [phone]

## 2023-05-04 DIAGNOSIS — G8250 Quadriplegia, unspecified: Secondary | ICD-10-CM | POA: Diagnosis not present

## 2023-05-04 DIAGNOSIS — Z87440 Personal history of urinary (tract) infections: Secondary | ICD-10-CM | POA: Diagnosis not present

## 2023-05-04 DIAGNOSIS — Z466 Encounter for fitting and adjustment of urinary device: Secondary | ICD-10-CM | POA: Diagnosis not present

## 2023-05-04 DIAGNOSIS — G834 Cauda equina syndrome: Secondary | ICD-10-CM | POA: Diagnosis not present

## 2023-05-04 DIAGNOSIS — Z993 Dependence on wheelchair: Secondary | ICD-10-CM | POA: Diagnosis not present

## 2023-05-05 DIAGNOSIS — Z87440 Personal history of urinary (tract) infections: Secondary | ICD-10-CM | POA: Diagnosis not present

## 2023-05-05 DIAGNOSIS — G8250 Quadriplegia, unspecified: Secondary | ICD-10-CM | POA: Diagnosis not present

## 2023-05-05 DIAGNOSIS — R339 Retention of urine, unspecified: Secondary | ICD-10-CM | POA: Diagnosis not present

## 2023-05-06 DIAGNOSIS — R339 Retention of urine, unspecified: Secondary | ICD-10-CM | POA: Diagnosis not present

## 2023-05-06 DIAGNOSIS — Z87440 Personal history of urinary (tract) infections: Secondary | ICD-10-CM | POA: Diagnosis not present

## 2023-05-06 DIAGNOSIS — G8250 Quadriplegia, unspecified: Secondary | ICD-10-CM | POA: Diagnosis not present

## 2023-05-18 DIAGNOSIS — M810 Age-related osteoporosis without current pathological fracture: Secondary | ICD-10-CM | POA: Diagnosis not present

## 2023-05-20 ENCOUNTER — Encounter: Payer: Self-pay | Admitting: Family Medicine

## 2023-05-20 ENCOUNTER — Ambulatory Visit (INDEPENDENT_AMBULATORY_CARE_PROVIDER_SITE_OTHER): Payer: 59 | Admitting: Family Medicine

## 2023-05-20 VITALS — BP 78/48 | HR 75 | Ht 65.0 in | Wt 165.0 lb

## 2023-05-20 DIAGNOSIS — R110 Nausea: Secondary | ICD-10-CM

## 2023-05-20 DIAGNOSIS — M8000XG Age-related osteoporosis with current pathological fracture, unspecified site, subsequent encounter for fracture with delayed healing: Secondary | ICD-10-CM | POA: Diagnosis not present

## 2023-05-20 DIAGNOSIS — I739 Peripheral vascular disease, unspecified: Secondary | ICD-10-CM | POA: Diagnosis not present

## 2023-05-20 DIAGNOSIS — R197 Diarrhea, unspecified: Secondary | ICD-10-CM

## 2023-05-20 DIAGNOSIS — R32 Unspecified urinary incontinence: Secondary | ICD-10-CM | POA: Diagnosis not present

## 2023-05-20 DIAGNOSIS — E7849 Other hyperlipidemia: Secondary | ICD-10-CM

## 2023-05-20 DIAGNOSIS — J189 Pneumonia, unspecified organism: Secondary | ICD-10-CM

## 2023-05-20 LAB — CBC WITH DIFFERENTIAL/PLATELET
Basophils Absolute: 0 10*3/uL (ref 0.0–0.2)
Basos: 0 %
EOS (ABSOLUTE): 0.1 10*3/uL (ref 0.0–0.4)
Eos: 2 %
Hematocrit: 37 % (ref 34.0–46.6)
Hemoglobin: 11.7 g/dL (ref 11.1–15.9)
Immature Grans (Abs): 0 10*3/uL (ref 0.0–0.1)
Immature Granulocytes: 0 %
Lymphocytes Absolute: 1.8 10*3/uL (ref 0.7–3.1)
Lymphs: 35 %
MCH: 28.7 pg (ref 26.6–33.0)
MCHC: 31.6 g/dL (ref 31.5–35.7)
MCV: 91 fL (ref 79–97)
Monocytes Absolute: 0.6 10*3/uL (ref 0.1–0.9)
Monocytes: 11 %
Neutrophils Absolute: 2.6 10*3/uL (ref 1.4–7.0)
Neutrophils: 52 %
Platelets: 227 10*3/uL (ref 150–450)
RBC: 4.08 x10E6/uL (ref 3.77–5.28)
RDW: 13.7 % (ref 11.7–15.4)
WBC: 5.1 10*3/uL (ref 3.4–10.8)

## 2023-05-20 LAB — COMPREHENSIVE METABOLIC PANEL
ALT: 18 IU/L (ref 0–32)
AST: 18 IU/L (ref 0–40)
Albumin: 3.5 g/dL — ABNORMAL LOW (ref 3.9–4.9)
Alkaline Phosphatase: 137 IU/L — ABNORMAL HIGH (ref 44–121)
BUN/Creatinine Ratio: 20 (ref 12–28)
BUN: 9 mg/dL (ref 8–27)
Bilirubin Total: 0.2 mg/dL (ref 0.0–1.2)
CO2: 23 mmol/L (ref 20–29)
Calcium: 7.6 mg/dL — ABNORMAL LOW (ref 8.7–10.3)
Chloride: 104 mmol/L (ref 96–106)
Creatinine, Ser: 0.46 mg/dL — ABNORMAL LOW (ref 0.57–1.00)
Globulin, Total: 2.8 g/dL (ref 1.5–4.5)
Glucose: 102 mg/dL — ABNORMAL HIGH (ref 70–99)
Potassium: 3.7 mmol/L (ref 3.5–5.2)
Sodium: 137 mmol/L (ref 134–144)
Total Protein: 6.3 g/dL (ref 6.0–8.5)
eGFR: 107 mL/min/{1.73_m2} (ref >59)

## 2023-05-20 MED ORDER — ROSUVASTATIN CALCIUM 5 MG PO TABS: 5.0000 mg | ORAL_TABLET | Freq: Every day | ORAL | 1 refills | Status: AC

## 2023-05-20 MED ORDER — SACCHAROMYCES BOULARDII 250 MG PO CAPS: 250.0000 mg | ORAL_CAPSULE | Freq: Two times a day (BID) | ORAL | 5 refills | Status: AC

## 2023-05-20 MED ORDER — OXYBUTYNIN CHLORIDE ER 15 MG PO TB24
15.0000 mg | ORAL_TABLET | Freq: Every day | ORAL | 3 refills | Status: AC
Start: 2023-05-20 — End: ?

## 2023-05-20 MED ORDER — AZITHROMYCIN 250 MG PO TABS: ORAL_TABLET | ORAL | 0 refills | Status: AC

## 2023-05-20 MED ORDER — BACLOFEN 10 MG PO TABS: 10.0000 mg | ORAL_TABLET | Freq: Three times a day (TID) | ORAL | 3 refills | Status: DC

## 2023-05-20 MED ORDER — AMOXICILLIN-POT CLAVULANATE 875-125 MG PO TABS: 1.0000 | ORAL_TABLET | Freq: Two times a day (BID) | ORAL | 0 refills | Status: DC

## 2023-05-20 MED ORDER — COLESTIPOL HCL 1 G PO TABS: 1.0000 g | ORAL_TABLET | Freq: Two times a day (BID) | ORAL | 0 refills | Status: DC

## 2023-05-20 MED ORDER — METRONIDAZOLE 500 MG PO TABS: 500.0000 mg | ORAL_TABLET | Freq: Two times a day (BID) | ORAL | 0 refills | Status: DC

## 2023-05-20 NOTE — Assessment & Plan Note (Signed)
 Chronic diarrhea since November, characterized by runny and soft stools. No previous history of Clostridium difficile infection. Differential diagnosis includes infection, medication side effect, or other gastrointestinal disorder. Stool sample collection discussed for infection testing. - Order stool test for infection - Consider gastroenterology referral for further evaluation - Initiate metronidazole for diarrhea - Coordinate with home health services for stool sample collection

## 2023-05-20 NOTE — Assessment & Plan Note (Addendum)
 Persistent cough since last visit, with headache, nausea, and deep cough. Differential includes respiratory infection or post-infectious cough. No chest pain. However, low blood pressure present with chills last night. Low blood pressure possibly related to recent zoledronic acid infusion. Concerns about sepsis due to low blood pressure and chills, but no fever or tachycardia and patient reports feeling generally overall.  She is not willing to proceed to the ER for further evaluation and treatment. - Prescribe Augmentin and azithromycin for suspected pneumonia - Advise low threshold for ER visit if symptoms worsen, including but not limited to further fever, chills, dizziness or lightheadedness. - Coordinate with home health for close follow up during the next 1-2 weeks.

## 2023-05-20 NOTE — Progress Notes (Signed)
 Established patient visit   Patient: Heidi Hess   DOB: May 01, 1960   63 y.o. Female  MRN: [id number] Visit Date: 05/20/2023  Today's healthcare provider: Sherlyn Hay, DO   Chief Complaint  Patient presents with   Nail Problem    The toenail on her R foot it doing better, she has been evaluated by podiatrist no concerns     Subjective    HPI Heidi Hess is a 63 year old female who presents with persistent diarrhea and associated symptoms since November.  She has been experiencing persistent diarrhea since November, characterized by runny and sometimes soft stools with a strong odor on some days. The diarrhea worsens with eating and has not responded to treatment with cilostazol, taken as half a tablet twice daily. She has a history of constipation for 45 years prior to the onset of diarrhea and was previously referred for a colonoscopy but did not attend the appointment.  She has had a persistent cough since her last visit in February, which is deep and difficult to expectorate. The cough was severe enough to keep her awake at night, although it has recently improved. Associated symptoms include headache, nausea, and dizziness, which have been intermittent. She experienced chills and a headache following a recent infusion for her bones, which she believes may be related to the medication.  She has been experiencing nausea and a lack of appetite, which has improved recently. During the period of illness, she was barely eating enough to get by. No vomiting, but nausea prevented her from eating.  She reports dizziness and lightheadedness, which occurred more frequently when she was sick. She has experienced headaches intermittently, with the most recent one occurring this morning, possibly related to her recent infusion.  She reports issues with her catheter not functioning properly since November, with urine leaking around the catheter rather than into the bag. She has an  upcoming appointment with urology in April.  She has a history of a toenail issue for which she was prescribed doxycycline, which she completed for ten days. The toenail issue has since improved after seeing a podiatrist.     Medications: Outpatient Medications Prior to Visit  Medication Sig   Acetaminophen 325 MG CAPS    Ascorbic Acid (VITAMIN C) 500 MG CHEW Chew 500 mg by mouth daily.   bisacodyl (DULCOLAX) 10 MG suppository Place 10 mg rectally daily.   Calcium Carb-Cholecalciferol (OYSTER SHELL CALCIUM) 500-400 MG-UNIT TABS    colestipol (COLESTID) 1 g tablet Take 1 tablet (1 g total) by mouth 2 (two) times daily.   Docusate Sodium (DSS) 250 MG CAPS    loratadine (CLARITIN) 10 MG tablet Take 10 mg by mouth daily.   Melatonin 1 MG CAPS Take 1 capsule by mouth at bedtime as needed.   Multiple Vitamins-Minerals (CENTRUM ADULTS) TABS    Olopatadine HCl 0.2 % SOLN Place 1 drop into both eyes daily at 2 PM.   Simethicone 180 MG CAPS Take 180 mg by mouth daily.   Vitamin D, Cholecalciferol, 25 MCG (1000 UT) TABS Take 5,000 Units by mouth daily at 2 PM.   [DISCONTINUED] baclofen (LIORESAL) 10 MG tablet 1 (one) tablet by mouth three times daily   [DISCONTINUED] doxycycline (VIBRA-TABS) 100 MG tablet Take 1 tablet (100 mg total) by mouth 2 (two) times daily.   [DISCONTINUED] fluconazole (DIFLUCAN) 150 MG tablet Take one tablet today and second tablet in 72 hours.   [DISCONTINUED] nitrofurantoin (MACRODANTIN) 50 MG  capsule Take 1 capsule (50 mg total) by mouth 2 (two) times daily.   [DISCONTINUED] oxybutynin (DITROPAN XL) 15 MG 24 hr tablet Take 1 tablet (15 mg total) by mouth daily.   [DISCONTINUED] rosuvastatin (CRESTOR) 5 MG tablet Take 1 tablet (5 mg total) by mouth daily.   No facility-administered medications prior to visit.    Review of Systems  Constitutional:  Negative for appetite change, chills, fatigue and fever.  Respiratory:  Positive for cough. Negative for chest tightness  and shortness of breath.   Cardiovascular:  Negative for chest pain and palpitations.  Gastrointestinal:  Positive for abdominal pain, diarrhea and nausea. Negative for vomiting.  Neurological:  Positive for dizziness (intermittent), light-headedness and headaches. Negative for weakness.        Objective    BP (!) 78/48   Pulse 75   Ht 5\' 5"  (1.651 m)   Wt 165 lb (74.8 kg)   PF 98 L/min   BMI 27.46 kg/m     Physical Exam Constitutional:      Appearance: Normal appearance.  HENT:     Head: Normocephalic and atraumatic.  Eyes:     General: No scleral icterus.    Extraocular Movements: Extraocular movements intact.     Conjunctiva/sclera: Conjunctivae normal.  Cardiovascular:     Rate and Rhythm: Normal rate and regular rhythm.     Pulses: Normal pulses.     Heart sounds: Normal heart sounds.     Comments: +mild hypotension Pulmonary:     Effort: Pulmonary effort is normal. No respiratory distress.     Breath sounds: Rhonchi present.     Comments: +congested/nonproductive cough Abdominal:     General: Bowel sounds are normal.     Palpations: Abdomen is soft.  Musculoskeletal:     Right lower leg: No edema.     Left lower leg: No edema.  Skin:    General: Skin is warm and dry.  Neurological:     Mental Status: She is alert and oriented to person, place, and time. Mental status is at baseline.  Psychiatric:        Mood and Affect: Mood normal.        Behavior: Behavior normal.      No results found for any visits on 05/20/23.  Assessment & Plan    Nausea -     Ambulatory referral to Gastroenterology  Incontinence in female -     oxyBUTYnin Chloride ER; Take 1 tablet (15 mg total) by mouth daily.  Dispense: 90 tablet; Refill: 3  PVD (peripheral vascular disease) (HCC) -     Rosuvastatin Calcium; Take 1 tablet (5 mg total) by mouth daily.  Dispense: 90 tablet; Refill: 1  Other hyperlipidemia -     Rosuvastatin Calcium; Take 1 tablet (5 mg total) by mouth  daily.  Dispense: 90 tablet; Refill: 1 -     Baclofen; Take 1 tablet (10 mg total) by mouth 3 (three) times daily.  Dispense: 90 tablet; Refill: 3 -     Lipid panel  Diarrhea, unspecified type -     Ambulatory referral to Home Health  Diarrhea of presumed infectious origin Assessment & Plan: Chronic diarrhea since November, characterized by runny and soft stools. No previous history of Clostridium difficile infection. Differential diagnosis includes infection, medication side effect, or other gastrointestinal disorder. Stool sample collection discussed for infection testing. - Order stool test for infection - Consider gastroenterology referral for further evaluation - Initiate metronidazole for diarrhea - Coordinate with  home health services for stool sample collection  Orders: -     Ambulatory referral to Gastroenterology -     Ova and parasite examination -     Clostridium Difficile by PCR -     CBC with Differential/Platelet -     Comprehensive metabolic panel -     metroNIDAZOLE; Take 1 tablet (500 mg total) by mouth 2 (two) times daily.  Dispense: 14 tablet; Refill: 0 -     Ambulatory referral to Home Health -     Saccharomyces boulardii; Take 1 capsule (250 mg total) by mouth 2 (two) times daily. Take at least two hours before or after antibiotics.  Dispense: 60 capsule; Refill: 5  Community acquired pneumonia, unspecified laterality Assessment & Plan: Persistent cough since last visit, with headache, nausea, and deep cough. Differential includes respiratory infection or post-infectious cough. No chest pain. However, low blood pressure present with chills last night. Low blood pressure possibly related to recent zoledronic acid infusion. Concerns about sepsis due to low blood pressure and chills, but no fever or tachycardia and patient reports feeling generally overall.  She is not willing to proceed to the ER for further evaluation and treatment. - Prescribe Augmentin and  azithromycin for suspected pneumonia - Advise low threshold for ER visit if symptoms worsen, including but not limited to further fever, chills, dizziness or lightheadedness. - Coordinate with home health for close follow up during the next 1-2 weeks.  Orders: -     Amoxicillin-Pot Clavulanate; Take 1 tablet by mouth 2 (two) times daily.  Dispense: 20 tablet; Refill: 0 -     Azithromycin; Take 2 tablets on day 1, then 1 tablet daily on days 2 through 5  Dispense: 6 tablet; Refill: 0 -     Ambulatory referral to Home Health  Age-related osteoporosis with current pathological fracture with delayed healing, subsequent encounter Assessment & Plan: Current symptoms (headache, low blood pressure, chills) may be side effect of zolendronic acid administration on Wednesday. However, given her overall clinical picture, will be treating her as noted above.   Urinary Catheter Dysfunction Urinary catheter dysfunction since November, with urine bypassing the catheter and blood clots in urine. Awaiting urology appointment in April. - Maintain current catheter management until urology appointment  Recent Zoledronic Acid Infusion Recent zoledronic acid infusion with side effects of chills, headache, and low blood pressure, consistent with known side effects. - Monitor symptoms and manage supportively - Advise seeking medical attention if symptoms worsen  Follow-up Limited transportation options impacting visit frequency. - Schedule follow-up appointment in three months unless symptoms worsen - Coordinate with home health services for additional support as needed - Low threshold to proceed to ER if symptoms persist/do not improve.   Return in about 3 months (around 08/20/2023) for chronic f/u.      I discussed the assessment and treatment plan with the patient  The patient was provided an opportunity to ask questions and all were answered. The patient agreed with the plan and demonstrated an  understanding of the instructions.   The patient was advised to call back or seek an in-person evaluation if the symptoms worsen or if the condition fails to improve as anticipated.    Sherlyn Hay, DO  [facility] [phone] (phone) [phone] (fax)  [facility]

## 2023-05-20 NOTE — Assessment & Plan Note (Signed)
 Current symptoms (headache, low blood pressure, chills) may be side effect of zolendronic acid administration on Wednesday. However, given her overall clinical picture, will be treating her as noted above.

## 2023-05-23 ENCOUNTER — Encounter: Payer: Self-pay | Admitting: Family Medicine

## 2023-05-26 ENCOUNTER — Telehealth: Payer: Self-pay

## 2023-05-26 DIAGNOSIS — Z993 Dependence on wheelchair: Secondary | ICD-10-CM | POA: Diagnosis not present

## 2023-05-26 DIAGNOSIS — Z87440 Personal history of urinary (tract) infections: Secondary | ICD-10-CM | POA: Diagnosis not present

## 2023-05-26 DIAGNOSIS — Z466 Encounter for fitting and adjustment of urinary device: Secondary | ICD-10-CM | POA: Diagnosis not present

## 2023-05-26 DIAGNOSIS — G834 Cauda equina syndrome: Secondary | ICD-10-CM | POA: Diagnosis not present

## 2023-05-26 DIAGNOSIS — G8250 Quadriplegia, unspecified: Secondary | ICD-10-CM | POA: Diagnosis not present

## 2023-05-26 NOTE — Telephone Encounter (Signed)
 Lab orders faxed as requested 05/26/23. cmb

## 2023-05-26 NOTE — Telephone Encounter (Signed)
 Copied from CRM [id number]. Topic: Clinical - Lab/Test Results >> May 26, 2023 10:54 AM Heidi Hess wrote: eason for CRM: Arline Asp from [employer] she dd not receive the lab orders and would like verbal orders.

## 2023-05-27 DIAGNOSIS — E785 Hyperlipidemia, unspecified: Secondary | ICD-10-CM | POA: Diagnosis not present

## 2023-05-27 DIAGNOSIS — Z993 Dependence on wheelchair: Secondary | ICD-10-CM | POA: Diagnosis not present

## 2023-05-27 DIAGNOSIS — G8250 Quadriplegia, unspecified: Secondary | ICD-10-CM | POA: Diagnosis not present

## 2023-05-27 DIAGNOSIS — Z87440 Personal history of urinary (tract) infections: Secondary | ICD-10-CM | POA: Diagnosis not present

## 2023-05-27 DIAGNOSIS — R197 Diarrhea, unspecified: Secondary | ICD-10-CM | POA: Diagnosis not present

## 2023-05-27 DIAGNOSIS — Z466 Encounter for fitting and adjustment of urinary device: Secondary | ICD-10-CM | POA: Diagnosis not present

## 2023-05-27 DIAGNOSIS — G834 Cauda equina syndrome: Secondary | ICD-10-CM | POA: Diagnosis not present

## 2023-06-02 ENCOUNTER — Encounter: Payer: Self-pay | Admitting: Family Medicine

## 2023-06-02 DIAGNOSIS — R339 Retention of urine, unspecified: Secondary | ICD-10-CM | POA: Diagnosis not present

## 2023-06-02 DIAGNOSIS — Z87440 Personal history of urinary (tract) infections: Secondary | ICD-10-CM | POA: Diagnosis not present

## 2023-06-02 DIAGNOSIS — G8250 Quadriplegia, unspecified: Secondary | ICD-10-CM | POA: Diagnosis not present

## 2023-06-03 LAB — LAB REPORT - SCANNED: EGFR: 109

## 2023-06-10 ENCOUNTER — Encounter: Payer: Self-pay | Admitting: Family Medicine

## 2023-06-16 DIAGNOSIS — Z993 Dependence on wheelchair: Secondary | ICD-10-CM | POA: Diagnosis not present

## 2023-06-16 DIAGNOSIS — G834 Cauda equina syndrome: Secondary | ICD-10-CM | POA: Diagnosis not present

## 2023-06-16 DIAGNOSIS — G8250 Quadriplegia, unspecified: Secondary | ICD-10-CM | POA: Diagnosis not present

## 2023-06-16 DIAGNOSIS — Z87440 Personal history of urinary (tract) infections: Secondary | ICD-10-CM | POA: Diagnosis not present

## 2023-06-16 DIAGNOSIS — Z466 Encounter for fitting and adjustment of urinary device: Secondary | ICD-10-CM | POA: Diagnosis not present

## 2023-06-22 ENCOUNTER — Telehealth: Payer: Self-pay

## 2023-06-22 DIAGNOSIS — Z993 Dependence on wheelchair: Secondary | ICD-10-CM | POA: Diagnosis not present

## 2023-06-22 DIAGNOSIS — Z466 Encounter for fitting and adjustment of urinary device: Secondary | ICD-10-CM | POA: Diagnosis not present

## 2023-06-22 DIAGNOSIS — Z87440 Personal history of urinary (tract) infections: Secondary | ICD-10-CM | POA: Diagnosis not present

## 2023-06-22 DIAGNOSIS — G834 Cauda equina syndrome: Secondary | ICD-10-CM | POA: Diagnosis not present

## 2023-06-22 DIAGNOSIS — G8250 Quadriplegia, unspecified: Secondary | ICD-10-CM | POA: Diagnosis not present

## 2023-06-22 NOTE — Telephone Encounter (Signed)
 Copied from CRM [id number]. Topic: Clinical - Home Health Verbal Orders >> Jun 22, 2023 11:15 AM Annice Kim wrote: Caller/Agency: Teresa/ [name]  Callback Number: [phone] Service Requested: Skilled Nursing Frequency: Every 3 weeks catheter change  Any new concerns about the patient? No

## 2023-06-23 DIAGNOSIS — N319 Neuromuscular dysfunction of bladder, unspecified: Secondary | ICD-10-CM | POA: Diagnosis not present

## 2023-06-23 DIAGNOSIS — G8250 Quadriplegia, unspecified: Secondary | ICD-10-CM | POA: Diagnosis not present

## 2023-06-23 DIAGNOSIS — Z87440 Personal history of urinary (tract) infections: Secondary | ICD-10-CM | POA: Diagnosis not present

## 2023-06-23 DIAGNOSIS — R339 Retention of urine, unspecified: Secondary | ICD-10-CM | POA: Diagnosis not present

## 2023-06-24 DIAGNOSIS — G8250 Quadriplegia, unspecified: Secondary | ICD-10-CM | POA: Diagnosis not present

## 2023-06-24 DIAGNOSIS — R339 Retention of urine, unspecified: Secondary | ICD-10-CM | POA: Diagnosis not present

## 2023-06-24 DIAGNOSIS — Z87440 Personal history of urinary (tract) infections: Secondary | ICD-10-CM | POA: Diagnosis not present

## 2023-06-28 NOTE — Telephone Encounter (Signed)
 Advised

## 2023-07-01 DIAGNOSIS — G834 Cauda equina syndrome: Secondary | ICD-10-CM | POA: Diagnosis not present

## 2023-07-01 DIAGNOSIS — Z993 Dependence on wheelchair: Secondary | ICD-10-CM | POA: Diagnosis not present

## 2023-07-01 DIAGNOSIS — Z87440 Personal history of urinary (tract) infections: Secondary | ICD-10-CM | POA: Diagnosis not present

## 2023-07-01 DIAGNOSIS — G8250 Quadriplegia, unspecified: Secondary | ICD-10-CM | POA: Diagnosis not present

## 2023-07-01 DIAGNOSIS — Z466 Encounter for fitting and adjustment of urinary device: Secondary | ICD-10-CM | POA: Diagnosis not present

## 2023-07-05 DIAGNOSIS — R339 Retention of urine, unspecified: Secondary | ICD-10-CM | POA: Diagnosis not present

## 2023-07-05 DIAGNOSIS — G8250 Quadriplegia, unspecified: Secondary | ICD-10-CM | POA: Diagnosis not present

## 2023-07-05 DIAGNOSIS — Z87440 Personal history of urinary (tract) infections: Secondary | ICD-10-CM | POA: Diagnosis not present

## 2023-07-07 DIAGNOSIS — G834 Cauda equina syndrome: Secondary | ICD-10-CM | POA: Diagnosis not present

## 2023-07-07 DIAGNOSIS — Z466 Encounter for fitting and adjustment of urinary device: Secondary | ICD-10-CM | POA: Diagnosis not present

## 2023-07-07 DIAGNOSIS — Z993 Dependence on wheelchair: Secondary | ICD-10-CM | POA: Diagnosis not present

## 2023-07-07 DIAGNOSIS — Z87440 Personal history of urinary (tract) infections: Secondary | ICD-10-CM | POA: Diagnosis not present

## 2023-07-07 DIAGNOSIS — G8250 Quadriplegia, unspecified: Secondary | ICD-10-CM | POA: Diagnosis not present

## 2023-07-09 DIAGNOSIS — R339 Retention of urine, unspecified: Secondary | ICD-10-CM | POA: Diagnosis not present

## 2023-07-09 DIAGNOSIS — Z87440 Personal history of urinary (tract) infections: Secondary | ICD-10-CM | POA: Diagnosis not present

## 2023-07-09 DIAGNOSIS — G8250 Quadriplegia, unspecified: Secondary | ICD-10-CM | POA: Diagnosis not present

## 2023-07-14 DIAGNOSIS — Z87440 Personal history of urinary (tract) infections: Secondary | ICD-10-CM | POA: Diagnosis not present

## 2023-07-14 DIAGNOSIS — G8250 Quadriplegia, unspecified: Secondary | ICD-10-CM | POA: Diagnosis not present

## 2023-07-14 DIAGNOSIS — R339 Retention of urine, unspecified: Secondary | ICD-10-CM | POA: Diagnosis not present

## 2023-07-26 ENCOUNTER — Other Ambulatory Visit: Payer: Self-pay | Admitting: Family Medicine

## 2023-07-26 DIAGNOSIS — R197 Diarrhea, unspecified: Secondary | ICD-10-CM

## 2023-07-28 DIAGNOSIS — Z993 Dependence on wheelchair: Secondary | ICD-10-CM | POA: Diagnosis not present

## 2023-07-28 DIAGNOSIS — G8250 Quadriplegia, unspecified: Secondary | ICD-10-CM | POA: Diagnosis not present

## 2023-07-28 DIAGNOSIS — Z466 Encounter for fitting and adjustment of urinary device: Secondary | ICD-10-CM | POA: Diagnosis not present

## 2023-07-28 DIAGNOSIS — G834 Cauda equina syndrome: Secondary | ICD-10-CM | POA: Diagnosis not present

## 2023-07-28 DIAGNOSIS — Z87440 Personal history of urinary (tract) infections: Secondary | ICD-10-CM | POA: Diagnosis not present

## 2023-08-01 DIAGNOSIS — R339 Retention of urine, unspecified: Secondary | ICD-10-CM | POA: Diagnosis not present

## 2023-08-02 ENCOUNTER — Ambulatory Visit: Payer: 59 | Admitting: Podiatry

## 2023-08-04 DIAGNOSIS — N319 Neuromuscular dysfunction of bladder, unspecified: Secondary | ICD-10-CM | POA: Diagnosis not present

## 2023-08-04 DIAGNOSIS — R339 Retention of urine, unspecified: Secondary | ICD-10-CM | POA: Diagnosis not present

## 2023-08-04 DIAGNOSIS — R93422 Abnormal radiologic findings on diagnostic imaging of left kidney: Secondary | ICD-10-CM | POA: Diagnosis not present

## 2023-08-04 DIAGNOSIS — Z87891 Personal history of nicotine dependence: Secondary | ICD-10-CM | POA: Diagnosis not present

## 2023-08-04 DIAGNOSIS — Z792 Long term (current) use of antibiotics: Secondary | ICD-10-CM | POA: Diagnosis not present

## 2023-08-04 DIAGNOSIS — N368 Other specified disorders of urethra: Secondary | ICD-10-CM | POA: Diagnosis not present

## 2023-08-04 DIAGNOSIS — N390 Urinary tract infection, site not specified: Secondary | ICD-10-CM | POA: Diagnosis not present

## 2023-08-04 DIAGNOSIS — G8250 Quadriplegia, unspecified: Secondary | ICD-10-CM | POA: Diagnosis not present

## 2023-08-04 DIAGNOSIS — R93421 Abnormal radiologic findings on diagnostic imaging of right kidney: Secondary | ICD-10-CM | POA: Diagnosis not present

## 2023-08-04 DIAGNOSIS — Z87440 Personal history of urinary (tract) infections: Secondary | ICD-10-CM | POA: Diagnosis not present

## 2023-08-08 ENCOUNTER — Encounter: Payer: Self-pay | Admitting: Acute Care

## 2023-08-10 DIAGNOSIS — Z466 Encounter for fitting and adjustment of urinary device: Secondary | ICD-10-CM | POA: Diagnosis not present

## 2023-08-10 DIAGNOSIS — G834 Cauda equina syndrome: Secondary | ICD-10-CM | POA: Diagnosis not present

## 2023-08-10 DIAGNOSIS — Z993 Dependence on wheelchair: Secondary | ICD-10-CM | POA: Diagnosis not present

## 2023-08-10 DIAGNOSIS — Z87440 Personal history of urinary (tract) infections: Secondary | ICD-10-CM | POA: Diagnosis not present

## 2023-08-10 DIAGNOSIS — G8250 Quadriplegia, unspecified: Secondary | ICD-10-CM | POA: Diagnosis not present

## 2023-08-15 DIAGNOSIS — R339 Retention of urine, unspecified: Secondary | ICD-10-CM | POA: Diagnosis not present

## 2023-08-15 DIAGNOSIS — Z87440 Personal history of urinary (tract) infections: Secondary | ICD-10-CM | POA: Diagnosis not present

## 2023-08-15 DIAGNOSIS — G8250 Quadriplegia, unspecified: Secondary | ICD-10-CM | POA: Diagnosis not present

## 2023-08-19 ENCOUNTER — Ambulatory Visit: Admitting: Family Medicine

## 2023-08-19 ENCOUNTER — Encounter: Payer: Self-pay | Admitting: Family Medicine

## 2023-08-19 ENCOUNTER — Telehealth: Payer: Self-pay

## 2023-08-19 VITALS — BP 112/64 | HR 69 | Temp 98.2°F | Resp 20 | Ht 65.0 in | Wt 150.0 lb

## 2023-08-19 DIAGNOSIS — M8000XG Age-related osteoporosis with current pathological fracture, unspecified site, subsequent encounter for fracture with delayed healing: Secondary | ICD-10-CM

## 2023-08-19 DIAGNOSIS — E441 Mild protein-calorie malnutrition: Secondary | ICD-10-CM | POA: Diagnosis not present

## 2023-08-19 DIAGNOSIS — N39498 Other specified urinary incontinence: Secondary | ICD-10-CM

## 2023-08-19 DIAGNOSIS — J189 Pneumonia, unspecified organism: Secondary | ICD-10-CM | POA: Diagnosis not present

## 2023-08-19 DIAGNOSIS — G8250 Quadriplegia, unspecified: Secondary | ICD-10-CM | POA: Diagnosis not present

## 2023-08-19 DIAGNOSIS — R32 Unspecified urinary incontinence: Secondary | ICD-10-CM

## 2023-08-19 DIAGNOSIS — Z872 Personal history of diseases of the skin and subcutaneous tissue: Secondary | ICD-10-CM | POA: Diagnosis not present

## 2023-08-19 DIAGNOSIS — J432 Centrilobular emphysema: Secondary | ICD-10-CM

## 2023-08-19 DIAGNOSIS — K521 Toxic gastroenteritis and colitis: Secondary | ICD-10-CM

## 2023-08-19 DIAGNOSIS — Z9189 Other specified personal risk factors, not elsewhere classified: Secondary | ICD-10-CM | POA: Diagnosis not present

## 2023-08-19 MED ORDER — CEFPODOXIME PROXETIL 200 MG PO TABS: 200.0000 mg | ORAL_TABLET | Freq: Two times a day (BID) | ORAL | 0 refills | Status: DC

## 2023-08-19 MED ORDER — ALBUTEROL SULFATE HFA 108 (90 BASE) MCG/ACT IN AERS
2.0000 | INHALATION_SPRAY | Freq: Four times a day (QID) | RESPIRATORY_TRACT | 6 refills | Status: AC | PRN
Start: 2023-08-19 — End: ?

## 2023-08-19 MED ORDER — SODIUM CHLORIDE 3 % IN NEBU: INHALATION_SOLUTION | RESPIRATORY_TRACT | 12 refills | Status: DC | PRN

## 2023-08-19 MED ORDER — SODIUM CHLORIDE 3 % IN NEBU
INHALATION_SOLUTION | RESPIRATORY_TRACT | 12 refills | Status: DC | PRN
Start: 2023-08-19 — End: 2023-08-19

## 2023-08-19 MED ORDER — SODIUM CHLORIDE 3 % IN NEBU: INHALATION_SOLUTION | RESPIRATORY_TRACT | 12 refills | Status: DC

## 2023-08-19 MED ORDER — SPACER/AERO-HOLDING CHAMBERS DEVI
1.0000 | 1 refills | Status: AC
Start: 2023-08-19 — End: ?

## 2023-08-19 MED ORDER — BUDESONIDE-FORMOTEROL FUMARATE 160-4.5 MCG/ACT IN AERO: 2.0000 | INHALATION_SPRAY | Freq: Two times a day (BID) | RESPIRATORY_TRACT | 11 refills | Status: AC

## 2023-08-19 NOTE — Telephone Encounter (Signed)
 Copied from CRM [id number]. Topic: Clinical - Prescription Issue >> Aug 19, 2023 12:15 PM Felizardo Hotter wrote: Reason for CRM: Received call from [facility] - [place], [place] - [address] [place] [address] Phone: [phone] Fax: [phone], per Rice Chamorro need frequency and it only comes in 720 and not 750. Please make adjustments.

## 2023-08-19 NOTE — Patient Instructions (Signed)
 You can also use an incentive spirometer to help you take deep breaths.  Another option is an Acapella respiratory device to help loosen secretions.

## 2023-08-19 NOTE — Progress Notes (Addendum)
 Established patient visit   Patient: Heidi Hess   DOB: 09/03/1960   63 y.o. Female  MRN: [id number] Visit Date: 08/19/2023  Today's healthcare provider: LAURAINE LOISE BUOY, DO   Chief Complaint  Patient presents with   Follow-up   URI    Started with sneezing, runny nose otc: allergy med. Now has a dry cough, chest congestion, yellow/green phlegm, nausea, sob onset 3 weeks   Subjective    URI  Associated symptoms include congestion, coughing, diarrhea (had it last weekend), nausea and rhinorrhea. Pertinent negatives include no chest pain, ear pain, sinus pain, sneezing, sore throat or vomiting.    Heidi Hess is a 63 year old female with quadriplegia and quadriparesis who presents with persistent respiratory symptoms and diarrhea.  She has been experiencing respiratory symptoms for almost three weeks, including fatigue, persistent cough, and shortness of breath. Her sputum has changed in color from clear to gray, yellow, bright green, and currently grayish-green. She had a headache initially, which she attributed to allergies. - has tried Mucinex and Coricidin without relief, but finds that coughing helps clear her chest congestion. - history of pneumonia and was treated with antibiotics in March, which improved her condition until she became ill again. She does not frequently get pneumonia and has not had it in years.  She experiences shortness of breath depending on her activity level and does not currently use an inhaler.  She has been experiencing nausea and intermittent diarrhea, with the latter being a recurring issue since last weekend. She notes that she has been using Dulcolax suppositories. No recent fever or chills, but she had them last weekend. She has been using Florastor, which she finds helpful for her diarrhea, and Colestipol , which she is unsure if it helps. She was previously on oxybutynin  and nitrofurantoin , which she has stopped using based on  recommendations by her urologist. She has used Flagyl  in the past for diarrhea, which was effective.  Her suprapubic catheter was removed, and she is urinating in a diaper without issues. She notes no increase in urine output compared to when she had the catheter.  She stays in bed on weekends due to lack of assistance, which increases her risk for pressure sores. She has an air overlay mattress that helps with pressure relief. She has been trying to obtain a fully electric hospital bed to replace her current one, which is over 11 years old.      Medications: Outpatient Medications Prior to Visit  Medication Sig   Acetaminophen 325 MG CAPS    Ascorbic Acid (VITAMIN C) 500 MG CHEW Chew 500 mg by mouth daily.   baclofen  (LIORESAL ) 10 MG tablet Take 1 tablet (10 mg total) by mouth 3 (three) times daily.   bisacodyl (DULCOLAX) 10 MG suppository Place 10 mg rectally daily.   Calcium  Carb-Cholecalciferol (OYSTER SHELL CALCIUM ) 500-400 MG-UNIT TABS    colestipol  (COLESTID ) 1 g tablet Take 1 tablet (1 g total) by mouth 2 (two) times daily.   Docusate Sodium (DSS) 250 MG CAPS    loratadine (CLARITIN) 10 MG tablet Take 10 mg by mouth daily.   Multiple Vitamins-Minerals (CENTRUM ADULTS) TABS    Olopatadine  HCl 0.2 % SOLN Place 1 drop into both eyes daily at 2 PM.   rosuvastatin  (CRESTOR ) 5 MG tablet Take 1 tablet (5 mg total) by mouth daily.   saccharomyces boulardii (FLORASTOR) 250 MG capsule Take 1 capsule (250 mg total) by mouth 2 (two) times daily.  Take at least two hours before or after antibiotics.   Simethicone 180 MG CAPS Take 180 mg by mouth daily.   Vitamin D , Cholecalciferol, 25 MCG (1000 UT) TABS Take 5,000 Units by mouth daily at 2 PM.   [DISCONTINUED] metroNIDAZOLE  (FLAGYL ) 500 MG tablet Take 1 tablet (500 mg total) by mouth 2 (two) times daily.   [DISCONTINUED] amoxicillin -clavulanate (AUGMENTIN ) 875-125 MG tablet Take 1 tablet by mouth 2 (two) times daily.   [DISCONTINUED]  Melatonin 1 MG CAPS Take 1 capsule by mouth at bedtime as needed.   [DISCONTINUED] oxybutynin  (DITROPAN  XL) 15 MG 24 hr tablet Take 1 tablet (15 mg total) by mouth daily.   No facility-administered medications prior to visit.    Review of Systems  Constitutional:  Negative for chills (last weekend, but now resolved) and fever (last weekend, but now resolved).  HENT:  Positive for congestion and rhinorrhea. Negative for ear pain, sinus pressure, sinus pain, sneezing and sore throat.   Respiratory:  Positive for cough and shortness of breath.   Cardiovascular:  Negative for chest pain and palpitations.  Gastrointestinal:  Positive for diarrhea (had it last weekend) and nausea. Negative for constipation and vomiting.        Objective    BP 112/64 (BP Location: Left Arm, Patient Position: Sitting, Cuff Size: Normal)   Pulse 69   Temp 98.2 F (36.8 C) (Oral)   Resp 20   Ht 5' 5 (1.651 m)   Wt 150 lb (68 kg)   SpO2 98%   BMI 24.96 kg/m     Physical Exam Constitutional:      Appearance: Normal appearance.  HENT:     Head: Normocephalic and atraumatic.   Eyes:     General: No scleral icterus.    Extraocular Movements: Extraocular movements intact.     Conjunctiva/sclera: Conjunctivae normal.    Cardiovascular:     Rate and Rhythm: Normal rate and regular rhythm.     Pulses: Normal pulses.     Heart sounds: Normal heart sounds.  Pulmonary:     Effort: Pulmonary effort is normal. No respiratory distress.     Breath sounds: Normal breath sounds.  Abdominal:     General: Bowel sounds are normal. There is no distension.     Palpations: Abdomen is soft. There is no mass.     Tenderness: There is no abdominal tenderness. There is no guarding.   Musculoskeletal:     Right lower leg: No edema.     Left lower leg: No edema.   Skin:    General: Skin is dry.   Neurological:     Mental Status: She is alert and oriented to person, place, and time. Mental status is at  baseline.   Psychiatric:        Mood and Affect: Mood normal.        Behavior: Behavior normal.      No results found for any visits on 08/19/23.  Assessment & Plan    Community acquired pneumonia, unspecified laterality -     Cefpodoxime  Proxetil; Take 1 tablet (200 mg total) by mouth 2 (two) times daily.  Dispense: 20 tablet; Refill: 0 -     Sodium Chloride ; Inhale 4 mL through nebulizer twice daily as needed for congestion.  Dispense: 720 mL; Refill: 12  Centrilobular emphysema (HCC) -     Budesonide -Formoterol  Fumarate; Inhale 2 puffs into the lungs 2 (two) times daily.  Dispense: 1 each; Refill: 11 -  Albuterol  Sulfate HFA; Inhale 2 puffs into the lungs every 6 (six) hours as needed for wheezing or shortness of breath.  Dispense: 8 g; Refill: 6 -     Spacer/Aero-Holding Chambers; 1 each by Does not apply route as directed. For use with inhalers  Dispense: 1 each; Refill: 1  Diarrhea due to drug  Quadriplegia and quadriparesis (HCC) -     For home use only DME Specialty mattress -     For home use only DME Air overlay mattress      Community-acquired pneumonia Symptoms persist with variable sputum color. Previous Augmentin  treatment. Discussed cefotaxime to alternate antibiotics and reduce resistance. Discussed incentive spirometer and saline nebulizer for deep breathing and secretion clearance. Considered acapella device. - Prescribe cefotaxime for 10 days. - Recommend use of incentive spirometer. - Discuss use of saline nebulizer with separate tubing. - Consider acapella respiratory device for secretion clearance.  Centrilobular emphysema CT scan showed bronchial wall thickening and mild emphysema. No COPD. Shortness of breath with exertion. Discussed Symbicort  and albuterol  inhalers. Considered spacer for inhaler use. - Prescribe Symbicort  inhaler, two puffs twice daily. - Prescribe albuterol  inhaler for acute symptoms. - Consider spacer for inhaler  use.  Diarrhea Intermittent diarrhea possibly linked to Dulcolax suppositories. Previous Flagyl  and Florastor showed improvement. Discussed reducing Dulcolax dose or using stool softener. - Discontinue Flagyl . - Continue Florastor probiotic. - Consider reducing Dulcolax suppository dose to 5 mg at a time and wait a full 24 hours before considering additional.  Recommend using a stool softener, such as Colace, as needed instead.  Quadriplegia and quadriparesis; history of pressure ulcer; at high risk for pressure ulcer; at high risk for aspiration; at risk for joint contracture; urinary incontinence; osteoporosis with pathologic fracture Patient needs a replacement mattress for her hospital bed.  She has previously tried to get a new one but the company wanted to replace her entire bed.  Unfortunately the replacement was going to be a manual bed, rather than a fully electric bed, which she is unable to actuate on her own.  Will submit order for replacement mattress and air overlay, with instructions to order new fully electric hospital bed if the entire bed has to be replaced.  - The patient is a C6 complete quadriplegic who does have some upper extremity C5 function. The patient has baseline with no motor or sensation in the lower extremities or abdomen.  In other words, She has no use of her lower extremities and minimal use of her upper extremities, including only having partial use of one hand with contracture present to the other hand.   - She needs a fully electric hospital bed due to her inability to reposition herself.  She does not have help readily available to position her, to the extent that, on the weekends, she is unable to leave her bed at all and is only intermittently repositioned with her sister's help.  She is not able to reposition herself.  Of note, her sister is planning to move out of the home and the patient will no longer have this help.   Having a fully electric hospital bed will  help to prevent ulcers (especially given that she is incontinent), aspiration and contractures.  I will also help to prevent additional fractures by reducing the need to position her manually, as she suffered a subtrochanteric fracture of her right femur November 2023 related to her severe osteoporosis, with no known cause other than being lifted out of her wheelchair and  placed into the car.    Return in about 4 months (around 12/12/2023) for Chronic f/u; flu shot.      I discussed the assessment and treatment plan with the patient  The patient was provided an opportunity to ask questions and all were answered. The patient agreed with the plan and demonstrated an understanding of the instructions.   The patient was advised to call back or seek an in-person evaluation if the symptoms worsen or if the condition fails to improve as anticipated.    LAURAINE LOISE BUOY, DO  [facility] [phone] (phone) [phone] (fax)  [facility]

## 2023-08-23 ENCOUNTER — Encounter: Payer: Self-pay | Admitting: Family Medicine

## 2023-08-23 ENCOUNTER — Telehealth: Payer: Self-pay

## 2023-08-23 NOTE — Telephone Encounter (Signed)
 fyi

## 2023-08-23 NOTE — Telephone Encounter (Signed)
 I faxed over forms to [employer] [phone]

## 2023-08-23 NOTE — Telephone Encounter (Signed)
 Copied from CRM [id number]. Topic: Clinical - Order For Equipment >> Aug 23, 2023 12:46 PM Chasity T wrote: Reason for CRM: Ammon Bales from [employer] medical supply company is calling in because she needs a new order for patient due to the order that was sent for the full electric bed and low air loft mattress she does not qualify for. She needs a new order for a semi electric hospital bed and gel overlay mattress. Fax orders to [phone].

## 2023-08-29 DIAGNOSIS — Z872 Personal history of diseases of the skin and subcutaneous tissue: Secondary | ICD-10-CM | POA: Insufficient documentation

## 2023-08-29 DIAGNOSIS — Z9189 Other specified personal risk factors, not elsewhere classified: Secondary | ICD-10-CM | POA: Insufficient documentation

## 2023-08-29 NOTE — Telephone Encounter (Signed)
 Ranee calling from [employer] is calling to follow up on the below request. Also missing the narratives for the hospital bed.  Phone- [phone]

## 2023-08-29 NOTE — Telephone Encounter (Signed)
 Faxed notes over to [employer] to [phone]. 08/29/23 at 4:48 PM

## 2023-08-30 ENCOUNTER — Ambulatory Visit (INDEPENDENT_AMBULATORY_CARE_PROVIDER_SITE_OTHER): Admitting: Podiatry

## 2023-08-30 ENCOUNTER — Encounter: Payer: Self-pay | Admitting: Family Medicine

## 2023-08-30 ENCOUNTER — Encounter: Payer: Self-pay | Admitting: Podiatry

## 2023-08-30 VITALS — Ht 65.0 in | Wt 150.0 lb

## 2023-08-30 DIAGNOSIS — J432 Centrilobular emphysema: Secondary | ICD-10-CM

## 2023-08-30 DIAGNOSIS — M79675 Pain in left toe(s): Secondary | ICD-10-CM

## 2023-08-30 DIAGNOSIS — B351 Tinea unguium: Secondary | ICD-10-CM | POA: Diagnosis not present

## 2023-08-30 DIAGNOSIS — M79674 Pain in right toe(s): Secondary | ICD-10-CM | POA: Diagnosis not present

## 2023-08-30 DIAGNOSIS — J189 Pneumonia, unspecified organism: Secondary | ICD-10-CM

## 2023-08-30 MED ORDER — HYDROCOD POLI-CHLORPHE POLI ER 10-8 MG/5ML PO SUER: 5.0000 mL | Freq: Two times a day (BID) | ORAL | 0 refills | Status: AC | PRN

## 2023-08-30 MED ORDER — PREDNISONE 20 MG PO TABS: ORAL_TABLET | ORAL | 0 refills | Status: DC

## 2023-08-30 NOTE — Progress Notes (Signed)
   Chief Complaint  Patient presents with   Nail Problem    RFC.    SUBJECTIVE Patient PmHx quadriplegia nonambulatory in a wheelchair presents to office today complaining of elongated, thickened nails that cause pain while ambulating in shoes.  Patient is unable to trim their own nails. Patient is here for further evaluation and treatment.  Past Medical History:  Diagnosis Date   Allergy    Cancer (HCC)    endometrial   Centrilobular emphysema (HCC) 08/19/2023   Endometrial cancer (HCC) 01/25/2018   Formatting of this note might be different from the original.  EMB 11/19 FIGO Grade 1  Formatting of this note might be different from the original.  Formatting of this note might be different from the original.  EMB 11/19 FIGO Grade 1     Osteoporosis    Quadriplegia (HCC)    C-6    Allergies  Allergen Reactions   Latex Rash   Nickel Dermatitis, Itching and Rash   Sulfa Antibiotics Itching, Nausea Only and Rash     OBJECTIVE General Patient is awake, alert, and oriented x 3 and in no acute distress. Derm Skin is dry and supple bilateral. Negative open lesions or macerations. Remaining integument unremarkable. Nails are tender, long, thickened and dystrophic with subungual debris, consistent with onychomycosis, 1-5 bilateral. No signs of infection noted. Vasc  DP and PT pedal pulses palpable bilaterally. Temperature gradient within normal limits.  Neuro light touch and protective threshold sensation diminished bilateral Musculoskeletal Exam patient is nonambulatory in a wheelchair  ASSESSMENT 1.  Pain due to onychomycosis of toenails both  PLAN OF CARE 1. Patient evaluated today.  2. Instructed to maintain good pedal hygiene and foot care.  3. Mechanical debridement of nails 1-5 bilaterally performed using a nail nipper. Filed with dremel without incident.  4. Return to clinic as needed   Thresa EMERSON Sar, DPM [facility]  Dr. Thresa EMERSON Sar, DPM    [address] [place], [place] [address]                Office [phone]  Fax [phone]

## 2023-09-02 DIAGNOSIS — G8253 Quadriplegia, C5-C7 complete: Secondary | ICD-10-CM | POA: Diagnosis not present

## 2023-09-02 DIAGNOSIS — G8250 Quadriplegia, unspecified: Secondary | ICD-10-CM | POA: Diagnosis not present

## 2023-09-26 ENCOUNTER — Ambulatory Visit: Payer: 59

## 2023-09-26 VITALS — Ht 66.0 in | Wt 155.0 lb

## 2023-09-26 DIAGNOSIS — Z0000 Encounter for general adult medical examination without abnormal findings: Secondary | ICD-10-CM | POA: Diagnosis not present

## 2023-09-26 DIAGNOSIS — Z1231 Encounter for screening mammogram for malignant neoplasm of breast: Secondary | ICD-10-CM

## 2023-09-26 NOTE — Progress Notes (Signed)
 Subjective:   Heidi Hess is a 63 y.o. who presents for a Medicare Wellness preventive visit.  As a reminder, Annual Wellness Visits don't include a physical exam, and some assessments may be limited, especially if this visit is performed virtually. We may recommend an in-person follow-up visit with your provider if needed.  Visit Complete: Virtual I connected with  Heidi Hess on 09/26/23 by a audio enabled telemedicine application and verified that I am speaking with the correct person using two identifiers.  Patient Location: Home  Provider Location: Home Office  I discussed the limitations of evaluation and management by telemedicine. The patient expressed understanding and agreed to proceed.  Vital Signs: Because this visit was a virtual/telehealth visit, some criteria may be missing or patient reported. Any vitals not documented were not able to be obtained and vitals that have been documented are patient reported.  VideoDeclined- This patient declined Librarian, academic. Therefore the visit was completed with audio only.  Persons Participating in Visit: Patient.  AWV Questionnaire: Yes: Patient Medicare AWV questionnaire was completed by the patient on 09/22/2023; I have confirmed that all information answered by patient is correct and no changes since this date.  Cardiac Risk Factors include: dyslipidemia;sedentary lifestyle;Other (see comment), Risk factor comments: patient is a quadriplegic     Objective:    Today's Vitals   09/26/23 1413  Weight: 155 lb (70.3 kg)  Height: 5' 6 (1.676 m)   Body mass index is 25.02 kg/m.     09/26/2023    2:07 PM 09/21/2022   11:47 AM 02/02/2022    2:48 PM 05/17/2018   11:34 AM  Advanced Directives  Does Patient Have a Medical Advance Directive? No No No No   Would patient like information on creating a medical advance directive? Yes (MAU/Ambulatory/Procedural Areas - Information given)   No -  Patient declined      Data saved with a previous flowsheet row definition    Current Medications (verified) Outpatient Encounter Medications as of 09/26/2023  Medication Sig   Acetaminophen 325 MG CAPS    albuterol  (VENTOLIN  HFA) 108 (90 Base) MCG/ACT inhaler Inhale 2 puffs into the lungs every 6 (six) hours as needed for wheezing or shortness of breath.   Ascorbic Acid (VITAMIN C) 500 MG CHEW Chew 500 mg by mouth daily.   baclofen  (LIORESAL ) 10 MG tablet Take 1 tablet (10 mg total) by mouth 3 (three) times daily.   bisacodyl (DULCOLAX) 10 MG suppository Place 10 mg rectally daily.   budesonide -formoterol  (SYMBICORT ) 160-4.5 MCG/ACT inhaler Inhale 2 puffs into the lungs 2 (two) times daily.   Calcium  Carb-Cholecalciferol (OYSTER SHELL CALCIUM ) 500-400 MG-UNIT TABS    chlorpheniramine-HYDROcodone (TUSSIONEX) 10-8 MG/5ML Take 5 mLs by mouth every 12 (twelve) hours as needed for cough.   colestipol  (COLESTID ) 1 g tablet Take 1 tablet (1 g total) by mouth 2 (two) times daily.   Docusate Sodium (DSS) 250 MG CAPS    loratadine (CLARITIN) 10 MG tablet Take 10 mg by mouth daily.   Multiple Vitamins-Minerals (CENTRUM ADULTS) TABS    Olopatadine  HCl 0.2 % SOLN Place 1 drop into both eyes daily at 2 PM.   rosuvastatin  (CRESTOR ) 5 MG tablet Take 1 tablet (5 mg total) by mouth daily.   saccharomyces boulardii (FLORASTOR) 250 MG capsule Take 1 capsule (250 mg total) by mouth 2 (two) times daily. Take at least two hours before or after antibiotics.   Simethicone 180 MG CAPS Take 180  mg by mouth daily.   Spacer/Aero-Holding Chambers DEVI 1 each by Does not apply route as directed. For use with inhalers   Vitamin D , Cholecalciferol, 25 MCG (1000 UT) TABS Take 5,000 Units by mouth daily at 2 PM.   [DISCONTINUED] cefpodoxime  (VANTIN ) 200 MG tablet Take 1 tablet (200 mg total) by mouth 2 (two) times daily.   [DISCONTINUED] predniSONE  (DELTASONE ) 20 MG tablet Take 60mg  PO daily x 2 days, then40mg  PO daily x 2  days, then 20mg  PO daily x 3 days   [DISCONTINUED] sodium chloride  HYPERTONIC 3 % nebulizer solution Inhale 4 mL through nebulizer twice daily as needed for congestion.   No facility-administered encounter medications on file as of 09/26/2023.    Allergies (verified) Latex, Nickel, and Sulfa antibiotics   History: Past Medical History:  Diagnosis Date   Allergy    Cancer (HCC)    endometrial   Centrilobular emphysema (HCC) 08/19/2023   Endometrial cancer (HCC) 01/25/2018   Formatting of this note might be different from the original.  EMB 11/19 FIGO Grade 1  Formatting of this note might be different from the original.  Formatting of this note might be different from the original.  EMB 11/19 FIGO Grade 1     Osteoporosis    Quadriplegia (HCC)    C-6   Past Surgical History:  Procedure Laterality Date   ABDOMINAL HYSTERECTOMY     CERVICAL SPINE SURGERY     1979   ROBOTIC ASSISTED SUPRACERVICAL HYSTERECTOMY WITH BILATERAL SALPINGO OOPHERECTOMY Bilateral 03/30/2018   Uterus, cervix, bilateral fallopian tubes and ovaries, hysterectomy and bilateral salpingo-oophorectomy   SPINE SURGERY  August [facility] at [facility]   l fusion 5 and 6 vertebrae   Family History  Problem Relation Age of Onset   Hypertension Mother    Arthritis Mother    Hearing loss Mother    Heart disease Father    Cancer Father        throat   Hypertension Father    Alcohol abuse Father    Arthritis Father    Hypertension Sister    Anemia Sister        Autoimmune hemolytic anemia   Arthritis Sister    COPD Sister    Hypertension Sister    Miscarriages / Stillbirths Sister    Varicose Veins Sister    Hypertension Brother    COPD Brother    Drug abuse Brother    Arthritis Maternal Aunt    Cancer Maternal Aunt    Hypertension Maternal Aunt    Breast cancer Paternal Aunt    Hypertension Maternal Grandmother    Stroke Maternal Grandmother    Arthritis Maternal Grandmother    Varicose Veins Maternal Grandmother     Hypertension Maternal Grandfather    Diabetes Maternal Grandfather    Hearing loss Maternal Grandfather    Heart disease Maternal Grandfather    Heart disease Paternal Grandmother    Hypertension Paternal Grandmother    Stroke Paternal Grandmother    Hypertension Paternal Grandfather    Breast cancer Cousin        maternal   Social History   Socioeconomic History   Marital status: Single    Spouse name: Not on file   Number of children: Not on file   Years of education: 12   Highest education level: 8th grade  Occupational History   Not on file  Tobacco Use   Smoking status: Former    Current packs/day: 0.00    Average packs/day: 1  pack/day for 36.0 years (36.0 ttl pk-yrs)    Types: Cigarettes    Start date: 66    Quit date: 2010    Years since quitting: 15.5    Passive exposure: Past   Smokeless tobacco: Never   Tobacco comments:    do not smoke anymore  Vaping Use   Vaping status: Never Used  Substance and Sexual Activity   Alcohol use: Not Currently   Drug use: Not Currently    Types: Cocaine, Marijuana    Comment: no drugs in 30 years   Sexual activity: Not Currently  Other Topics Concern   Not on file  Social History Narrative   Not on file   Social Drivers of Health   Financial Resource Strain: Low Risk  (09/26/2023)   Overall Financial Resource Strain (CARDIA)    Difficulty of Paying Living Expenses: Not hard at all  Food Insecurity: No Food Insecurity (09/26/2023)   Hunger Vital Sign    Worried About Running Out of Food in the Last Year: Never true    Ran Out of Food in the Last Year: Never true  Transportation Needs: No Transportation Needs (09/26/2023)   PRAPARE - Administrator, Civil Service (Medical): No    Lack of Transportation (Non-Medical): No  Physical Activity: Inactive (09/26/2023)   Exercise Vital Sign    Days of Exercise per Week: 0 days    Minutes of Exercise per Session: 0 min  Stress: No Stress Concern Present  (09/26/2023)   [employer] of Occupational Health - Occupational Stress Questionnaire    Feeling of Stress: Not at all  Social Connections: Moderately Isolated (09/26/2023)   Social Connection and Isolation Panel    Frequency of Communication with Friends and Family: More than three times a week    Frequency of Social Gatherings with Friends and Family: More than three times a week    Attends Religious Services: More than 4 times per year    Active Member of [employer] or Organizations: No    Attends Engineer, structural: Never    Marital Status: Never married    Tobacco Counseling Counseling given: Yes Tobacco comments: do not smoke anymore    Clinical Intake:  Pre-visit preparation completed: Yes  Pain : No/denies pain     BMI - recorded: 25.02 Nutritional Status: BMI 25 -29 Overweight Diabetes: No  No results found for: HGBA1C   How often do you need to have someone help you when you read instructions, pamphlets, or other written materials from your doctor or pharmacy?: 1 - Never     Information entered by :: Stefano ORN [facility]   Activities of Daily Living     09/22/2023    1:15 PM  In your present state of health, do you have any difficulty performing the following activities:  Hearing? 0  Vision? 0  Difficulty concentrating or making decisions? 0  Walking or climbing stairs? 1  Dressing or bathing? 1  Doing errands, shopping? 1  Preparing Food and eating ? Y  Using the Toilet? Y  In the past six months, have you accidently leaked urine? Y  Do you have problems with loss of bowel control? Y  Managing your Medications? N  Managing your Finances? N  Housekeeping or managing your Housekeeping? Y    Patient Care Team: Donzella Lauraine SAILOR, DO as PCP - General (Family Medicine) Janit Thresa HERO, DPM as Consulting Physician (Podiatry) Ruthell Lauraine FALCON, NP as Nurse Practitioner (Pulmonary Disease) Jillyn,  Tully HERO, MD as Referring Physician (Urology)  I  have updated your Care Teams any recent Medical Services you may have received from other providers in the past year.     Assessment:   This is a routine wellness examination for [facility].  Hearing/Vision screen Hearing Screening - Comments:: Patient denies any hearing difficulties.   Vision Screening - Comments:: Patient is not up to date on yearly eye exams.  Patient declined referral to establish with an ophthalmologist.     Goals Addressed             This Visit's Progress    Patient Stated       I want to remain active and independent.        Depression Screen     09/26/2023    2:25 PM 12/21/2022    1:11 PM 09/21/2022   11:46 AM 08/30/2022    1:22 PM 12/22/2021   12:02 PM 12/22/2020   11:22 AM 12/02/2020   12:56 PM  PHQ 2/9 Scores  PHQ - 2 Score 0 0 0 0 0 0 0  PHQ- 9 Score 2   2       Fall Risk     09/22/2023    1:15 PM 12/21/2022    1:10 PM 09/20/2022    4:17 PM 08/30/2022    1:22 PM 12/22/2020   11:22 AM  Fall Risk   Falls in the past year? 0 0 1 0 0  Number falls in past yr: 0 0 0 0 0  Injury with Fall? 0 0 0 0 0  Risk for fall due to : Other (Comment)  No Fall Risks  No Fall Risks  Risk for fall due to: Comment quadriplegic      Follow up Falls evaluation completed;Education provided;Falls prevention discussed Falls evaluation completed Falls prevention discussed;Follow up appointment  Falls evaluation completed      Data saved with a previous flowsheet row definition    MEDICARE RISK AT HOME:  Medicare Risk at Home Any stairs in or around the home?: (Patient-Rptd) Yes If so, are there any without handrails?: (Patient-Rptd) Yes Home free of loose throw rugs in walkways, pet beds, electrical cords, etc?: (Patient-Rptd) Yes Adequate lighting in your home to reduce risk of falls?: (Patient-Rptd) Yes Life alert?: (Patient-Rptd) No Use of a cane, walker or w/c?: (Patient-Rptd) Yes Grab bars in the bathroom?: (Patient-Rptd) Yes Shower chair or bench in  shower?: (Patient-Rptd) No Elevated toilet seat or a handicapped toilet?: (Patient-Rptd) Yes  TIMED UP AND GO:  Was the test performed?  No  Cognitive Function: 6CIT completed        09/26/2023    2:24 PM 09/21/2022   11:51 AM  6CIT Screen  What Year? 0 points 0 points  What month? 0 points 0 points  What time? 0 points 0 points  Count back from 20 0 points 0 points  Months in reverse 0 points 0 points  Repeat phrase 0 points 0 points  Total Score 0 points 0 points    Immunizations Immunization History  Administered Date(s) Administered   Hepatitis B 09/01/2007   Influenza,inj,Quad PF,6+ Mos 12/13/2018, 12/19/2019, 12/22/2020   Influenza-Unspecified 12/06/2017, 12/15/2017, 12/19/2019, 12/10/2021, 12/10/2021, 12/13/2022   PFIZER(Purple Top)SARS-COV-2 Vaccination 06/04/2019, 06/27/2019, 05/12/2020   PNEUMOCOCCAL CONJUGATE-20 12/22/2020   Pfizer Covid-19 Vaccine Bivalent Booster 24yrs & up 01/13/2021, 12/13/2022   Tdap 06/04/2020   Unspecified SARS-COV-2 Vaccination 12/10/2021   Zoster Recombinant(Shingrix) 03/08/2020, 01/13/2021    Screening Tests Health Maintenance  Topic Date Due   Hepatitis B Vaccines (2 of 3 - 19+ 3-dose series) 09/29/2007   COVID-19 Vaccine (7 - 2024-25 season) 12/07/2023 (Originally 02/07/2023)   Colonoscopy  05/19/2024 (Originally 01/14/2004)   INFLUENZA VACCINE  10/07/2023   MAMMOGRAM  12/13/2023   Lung Cancer Screening  04/10/2024   Medicare Annual Wellness (AWV)  09/25/2024   DTaP/Tdap/Td (2 - Td or Tdap) 06/05/2030   Pneumococcal Vaccine 62-36 Years old  Completed   Hepatitis C Screening  Completed   HIV Screening  Completed   Zoster Vaccines- Shingrix  Completed   HPV VACCINES  Aged Out   Meningococcal B Vaccine  Aged Out    Health Maintenance  Health Maintenance Due  Topic Date Due   Hepatitis B Vaccines (2 of 3 - 19+ 3-dose series) 09/29/2007   Health Maintenance Items Addressed: Mammogram ordered  Additional  Screening:  Vision Screening: Recommended annual ophthalmology exams for early detection of glaucoma and other disorders of the eye. Would you like a referral to an eye doctor? No    Dental Screening: Recommended annual dental exams for proper oral hygiene  Community Resource Referral / Chronic Care Management: CRR required this visit?  No   CCM required this visit?  No   Plan:    I have personally reviewed and noted the following in the patient's chart:   Medical and social history Use of alcohol, tobacco or illicit drugs  Current medications and supplements including opioid prescriptions. Patient is not currently taking opioid prescriptions. Functional ability and status Nutritional status Physical activity Advanced directives List of other physicians Hospitalizations, surgeries, and ER visits in previous 12 months Vitals Screenings to include cognitive, depression, and falls Referrals and appointments  In addition, I have reviewed and discussed with patient certain preventive protocols, quality metrics, and best practice recommendations. A written personalized care plan for preventive services as well as general preventive health recommendations were provided to patient.   Nadiyah Zeis, CMA   09/26/2023   After Visit Summary: (MyChart) Due to this being a telephonic visit, the after visit summary with patients personalized plan was offered to patient via MyChart   Notes: Nothing significant to report at this time.

## 2023-09-26 NOTE — Patient Instructions (Signed)
 Ms. Heidi Hess ,  Thank you for taking time out of your busy schedule to complete your Annual Wellness Visit with me. I enjoyed our conversation and look forward to speaking with you again next year. I, as well as your care team,  appreciate your ongoing commitment to your health goals. Please review the following plan we discussed and let me know if I can assist you in the future.  I enjoyed our conversation and look forward to it again next year. Blessing for the upcoming year!!  -Wayman Hoard     TOGETHER, WE CAME UP WITH THE FOLLOWING GAME PLAN!  Referrals/Orders/Labs Placed Today: Mammogram: [facility]  [address] [place] [address] [phone] Follow up Visits Next Visit with PCP:Dec 21, 2023 at 10:00 am in office  Next Medicare Wellness w/ Health Advisor (1 year):Sep 26, 2024 at 3:10 pm video visit Clinician Recommendations:  Aim for 30 minutes of exercise or brisk walking, 6-8 glasses of water, and 5 servings of fruits and vegetables each day.       This is a list of the screening recommended for you and due dates:  Health Maintenance  Topic Date Due   DTaP/Tdap/Td vaccine (1 - Tdap) Never done   Zoster (Shingles) Vaccine (2 of 2) 07/12/2021   COVID-19 Vaccine (3 - 2024-25 season) 11/07/2022   Flu Shot  10/07/2023   Medicare Annual Wellness Visit  09/25/2024   DEXA scan (bone density measurement)  07/03/2025   Pneumococcal Vaccine for age over 20  Completed   Hepatitis B Vaccine  Aged Out   HPV Vaccine  Aged Out   Meningitis B Vaccine  Aged Out   Hepatitis C Screening  Discontinued    Advanced directives: (Provided) Advance directive discussed with you today. I have provided a copy for you to complete at home and have notarized. Once this is complete, please bring a copy in to our office so we can scan it into your chart.  Advance Care Planning is important because it:  [x]  Makes sure you receive the medical care that is  consistent with your values, goals, and preferences  [x]  It provides guidance to your family and loved ones and reduces their decisional burden about whether or not they are making the right decisions based on your wishes.  Follow the link provided in your after visit summary or read over the paperwork we have mailed to you to help you started getting your Advance Directives in place. If you need assistance in completing these, please reach out to us  so that we can help you!  Follow the link provided in your after visit summary or read over the paperwork we have mailed to you to help you started getting your Advance Directives in place. If you need assistance in completing these, please reach out to us  so that we can help you!  We will mail you an Advanced Directives packet as we discussed during your visit today. You do not have to have an attorney to complete this paperwork. Read over the packet, discuss it with family, and complete it. Choose who will be making decisions for you in the event you can no longer make them for yourself. Once completed have them notarized, and bring the packet back to the office. We will scan it and make sure it gets into your chart.   If you choose to have a DNR (Do Not Resuscitate Order) you must get this from your primary care  doctor because they have to sign it. You can get this in the office during an appointment.   THIS ORDER MUST BE VISIBLE AT ALL TIMES WITHIN YOUR HOME SUCH AS ON A REFRIGERATOR.

## 2023-09-27 ENCOUNTER — Other Ambulatory Visit: Payer: Self-pay | Admitting: Family Medicine

## 2023-09-27 DIAGNOSIS — R197 Diarrhea, unspecified: Secondary | ICD-10-CM

## 2023-10-02 DIAGNOSIS — G8253 Quadriplegia, C5-C7 complete: Secondary | ICD-10-CM | POA: Diagnosis not present

## 2023-10-02 DIAGNOSIS — G8250 Quadriplegia, unspecified: Secondary | ICD-10-CM | POA: Diagnosis not present

## 2023-10-05 ENCOUNTER — Encounter: Payer: Self-pay | Admitting: Family Medicine

## 2023-10-05 DIAGNOSIS — K6289 Other specified diseases of anus and rectum: Secondary | ICD-10-CM

## 2023-10-05 DIAGNOSIS — R32 Unspecified urinary incontinence: Secondary | ICD-10-CM

## 2023-10-06 MED ORDER — ZINC OXIDE 40 % EX PSTE
1.0000 | PASTE | Freq: Four times a day (QID) | CUTANEOUS | 3 refills | Status: AC | PRN
Start: 2023-10-06 — End: ?

## 2023-11-02 DIAGNOSIS — G8250 Quadriplegia, unspecified: Secondary | ICD-10-CM | POA: Diagnosis not present

## 2023-11-02 DIAGNOSIS — G8253 Quadriplegia, C5-C7 complete: Secondary | ICD-10-CM | POA: Diagnosis not present

## 2023-12-03 DIAGNOSIS — G8250 Quadriplegia, unspecified: Secondary | ICD-10-CM | POA: Diagnosis not present

## 2023-12-03 DIAGNOSIS — G8253 Quadriplegia, C5-C7 complete: Secondary | ICD-10-CM | POA: Diagnosis not present

## 2023-12-14 ENCOUNTER — Ambulatory Visit
Admission: RE | Admit: 2023-12-14 | Discharge: 2023-12-14 | Disposition: A | Discharge status: Home / Self Care | Source: Ambulatory Visit | Attending: Family Medicine | Admitting: Family Medicine

## 2023-12-14 DIAGNOSIS — Z1231 Encounter for screening mammogram for malignant neoplasm of breast: Secondary | ICD-10-CM | POA: Insufficient documentation

## 2023-12-21 ENCOUNTER — Encounter: Payer: Self-pay | Admitting: Family Medicine

## 2023-12-21 ENCOUNTER — Ambulatory Visit: Admitting: Family Medicine

## 2023-12-21 ENCOUNTER — Ambulatory Visit: Payer: Self-pay | Admitting: Family Medicine

## 2023-12-21 VITALS — BP 94/51 | HR 73 | Temp 97.9°F

## 2023-12-21 DIAGNOSIS — K5909 Other constipation: Secondary | ICD-10-CM

## 2023-12-21 DIAGNOSIS — Z23 Encounter for immunization: Secondary | ICD-10-CM

## 2023-12-21 DIAGNOSIS — J302 Other seasonal allergic rhinitis: Secondary | ICD-10-CM | POA: Diagnosis not present

## 2023-12-21 DIAGNOSIS — M8000XG Age-related osteoporosis with current pathological fracture, unspecified site, subsequent encounter for fracture with delayed healing: Secondary | ICD-10-CM

## 2023-12-21 DIAGNOSIS — R32 Unspecified urinary incontinence: Secondary | ICD-10-CM

## 2023-12-21 DIAGNOSIS — Z993 Dependence on wheelchair: Secondary | ICD-10-CM | POA: Diagnosis not present

## 2023-12-21 DIAGNOSIS — R197 Diarrhea, unspecified: Secondary | ICD-10-CM

## 2023-12-21 DIAGNOSIS — G8250 Quadriplegia, unspecified: Secondary | ICD-10-CM | POA: Diagnosis not present

## 2023-12-21 DIAGNOSIS — Z9189 Other specified personal risk factors, not elsewhere classified: Secondary | ICD-10-CM

## 2023-12-21 DIAGNOSIS — J432 Centrilobular emphysema: Secondary | ICD-10-CM | POA: Diagnosis not present

## 2023-12-21 NOTE — Patient Instructions (Addendum)
 Take docusate sodium 250 mg 1 tab once daily as needed for constipation.  - get 100 mg capsules if over-responsive to this.  Can also try bisacodyl 5 mg tablet daily as needed.  Can try allegra or xyzal for allergies - whichever allergy medication you use, take one tablet every night.

## 2023-12-21 NOTE — Progress Notes (Addendum)
 Established patient visit   Patient: Heidi Hess   DOB: 1960/07/12   63 y.o. Female  MRN: [id number] Visit Date: 12/21/2023  Today's healthcare provider: LAURAINE LOISE BUOY, DO   Chief Complaint  Patient presents with   Medical Management of Chronic Issues    Patient is here for a follow up  Flu vaccine received at drug store last week.   Subjective    HPI Heidi Hess is a 63 year old female who presents with chronic diarrhea.  She has experienced diarrhea for the past year. Recently, she took generic Imodium, starting with two pills on Friday, followed by another on Saturday morning, and one more in the evening, which resolved the symptoms. She has not had any diarrhea since Saturday.  She is concerned about constipation following the use of Imodium, as she has not had a bowel movement since Saturday.  She is considering using a suppository but is hesitant due to the potential for the diarrhea to 'start it all over again.' She has docusate sodium on her medication list, which she has not been taking regularly.  She endorses current rhinitis.  She experiences worsening symptoms when eating and is currently taking Claritin for allergies. She has tried Zyrtec without relief and has ordered a nasal spray, Astapro, to help with her nasal symptoms.      Medications: Outpatient Medications Prior to Visit  Medication Sig   Acetaminophen 325 MG CAPS    albuterol  (VENTOLIN  HFA) 108 (90 Base) MCG/ACT inhaler Inhale 2 puffs into the lungs every 6 (six) hours as needed for wheezing or shortness of breath.   Ascorbic Acid (VITAMIN C) 500 MG CHEW Chew 500 mg by mouth daily.   bisacodyl (DULCOLAX) 10 MG suppository Place 10 mg rectally daily.   budesonide -formoterol  (SYMBICORT ) 160-4.5 MCG/ACT inhaler Inhale 2 puffs into the lungs 2 (two) times daily.   Calcium  Carb-Cholecalciferol (OYSTER SHELL CALCIUM ) 500-400 MG-UNIT TABS    chlorpheniramine-HYDROcodone (TUSSIONEX) 10-8 MG/5ML  Take 5 mLs by mouth every 12 (twelve) hours as needed for cough.   Docusate Sodium (DSS) 250 MG CAPS    loperamide (IMODIUM) 2 MG capsule Take 2 mg by mouth as needed for diarrhea or loose stools.   loratadine (CLARITIN) 10 MG tablet Take 10 mg by mouth daily.   Multiple Vitamins-Minerals (CENTRUM ADULTS) TABS    Olopatadine  HCl 0.2 % SOLN Place 1 drop into both eyes daily at 2 PM.   rosuvastatin  (CRESTOR ) 5 MG tablet Take 1 tablet (5 mg total) by mouth daily.   saccharomyces boulardii (FLORASTOR) 250 MG capsule Take 1 capsule (250 mg total) by mouth 2 (two) times daily. Take at least two hours before or after antibiotics.   Simethicone 180 MG CAPS Take 180 mg by mouth daily.   Spacer/Aero-Holding Chambers DEVI 1 each by Does not apply route as directed. For use with inhalers   Vitamin D , Cholecalciferol, 25 MCG (1000 UT) TABS Take 5,000 Units by mouth daily at 2 PM.   Zinc  Oxide 40 % PSTE Apply 1 Application topically 4 (four) times daily as needed (Skin irritation).   [DISCONTINUED] baclofen  (LIORESAL ) 10 MG tablet Take 1 tablet (10 mg total) by mouth 3 (three) times daily.   [DISCONTINUED] colestipol  (COLESTID ) 1 g tablet Take 1 tablet (1 g total) by mouth 2 (two) times daily as needed.   No facility-administered medications prior to visit.    Review of Systems  Constitutional:  Negative for appetite change, chills, fatigue  and fever.  HENT:  Positive for rhinorrhea. Negative for congestion.   Respiratory:  Negative for chest tightness and shortness of breath.   Cardiovascular:  Negative for chest pain and palpitations.  Gastrointestinal:  Positive for constipation. Negative for abdominal pain, diarrhea (improved with immodium), nausea and vomiting.  Neurological:  Negative for dizziness and weakness.        Objective    BP (!) 94/51 (BP Location: Right Arm, Patient Position: Sitting, Cuff Size: Normal)   Pulse 73   Temp 97.9 F (36.6 C) (Oral)   SpO2 97%     Physical  Exam Constitutional:      Appearance: Normal appearance.  HENT:     Head: Normocephalic and atraumatic.  Eyes:     General: No scleral icterus.    Extraocular Movements: Extraocular movements intact.     Conjunctiva/sclera: Conjunctivae normal.  Cardiovascular:     Rate and Rhythm: Normal rate and regular rhythm.     Pulses: Normal pulses.     Heart sounds: Normal heart sounds.  Pulmonary:     Effort: Pulmonary effort is normal. No respiratory distress.     Breath sounds: Normal breath sounds.  Abdominal:     General: Bowel sounds are normal. There is no distension.     Palpations: Abdomen is soft. There is no mass.     Tenderness: There is no abdominal tenderness. There is no guarding.  Musculoskeletal:     Right lower leg: No edema.     Left lower leg: No edema.  Skin:    General: Skin is warm and dry.  Neurological:     Mental Status: She is alert and oriented to person, place, and time. Mental status is at baseline.  Psychiatric:        Mood and Affect: Mood normal.        Behavior: Behavior normal.      No results found for any visits on 12/21/23.  Assessment & Plan    Quadriplegia and quadriparesis (HCC) Assessment & Plan: Noted.  Patient in wheelchair and able to operate wheelchair on her own.  No acute concerns.  Continue to monitor.   Diarrhea, unspecified type  Seasonal allergic rhinitis, unspecified trigger  Hepatitis B vaccination administered at current visit -     Heplisav-B (HepB-CPG) Vaccine  Wheelchair dependence  Incontinence in female  At high risk for skin breakdown  Osteoporosis with pathological fracture with delayed healing, subsequent encounter Assessment & Plan: Following with endocrinology; defer to specialist management   Centrilobular emphysema [facility]) Assessment & Plan: Noted on CT chest 04/13/2023.  No acute concerns at this time.  Continue Symbicort  twice daily and albuterol  as needed.   Intermittent  constipation     Quadriplegia and quadriparesis; wheelchair dependence; incontinence in female Noted.  Patient in wheelchair and able to operate wheelchair on her own.  Patient uses incontinence supplies and has home aides to come in to help her during the day.  No acute concerns.  Continue to monitor.   Diarrhea, unspecified type Chronic diarrhea for a year, partially managed with Imodium. Missed gastroenterology appointment, rescheduled for December. - Continue Imodium as needed, caution against overuse. - Attend gastroenterology appointment in December.  Intermittent constipation Intermittent constipation. Docusate sodium available but not regularly used. - Take docusate sodium 250 mg once daily as needed for constipation. - Consider switching to 100 mg capsules if current dose causes over-response. - If considering a laxative, consider bisacodyl tablets, taking 1 tablet daily as needed, with  subsequent increase in dose only when needed (maximum 3 tablets daily). - Avoid suppositories unless necessary.  Seasonal allergic rhinitis, unspecified trigger Allergic rhinitis with limited relief from Claritin. Considering nasal spray and alternative antihistamines. - Try azelastine nasal spray. - Switch from Claritin to Allegra or Xyzal if symptoms persist.  Immunization counseling and administration Due for COVID-19 vaccination and hepatitis B series completion. - Patient will receive COVID-19 vaccine today at pharmacy. - Administer hepatitis B vaccine today and schedule second dose in one month.    Return in about 6 months (around 06/20/2024) for CPE, Chronic f/u.      I discussed the assessment and treatment plan with the patient  The patient was provided an opportunity to ask questions and all were answered. The patient agreed with the plan and demonstrated an understanding of the instructions.   The patient was advised to call back or seek an in-person evaluation if the symptoms  worsen or if the condition fails to improve as anticipated.    LAURAINE LOISE BUOY, DO  [facility] [phone] (phone) [phone] (fax)  [facility]

## 2023-12-21 NOTE — Assessment & Plan Note (Signed)
 Following with endocrinology; defer to specialist management

## 2023-12-21 NOTE — Assessment & Plan Note (Addendum)
 Noted.  Patient in wheelchair and able to operate wheelchair on her own.  No acute concerns.  Continue to monitor.

## 2023-12-21 NOTE — Assessment & Plan Note (Signed)
 Noted on CT chest 04/13/2023.  No acute concerns at this time.  Continue Symbicort  twice daily and albuterol  as needed.

## 2024-01-02 DIAGNOSIS — G8253 Quadriplegia, C5-C7 complete: Secondary | ICD-10-CM | POA: Diagnosis not present

## 2024-01-02 DIAGNOSIS — G8250 Quadriplegia, unspecified: Secondary | ICD-10-CM | POA: Diagnosis not present

## 2024-01-05 ENCOUNTER — Other Ambulatory Visit: Payer: Self-pay | Admitting: Family Medicine

## 2024-01-05 DIAGNOSIS — E7849 Other hyperlipidemia: Secondary | ICD-10-CM

## 2024-01-19 ENCOUNTER — Ambulatory Visit: Admitting: Podiatry

## 2024-01-23 ENCOUNTER — Encounter: Payer: Self-pay | Admitting: Family Medicine

## 2024-01-23 DIAGNOSIS — Z993 Dependence on wheelchair: Secondary | ICD-10-CM

## 2024-01-23 DIAGNOSIS — G8250 Quadriplegia, unspecified: Secondary | ICD-10-CM

## 2024-01-23 DIAGNOSIS — R32 Unspecified urinary incontinence: Secondary | ICD-10-CM

## 2024-01-23 DIAGNOSIS — R197 Diarrhea, unspecified: Secondary | ICD-10-CM

## 2024-01-23 DIAGNOSIS — R238 Other skin changes: Secondary | ICD-10-CM

## 2024-01-23 DIAGNOSIS — Z9189 Other specified personal risk factors, not elsewhere classified: Secondary | ICD-10-CM

## 2024-01-25 ENCOUNTER — Ambulatory Visit: Admitting: Family Medicine

## 2024-01-26 ENCOUNTER — Telehealth: Payer: Self-pay

## 2024-01-26 NOTE — Telephone Encounter (Signed)
 Copied from CRM #[id number]. Topic: General - Other >> Jan 26, 2024  3:37 PM [name] wrote: Reason for CRM: Per Home Health care agency pt will need an office visit indicating need for services before insurance will cover

## 2024-02-01 NOTE — Telephone Encounter (Signed)
 Spoke with patient and she said the wound was almost healed up and she didn't need homecare any longer.   She is coming 02/24/24 for the 2nd hep B vaccine per patient.

## 2024-02-24 ENCOUNTER — Ambulatory Visit: Admitting: Family Medicine

## 2024-02-24 DIAGNOSIS — Z23 Encounter for immunization: Secondary | ICD-10-CM

## 2024-02-24 DIAGNOSIS — Z789 Other specified health status: Secondary | ICD-10-CM

## 2024-02-24 NOTE — Progress Notes (Signed)
 Patient is present for 2nd and final dose of hepatitis b vaccine. Injection was given in left deltoid, patient tolerated injection well.

## 2024-02-24 NOTE — Addendum Note (Signed)
 Addended by: CHERRY CHIQUITA HERO on: 02/24/2024 03:54 PM   Modules accepted: Orders

## 2024-02-25 LAB — LIPID PANEL
Chol/HDL Ratio: 3.6 ratio (ref 0.0–4.4)
Cholesterol, Total: 111 mg/dL (ref 100–199)
HDL: 31 mg/dL — ABNORMAL LOW
LDL Chol Calc (NIH): 54 mg/dL (ref 0–99)
Triglycerides: 149 mg/dL (ref 0–149)
VLDL Cholesterol Cal: 26 mg/dL (ref 5–40)

## 2024-03-09 ENCOUNTER — Other Ambulatory Visit: Payer: Self-pay | Admitting: Family Medicine

## 2024-03-09 DIAGNOSIS — Z2989 Encounter for other specified prophylactic measures: Secondary | ICD-10-CM

## 2024-03-09 DIAGNOSIS — E7849 Other hyperlipidemia: Secondary | ICD-10-CM

## 2024-03-15 ENCOUNTER — Ambulatory Visit: Payer: Self-pay | Admitting: Family Medicine

## 2024-05-31 ENCOUNTER — Ambulatory Visit: Admitting: Podiatry

## 2024-06-20 ENCOUNTER — Encounter: Admitting: Family Medicine

## 2024-06-22 ENCOUNTER — Encounter: Admitting: Family Medicine

## 2024-09-26 ENCOUNTER — Ambulatory Visit
